# Patient Record
Sex: Female | Born: 1985
Health system: Southern US, Community
[De-identification: ages and names within clinical notes are randomized; demographics above are authoritative.]

## PROBLEM LIST (undated history)

## (undated) ENCOUNTER — Emergency Department (HOSPITAL_COMMUNITY): Payer: Medicaid Other

## (undated) DIAGNOSIS — C801 Malignant (primary) neoplasm, unspecified: Secondary | ICD-10-CM

## (undated) DIAGNOSIS — N3010 Interstitial cystitis (chronic) without hematuria: Secondary | ICD-10-CM

## (undated) DIAGNOSIS — F419 Anxiety disorder, unspecified: Secondary | ICD-10-CM

## (undated) DIAGNOSIS — Z8619 Personal history of other infectious and parasitic diseases: Secondary | ICD-10-CM

## (undated) DIAGNOSIS — IMO0002 Reserved for concepts with insufficient information to code with codable children: Secondary | ICD-10-CM

## (undated) DIAGNOSIS — N1370 Vesicoureteral-reflux, unspecified: Secondary | ICD-10-CM

## (undated) DIAGNOSIS — F410 Panic disorder [episodic paroxysmal anxiety] without agoraphobia: Secondary | ICD-10-CM

## (undated) DIAGNOSIS — T7840XA Allergy, unspecified, initial encounter: Secondary | ICD-10-CM

## (undated) DIAGNOSIS — T148XXA Other injury of unspecified body region, initial encounter: Secondary | ICD-10-CM

## (undated) DIAGNOSIS — F32A Depression, unspecified: Secondary | ICD-10-CM

## (undated) DIAGNOSIS — N390 Urinary tract infection, site not specified: Secondary | ICD-10-CM

## (undated) DIAGNOSIS — F4310 Post-traumatic stress disorder, unspecified: Secondary | ICD-10-CM

## (undated) DIAGNOSIS — R87619 Unspecified abnormal cytological findings in specimens from cervix uteri: Secondary | ICD-10-CM

## (undated) DIAGNOSIS — F429 Obsessive-compulsive disorder, unspecified: Secondary | ICD-10-CM

## (undated) DIAGNOSIS — N3289 Other specified disorders of bladder: Secondary | ICD-10-CM

## (undated) DIAGNOSIS — N730 Acute parametritis and pelvic cellulitis: Secondary | ICD-10-CM

## (undated) DIAGNOSIS — F329 Major depressive disorder, single episode, unspecified: Secondary | ICD-10-CM

## (undated) DIAGNOSIS — O039 Complete or unspecified spontaneous abortion without complication: Secondary | ICD-10-CM

## (undated) HISTORY — DX: Acute parametritis and pelvic cellulitis: N73.0

## (undated) HISTORY — DX: Urinary tract infection, site not specified: N39.0

## (undated) HISTORY — DX: Post-traumatic stress disorder, unspecified: F43.10

## (undated) HISTORY — PX: MELANOMA EXCISION: SHX5266

## (undated) HISTORY — DX: Depression, unspecified: F32.A

## (undated) HISTORY — DX: Malignant (primary) neoplasm, unspecified: C80.1

## (undated) HISTORY — DX: Obsessive-compulsive disorder, unspecified: F42.9

## (undated) HISTORY — DX: Personal history of other infectious and parasitic diseases: Z86.19

## (undated) HISTORY — DX: Interstitial cystitis (chronic) without hematuria: N30.10

## (undated) HISTORY — DX: Vesicoureteral-reflux, unspecified: N13.70

## (undated) HISTORY — DX: Allergy, unspecified, initial encounter: T78.40XA

## (undated) HISTORY — DX: Complete or unspecified spontaneous abortion without complication: O03.9

## (undated) HISTORY — PX: DILATION AND CURETTAGE OF UTERUS: SHX78

## (undated) HISTORY — DX: Other injury of unspecified body region, initial encounter: T14.8XXA

## (undated) HISTORY — DX: Anxiety disorder, unspecified: F41.9

## (undated) HISTORY — DX: Unspecified abnormal cytological findings in specimens from cervix uteri: R87.619

## (undated) HISTORY — DX: Major depressive disorder, single episode, unspecified: F32.9

## (undated) HISTORY — PX: LAPAROSCOPY: SHX197

## (undated) HISTORY — PX: TONSILLECTOMY: SUR1361

## (undated) HISTORY — DX: Reserved for concepts with insufficient information to code with codable children: IMO0002

---
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
## 2003-03-12 ENCOUNTER — Other Ambulatory Visit: Admission: RE | Admit: 2003-03-12 | Discharge: 2003-03-12 | Payer: Self-pay | Admitting: Obstetrics and Gynecology

## 2003-05-07 ENCOUNTER — Inpatient Hospital Stay (HOSPITAL_COMMUNITY): Admission: AD | Admit: 2003-05-07 | Discharge: 2003-05-10 | Payer: Self-pay | Admitting: Obstetrics and Gynecology

## 2003-05-08 ENCOUNTER — Encounter: Payer: Self-pay | Admitting: Obstetrics and Gynecology

## 2003-05-23 ENCOUNTER — Inpatient Hospital Stay (HOSPITAL_COMMUNITY): Admission: AD | Admit: 2003-05-23 | Discharge: 2003-05-23 | Payer: Self-pay | Admitting: Obstetrics and Gynecology

## 2003-05-25 ENCOUNTER — Inpatient Hospital Stay (HOSPITAL_COMMUNITY): Admission: AD | Admit: 2003-05-25 | Discharge: 2003-06-01 | Payer: Self-pay | Admitting: Obstetrics and Gynecology

## 2003-07-23 ENCOUNTER — Inpatient Hospital Stay (HOSPITAL_COMMUNITY): Admission: AD | Admit: 2003-07-23 | Discharge: 2003-07-23 | Payer: Self-pay | Admitting: Obstetrics and Gynecology

## 2003-07-23 ENCOUNTER — Encounter: Payer: Self-pay | Admitting: Obstetrics and Gynecology

## 2003-08-01 ENCOUNTER — Other Ambulatory Visit: Admission: RE | Admit: 2003-08-01 | Discharge: 2003-08-01 | Payer: Self-pay | Admitting: Obstetrics and Gynecology

## 2004-02-19 ENCOUNTER — Ambulatory Visit (HOSPITAL_COMMUNITY): Admission: RE | Admit: 2004-02-19 | Discharge: 2004-02-19 | Payer: Self-pay | Admitting: Urology

## 2004-05-12 ENCOUNTER — Other Ambulatory Visit: Admission: RE | Admit: 2004-05-12 | Discharge: 2004-05-12 | Payer: Self-pay | Admitting: Obstetrics and Gynecology

## 2004-06-13 ENCOUNTER — Ambulatory Visit (HOSPITAL_COMMUNITY): Admission: RE | Admit: 2004-06-13 | Discharge: 2004-06-13 | Payer: Self-pay | Admitting: Urology

## 2004-06-13 ENCOUNTER — Ambulatory Visit (HOSPITAL_BASED_OUTPATIENT_CLINIC_OR_DEPARTMENT_OTHER): Admission: RE | Admit: 2004-06-13 | Discharge: 2004-06-13 | Payer: Self-pay | Admitting: Urology

## 2004-06-13 ENCOUNTER — Encounter (INDEPENDENT_AMBULATORY_CARE_PROVIDER_SITE_OTHER): Payer: Self-pay | Admitting: *Deleted

## 2005-06-18 ENCOUNTER — Other Ambulatory Visit: Admission: RE | Admit: 2005-06-18 | Discharge: 2005-06-18 | Payer: Self-pay | Admitting: Obstetrics and Gynecology

## 2005-10-30 ENCOUNTER — Ambulatory Visit: Payer: Self-pay | Admitting: Family Medicine

## 2005-11-25 ENCOUNTER — Ambulatory Visit: Payer: Self-pay | Admitting: Cardiology

## 2005-12-14 ENCOUNTER — Ambulatory Visit: Payer: Self-pay | Admitting: Family Medicine

## 2006-02-12 ENCOUNTER — Ambulatory Visit: Payer: Self-pay | Admitting: Family Medicine

## 2006-03-02 ENCOUNTER — Ambulatory Visit: Payer: Self-pay | Admitting: Family Medicine

## 2006-07-28 ENCOUNTER — Ambulatory Visit: Payer: Self-pay | Admitting: Family Medicine

## 2006-12-28 ENCOUNTER — Ambulatory Visit: Payer: Self-pay | Admitting: Family Medicine

## 2007-03-22 ENCOUNTER — Encounter: Payer: Self-pay | Admitting: Family Medicine

## 2007-03-22 ENCOUNTER — Ambulatory Visit: Payer: Self-pay | Admitting: Family Medicine

## 2007-03-22 DIAGNOSIS — J309 Allergic rhinitis, unspecified: Secondary | ICD-10-CM | POA: Insufficient documentation

## 2007-03-22 DIAGNOSIS — F329 Major depressive disorder, single episode, unspecified: Secondary | ICD-10-CM | POA: Insufficient documentation

## 2007-03-22 DIAGNOSIS — Z85828 Personal history of other malignant neoplasm of skin: Secondary | ICD-10-CM | POA: Insufficient documentation

## 2007-04-12 ENCOUNTER — Telehealth (INDEPENDENT_AMBULATORY_CARE_PROVIDER_SITE_OTHER): Payer: Self-pay | Admitting: *Deleted

## 2007-04-13 ENCOUNTER — Ambulatory Visit: Payer: Self-pay | Admitting: Family Medicine

## 2007-06-02 ENCOUNTER — Telehealth (INDEPENDENT_AMBULATORY_CARE_PROVIDER_SITE_OTHER): Payer: Self-pay | Admitting: *Deleted

## 2007-06-03 ENCOUNTER — Ambulatory Visit: Payer: Self-pay | Admitting: Internal Medicine

## 2007-06-12 ENCOUNTER — Emergency Department: Payer: Self-pay | Admitting: Unknown Physician Specialty

## 2007-07-18 ENCOUNTER — Ambulatory Visit: Payer: Self-pay | Admitting: Family Medicine

## 2007-08-15 ENCOUNTER — Telehealth (INDEPENDENT_AMBULATORY_CARE_PROVIDER_SITE_OTHER): Payer: Self-pay | Admitting: *Deleted

## 2007-08-16 ENCOUNTER — Encounter: Payer: Self-pay | Admitting: Family Medicine

## 2007-08-26 ENCOUNTER — Ambulatory Visit: Payer: Self-pay | Admitting: Family Medicine

## 2007-08-26 LAB — CONVERTED CEMR LAB
Bilirubin Urine: NEGATIVE
Urobilinogen, UA: 0.2
WBC Urine, dipstick: NEGATIVE
pH: 6.5

## 2007-08-27 ENCOUNTER — Encounter: Payer: Self-pay | Admitting: Family Medicine

## 2007-12-06 ENCOUNTER — Ambulatory Visit: Payer: Self-pay | Admitting: Family Medicine

## 2008-01-04 ENCOUNTER — Ambulatory Visit: Payer: Self-pay | Admitting: Family Medicine

## 2008-01-04 ENCOUNTER — Other Ambulatory Visit: Admission: RE | Admit: 2008-01-04 | Discharge: 2008-01-04 | Payer: Self-pay | Admitting: Family Medicine

## 2008-01-04 ENCOUNTER — Encounter: Payer: Self-pay | Admitting: Family Medicine

## 2008-03-19 ENCOUNTER — Ambulatory Visit: Payer: Self-pay | Admitting: Family Medicine

## 2008-04-12 ENCOUNTER — Ambulatory Visit: Payer: Self-pay | Admitting: Family Medicine

## 2008-04-12 LAB — CONVERTED CEMR LAB: Rapid Strep: NEGATIVE

## 2008-04-23 ENCOUNTER — Telehealth (INDEPENDENT_AMBULATORY_CARE_PROVIDER_SITE_OTHER): Payer: Self-pay | Admitting: *Deleted

## 2008-04-24 ENCOUNTER — Ambulatory Visit: Payer: Self-pay | Admitting: Family Medicine

## 2008-05-03 ENCOUNTER — Ambulatory Visit: Payer: Self-pay | Admitting: Family Medicine

## 2008-05-03 LAB — CONVERTED CEMR LAB: KOH Prep: NEGATIVE

## 2008-05-07 LAB — CONVERTED CEMR LAB: GC Probe Amp, Genital: NEGATIVE

## 2008-08-20 ENCOUNTER — Ambulatory Visit: Payer: Self-pay | Admitting: Family Medicine

## 2008-09-17 LAB — CONVERTED CEMR LAB: GC Probe Amp, Genital: NEGATIVE

## 2008-12-12 ENCOUNTER — Encounter: Payer: Self-pay | Admitting: Family Medicine

## 2009-01-29 ENCOUNTER — Ambulatory Visit: Payer: Self-pay | Admitting: Family Medicine

## 2009-04-25 ENCOUNTER — Ambulatory Visit: Payer: Self-pay | Admitting: Family Medicine

## 2009-04-25 LAB — CONVERTED CEMR LAB: Rapid Strep: NEGATIVE

## 2009-05-06 ENCOUNTER — Ambulatory Visit: Payer: Self-pay | Admitting: Family Medicine

## 2009-05-06 LAB — CONVERTED CEMR LAB
Beta hcg, urine, semiquantitative: NEGATIVE
Glucose, Urine, Semiquant: NEGATIVE
Ketones, urine, test strip: NEGATIVE
Nitrite: NEGATIVE
Protein, U semiquant: 30

## 2009-05-08 LAB — CONVERTED CEMR LAB: Chlamydia, DNA Probe: NEGATIVE

## 2009-05-21 ENCOUNTER — Ambulatory Visit: Payer: Self-pay | Admitting: Family Medicine

## 2009-06-03 ENCOUNTER — Ambulatory Visit: Payer: Self-pay | Admitting: Family Medicine

## 2009-06-28 ENCOUNTER — Ambulatory Visit: Payer: Self-pay | Admitting: Family Medicine

## 2009-06-28 LAB — CONVERTED CEMR LAB: Rapid Strep: NEGATIVE

## 2009-08-26 ENCOUNTER — Encounter: Payer: Self-pay | Admitting: Family Medicine

## 2009-08-26 ENCOUNTER — Ambulatory Visit: Payer: Self-pay | Admitting: Family Medicine

## 2009-08-27 ENCOUNTER — Telehealth (INDEPENDENT_AMBULATORY_CARE_PROVIDER_SITE_OTHER): Payer: Self-pay | Admitting: *Deleted

## 2009-09-30 ENCOUNTER — Ambulatory Visit: Payer: Self-pay | Admitting: Internal Medicine

## 2009-09-30 LAB — CONVERTED CEMR LAB
Bilirubin Urine: NEGATIVE
Glucose, Urine, Semiquant: NEGATIVE
Ketones, urine, test strip: NEGATIVE
Protein, U semiquant: NEGATIVE
Specific Gravity, Urine: 1.02

## 2009-12-10 ENCOUNTER — Ambulatory Visit: Payer: Self-pay | Admitting: Family Medicine

## 2009-12-10 LAB — CONVERTED CEMR LAB
Basophils Absolute: 0 10*3/uL (ref 0.0–0.1)
Eosinophils Relative: 0.3 % (ref 0.0–5.0)
Monocytes Absolute: 0.3 10*3/uL (ref 0.1–1.0)
Monocytes Relative: 7.8 % (ref 3.0–12.0)
Neutrophils Relative %: 64.2 % (ref 43.0–77.0)
Platelets: 223 10*3/uL (ref 150.0–400.0)
WBC: 3.7 10*3/uL — ABNORMAL LOW (ref 4.5–10.5)

## 2010-01-24 ENCOUNTER — Ambulatory Visit: Payer: Self-pay | Admitting: Family Medicine

## 2010-01-24 LAB — CONVERTED CEMR LAB
Beta hcg, urine, semiquantitative: NEGATIVE
Bilirubin Urine: NEGATIVE
Glucose, Urine, Semiquant: NEGATIVE
KOH Prep: NEGATIVE
Ketones, urine, test strip: NEGATIVE
Specific Gravity, Urine: 1.015
Urobilinogen, UA: 0.2
Whiff Test: NEGATIVE
Yeast, UA: 0
pH: 6

## 2010-01-25 ENCOUNTER — Encounter: Payer: Self-pay | Admitting: Family Medicine

## 2010-03-17 ENCOUNTER — Ambulatory Visit: Payer: Self-pay | Admitting: Family Medicine

## 2010-03-19 ENCOUNTER — Telehealth: Payer: Self-pay | Admitting: Family Medicine

## 2010-03-19 ENCOUNTER — Encounter: Payer: Self-pay | Admitting: Family Medicine

## 2010-04-15 ENCOUNTER — Encounter: Payer: Self-pay | Admitting: Family Medicine

## 2010-06-03 ENCOUNTER — Ambulatory Visit: Payer: Self-pay | Admitting: Family Medicine

## 2010-06-03 LAB — CONVERTED CEMR LAB: Rapid Strep: NEGATIVE

## 2010-06-25 ENCOUNTER — Encounter (INDEPENDENT_AMBULATORY_CARE_PROVIDER_SITE_OTHER): Payer: Self-pay | Admitting: *Deleted

## 2010-06-25 ENCOUNTER — Ambulatory Visit: Payer: Self-pay | Admitting: Family Medicine

## 2010-06-25 DIAGNOSIS — N3010 Interstitial cystitis (chronic) without hematuria: Secondary | ICD-10-CM | POA: Insufficient documentation

## 2010-06-25 LAB — CONVERTED CEMR LAB
Ketones, urine, test strip: NEGATIVE
Nitrite: NEGATIVE
RBC / HPF: 0
Urobilinogen, UA: 0.2
WBC Urine, dipstick: NEGATIVE

## 2010-06-26 ENCOUNTER — Encounter: Payer: Self-pay | Admitting: Family Medicine

## 2010-06-26 LAB — CONVERTED CEMR LAB
Chlamydia, DNA Probe: NEGATIVE
GC Probe Amp, Genital: NEGATIVE

## 2010-07-22 ENCOUNTER — Ambulatory Visit: Payer: Self-pay | Admitting: Family Medicine

## 2010-08-21 ENCOUNTER — Ambulatory Visit: Payer: Self-pay | Admitting: Internal Medicine

## 2010-08-21 ENCOUNTER — Encounter: Payer: Self-pay | Admitting: Family Medicine

## 2010-09-23 ENCOUNTER — Ambulatory Visit: Payer: Self-pay | Admitting: Family Medicine

## 2010-09-23 LAB — CONVERTED CEMR LAB
Casts: 0 /lpf
Glucose, Urine, Semiquant: NEGATIVE
Nitrite: NEGATIVE
Specific Gravity, Urine: 1.02
Yeast, UA: 0
pH: 6

## 2010-09-24 ENCOUNTER — Encounter: Payer: Self-pay | Admitting: Family Medicine

## 2010-11-20 ENCOUNTER — Ambulatory Visit
Admission: RE | Admit: 2010-11-20 | Discharge: 2010-11-20 | Payer: Self-pay | Source: Home / Self Care | Attending: Family Medicine | Admitting: Family Medicine

## 2010-11-20 ENCOUNTER — Encounter: Payer: Self-pay | Admitting: Family Medicine

## 2010-12-23 ENCOUNTER — Encounter: Payer: Self-pay | Admitting: Family Medicine

## 2010-12-23 NOTE — Letter (Signed)
Summary: Out of Work  [facility]  [address] [place], [place] [address]   Phone: [phone]  Fax: [phone]    August 21, 2010   Employee:  EVANA RUNNELS    To Whom It May Concern:   For Medical reasons, please excuse the above named employee from work for the following dates:  Start:  August 21, 2010   End:  August 21, 2010   If you need additional information, please feel free to contact our office.         Sincerely,    Eustaquio Boyden  MD

## 2010-12-23 NOTE — Letter (Signed)
Summary: Out of Work  [facility]  [address] [place], [place] [address]   Phone: [phone]  Fax: [phone]    June 03, 2010   Employee:  GIOVANNA KEMMERER    To Whom It May Concern:   For Medical reasons, please excuse the above named employee from work for the following dates:  Start:   06/03/2010  End:   can return 7/13/ 2011 if feeling better   If you need additional information, please feel free to contact our office.         Sincerely,    Judith Part MD

## 2010-12-23 NOTE — Assessment & Plan Note (Signed)
Summary: ST,HA/CLE   Vital Signs:  Patient profile:   25 year old female Height:      68 inches Weight:      155.75 pounds BMI:     23.77 Temp:     97.9 degrees F oral Pulse rate:   84 / minute Pulse rhythm:   regular BP sitting:   106 / 72  (left arm) Cuff size:   regular  Vitals Entered By: Lewanda Rife LPN (June 03, 2010 12:04 PM) CC: headache, sorethroat, face hurts and productive cough with yellow and green mucus   History of Present Illness: last sunday started getting sick with sneezing and bad throat pain (10 days ago) tried mucinex dm and alka selzer severe  now is getting sinus pain under her L eye  no fever  green nasal d/c with bad cough that is slt productive  no wheezing   some nausea last week- that is better  is still a non smoker    Allergies: 1)  ! Paxil Cr (Paroxetine Hcl) 2)  ! Zyrtec Allergy 3)  ! 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 Eyes:  Denies blurring and eye irritation. ENT:  Complains of earache, nasal congestion, postnasal drainage, sinus pressure, and sore throat. CV:  Denies chest pain or discomfort and palpitations. Resp:  Complains of cough and sputum productive; denies pleuritic, shortness of breath, and wheezing. GI:   Complains of nausea; denies diarrhea and vomiting. Derm:  Denies lesion(s), poor wound healing, and rash. Heme:  Denies enlarge lymph nodes.  Physical Exam  General:  fatigued but well appearing female Head:  normocephalic, atraumatic, and no abnormalities observed.  bilat ethmoid and L maxillary sinus tenderness Eyes:  vision grossly intact, pupils equal, pupils round, pupils reactive to light, and no injection.   Ears:  R ear normal and L ear normal.   Nose:  nares congested and injected bilat  Mouth:  pharynx pink and moist, no erythema, and no exudates.  some clear post nasal drip noted  Neck:  No deformities, masses, or tenderness noted. Lungs:  Normal respiratory effort, chest expands symmetrically. Lungs are clear to auscultation, no crackles or wheezes. Heart:  Normal rate and regular rhythm. S1 and S2 normal without gallop, murmur, click, rub or other extra sounds. Skin:  Intact without suspicious lesions or rashes Cervical Nodes:  No lymphadenopathy noted Psych:  normal affect, talkative and pleasant    Impression & Recommendations:  Problem # 1:  SINUSITIS - ACUTE-NOS (ICD-461.9) Assessment New  10 days s/p uri with congestion and facial pain bad sore throat from drainage- rapid strep neg will tx with augmentin (warnings given about interaction with oc) recommend sympt care- see pt instructions   pt advised to update me if symptoms worsen or do not improve - esp facial pain Her updated medication  list for this problem includes:    Augmentin 875-125 Mg Tabs (Amoxicillin-pot clavulanate) .Marland Kitchen... 1 by mouth two times a day for 10 days  Orders: Prescription Created Electronically [phone]) Rapid Strep (60[address])  Complete Medication List: 1)  Elmiron 100 Mg Caps (Pentosan polysulfate sodium) .... Take 2 by mouth at bedtime 2)  Hydroxyzine Hcl 25 Mg Tabs (Hydroxyzine hcl) .... Take one by mouth daily 3)  Seasonique 0.15-0.03 &0.01 Mg Tabs (Levonorgest-eth estrad 91-day) .... As  directed 4)  Ultram 50 Mg Tabs (Tramadol hcl) .Marland Kitchen.. 1 by mouth up to three times a day as needed pain 5)  Augmentin 875-125 Mg Tabs (Amoxicillin-pot clavulanate) .Marland Kitchen.. 1 by mouth two times a day for 10 days  Patient Instructions: 1)  take the augmentin for sinus infection 2)  you can try mucinex over the counter twice daily as directed and nasal saline spray for congestion 3)  tylenol over the counter as directed may help with aches, headache and fever 4)  call if symptoms worsen or if not improved in 4-5 days  5)  drink lots of fluids and get rest  6)  I sent your px to the pharmacy Prescriptions: AUGMENTIN 875-125 MG TABS (AMOXICILLIN-POT CLAVULANATE) 1 by mouth two times a day for 10 days  #20 x 0   Entered and Authorized by:   Judith Part MD   Signed by:   Judith Part MD on 06/03/2010   Method used:   Electronically to        [facility]* (retail)       [address]       [place], [place]  [address]       Ph: [phone]       Fax: [phone]   RxID:   [id number]   Current Allergies (reviewed today): ! PAXIL CR (PAROXETINE HCL) ! ZYRTEC ALLERGY ! * Evangeline Dakin  Laboratory Results  Date/Time Received: June 03, 2010 12:05 PM  Date/Time Reported: June 03, 2010 12:05 PM   Other Tests  Rapid Strep: negative  Kit Test Internal QC: Positive   (Normal Range: Negative)

## 2010-12-23 NOTE — Assessment & Plan Note (Signed)
Summary: COUGH/CLE   Vital Signs:  Patient profile:   25 year old female Weight:      153.75 pounds Temp:     98.1 degrees F oral Pulse rate:   116 / minute Pulse rhythm:   regular BP sitting:   122 / 78  (left arm) Cuff size:   regular  Vitals Entered By: Selena Batten Dance CMA Duncan Dull) (August 21, 2010 3:25 PM) CC: Cough/sinus drainage x4 days   History of Present Illness: CC: cough/sinus drainage  4d h/o ear pain, mainly left side. Cough started 2-3 days ago - worse at night and early AM.  + fever to 101.  + sinus pressure and congestion, clear nasal drainage.  Bending head forward makes pain worse.  + ST.  + vomiting this am from severe coughing.  + dizzy from sinuses.  Has tried tylenol sinus.    Works with toddlers.  + mom smokes at home.  + sinus infections frequently.  + sinus issues in past, + allergic rhinitis.  no asthma.  No stool changes.  Staying well hydrated.    treated last month for sinus infection with augmentin x 10 days.  felt better.    Current Medications (verified): 1)  Elmiron 100 Mg Caps (Pentosan Polysulfate Sodium) .... Take 2 By Mouth At Bedtime 2)  Hydroxyzine Hcl 25 Mg Tabs (Hydroxyzine Hcl) .... Take One By Mouth Daily 3)  Seasonique 0.15-0.03 &0.01 Mg Tabs (Levonorgest-Eth Estrad 91-Day) .... As Directed  Allergies: 1)  ! Paxil Cr (Paroxetine Hcl) 2)  ! Zyrtec Allergy 3)  ! 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   Eyes:  vision grossly intact, pupils equal, pupils round, and pupils reactive to light.   Ears:  R ear normal and L ear normal.   Nose:  nares are injected and congested bilaterally, clear discharge Mouth:  post erythema and clear post nasal drip Neck:  R AC LAD Lungs:  Normal respiratory effort, chest expands symmetrically. Lungs are clear to auscultation, no crackles or wheezes. Heart:  Normal rate and regular rhythm. S1 and S2 normal without gallop, murmur, click, rub or other extra sounds. Abdomen:  Bowel sounds positive,abdomen soft and non-tender without masses, organomegaly or hernias noted. Pulses:  2+ rad pulses Extremities:  no edema Skin:  Intact without suspicious lesions or rashes   Impression & Recommendations:  Problem # 1:  SINUSITIS - ACUTE-NOS (ICD-461.9) Instructed on treatment. Call if symptoms persist or worsen.  longer course of augmentin (2wks).  rpt sinusitis.  red flags to return discussed.  discussed decreased effectiveness of OCPs while on abx, to use 2nd form of BC.  The following medications were removed from the medication list:    Augmentin 875-125 Mg Tabs (Amoxicillin-pot clavulanate) .Marland Kitchen... 1 by mouth two times a day  for 10 days Her updated medication list for this problem includes:    Augmentin 875-125 Mg Tabs (Amoxicillin-pot clavulanate) .Marland Kitchen... Take one by mouth two times a day  Complete Medication List: 1)  Elmiron 100 Mg Caps (Pentosan polysulfate sodium) .... Take 2 by mouth at bedtime 2)  Hydroxyzine Hcl 25 Mg Tabs (Hydroxyzine hcl) .... Take one by mouth daily 3)  Seasonique 0.15-0.03 &0.01 Mg Tabs (Levonorgest-eth estrad 91-day) .... As directed 4)  Augmentin 875-125 Mg Tabs (Amoxicillin-pot clavulanate) .... Take  one by mouth two times a day  Patient Instructions: 1)  You have a sinus infection. 2)  Take medicines as prescribed:  Augmentin twice daily for 2 weeks. 3)  Take guaifenesin 400mg  IR 1 1/2 pills in am and at noon with plenty of fluid to help mobilize mucous. 4)  Use nasal saline spray or neti pot to help drainage of sinuses. 5)  If you start having fevers >101.5, trouble swallowing or breathing, or are worsening instead of improving as expected, you may need to be seen again. 6)  Good to see you today, call clinic with questions.  Prescriptions: AUGMENTIN 875-125 MG TABS (AMOXICILLIN-POT CLAVULANATE) take one by mouth two times a day  #28 x 0   Entered and Authorized by:   Eustaquio Boyden  MD   Signed by:   Eustaquio Boyden  MD on 08/21/2010   Method used:   Electronically to        [facility]* (retail)       [address]       [place], [place]  [address]       Ph: [phone]       Fax: [phone]   RxID:   [id number]   Current Allergies (reviewed today): ! PAXIL CR (PAROXETINE HCL) ! ZYRTEC ALLERGY ! * TUSSINEX

## 2010-12-23 NOTE — Assessment & Plan Note (Signed)
Summary: 10:45 FEVER,BODY ACHES/CLE   Vital Signs:  Patient profile:   25 year old female Weight:      159 pounds Temp:     98.1 degrees F oral Pulse rate:   88 / minute Pulse rhythm:   regular BP sitting:   100 / 62  (left arm) Cuff size:   regular  Vitals Entered By: Lowella Petties CMA (December 10, 2009 11:05 AM) CC: Fevers, achy, sore throat, nausea x 3 days.  Has rash on hands and feet.  She has been taking amox x 1 week for bacterial infection.   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 if flu   did not get flu shot this year        Allergies: 1)  ! Paxil Cr (Paroxetine Hcl) 2)  ! Zyrtec Allergy 3)  ! 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 Eyes:  Denies blurring and discharge. ENT:   Complains of nasal congestion, postnasal drainage, sinus pressure, and sore throat; denies earache. CV:  Denies chest pain or discomfort and palpitations. Resp:  Complains of cough; denies pleuritic, shortness of breath, sputum productive, and wheezing. GI:  Complains of nausea; denies abdominal pain and diarrhea. GU:  Denies dysuria, hematuria, and urinary frequency. MS:  Complains of muscle aches; denies joint redness and joint swelling. Derm:  Complains of rash; denies itching. Endo:  Denies excessive thirst and excessive urination. Heme:  Denies abnormal bruising, bleeding, and enlarge lymph nodes.  Physical Exam  General:  fatigued and tearful - easily consoled  Head:  normocephalic, atraumatic, and no abnormalities observed.  no sinus tenderness  Eyes:  vision grossly intact, pupils equal, pupils round, pupils reactive to light, and no injection.   Ears:  R ear normal and L ear normal.   Nose:  nares congested with clear rhinorrhea  Mouth:  MMM, throat clear no petichiae or mucosal change  Neck:  No deformities, masses, or tenderness noted. supple/ no meningeal signs  Chest Wall:  No deformities, masses, or tenderness noted. Lungs:  Normal respiratory effort, chest expands symmetrically. Lungs are clear to auscultation, no crackles or wheezes. upper airway congestion noted  Heart:  Normal rate and regular rhythm. S1 and S2 normal without gallop, murmur,  click, rub or other extra sounds. Abdomen:  Bowel sounds positive,abdomen soft and non-tender without masses, organomegaly or hernias noted.  Msk:  No deformity or scoliosis noted of thoracic or lumbar spine.  no acute joint changes  Extremities:  No clubbing, cyanosis, edema, or deformity noted with normal full range of motion of all joints.   Skin:  faint petichial type rash -fingers and toes no other rash Cervical Nodes:  No lymphadenopathy noted Inguinal Nodes:  No significant adenopathy Psych:  normal affect, talkative and  pleasant    Impression & Recommendations:  Problem # 1:  INFLUENZA, WITH RESPIRATORY SYMPTOMS (ICD-487.1)  also nausea and cough  tx with tamiflu  in light of skin change - check cbc with diff also  update if not imp recommend sympt care- see pt instructions  - handout given aafp  phenergen Im 50mg  for nausea - adv to update if any further vomiting  switch to ES tylenol q 4 h for fever   Orders: Promethazine up to 50mg  (J2550) Admin of Therapeutic Inj  intramuscular or subcutaneous (60109)  Problem # 2:  EXANTHEM (ICD-782.1) some faint peteichial like skin change on fingers/toes - not pruritic no brusing or past hx  check cbc with diff  Orders: TLB-CBC Platelet - w/Differential (85025-CBCD) Venipuncture (32355)  Complete Medication List: 1)  Elmiron 100 Mg Caps (Pentosan polysulfate sodium) .... Take 2 by mouth at bedtime 2)  Hydroxyzine Hcl 25 Mg Tabs (Hydroxyzine hcl) .... Take one by mouth daily 3)  Seasonique 0.15-0.03 &0.01 Mg Tabs (Levonorgest-eth estrad 91-day) .... As directed 4)  Tamiflu 75 Mg Caps (Oseltamivir phosphate) .Marland Kitchen.. 1 by mouth two times a day for 5 days 5)  Amoxicillin 500 Mg Caps (Amoxicillin) .Marland Kitchen.. 1 by mouth two times a day  Patient Instructions: 1)  phenergan shot now for nausea -- this will make you sleepy  2)  update me if worse or any trouble breathing  3)  labs today- will update you  4)  take ES tylenol -- 2 pills every 4 hours  5)  take the tamiflu as directed  6)  continue amox  Prescriptions: TAMIFLU 75 MG CAPS (OSELTAMIVIR PHOSPHATE) 1 by mouth two times a day for 5 days  #10 x 0   Entered and Authorized by:   Judith Part MD   Signed by:   Judith Part MD on 12/10/2009   Method used:   Electronically to        [facility]* (retail)       [address]       [place], [place]  [address]       Ph: [phone]       Fax: [phone]   RxID:   [id number]   Prior Medications (reviewed today): ELMIRON  100 MG CAPS (PENTOSAN POLYSULFATE SODIUM) Take 2 by mouth at bedtime HYDROXYZINE HCL 25 MG TABS (HYDROXYZINE HCL) Take one by mouth daily SEASONIQUE 0.15-0.03 &0.01 MG TABS (LEVONORGEST-ETH ESTRAD 91-DAY) as directed Current Allergies: ! PAXIL CR (PAROXETINE HCL) ! ZYRTEC ALLERGY ! * TUSSINEX  Medication Administration  Injection # 1:    Medication: Promethazine up to 50mg     Diagnosis: INFLUENZA, WITH RESPIRATORY SYMPTOMS (ICD-487.1)    Route: IM    Site: RUOQ gluteus    Exp Date: 02/2010    Lot #: [id number]    Mfr: [email or web address]    Patient tolerated injection without complications    Given by: Lowella Petties CMA (  December 10, 2009 11:53 AM)  Orders Added: 1)  TLB-CBC Platelet - w/Differential [85025-CBCD] 2)  Venipuncture [[address]] 3)  Promethazine up to 50mg  [J2550] 4)  Admin of Therapeutic Inj  intramuscular or subcutaneous [96372] 5)  Est. Patient Level III [91478]

## 2010-12-23 NOTE — Letter (Signed)
Summary: Out of Work  [facility] at [facility]  [address] [place], [place] [address]   Phone: [phone]  Fax: [phone]    July 22, 2010   Employee:  KYMBERLEY RAZ    To Whom It May Concern:   For Medical reasons, please excuse the above named employee from work for the following dates:  Start:   07/22/2010  End:   07/23/2010  If you need additional information, please feel free to contact our office.         Sincerely,    Judith Part MD

## 2010-12-23 NOTE — Assessment & Plan Note (Signed)
Summary: uti/alc   Vital Signs:  Patient profile:   25 year old female Height:      68 inches Weight:      154.8 pounds BMI:     23.62 Temp:     98.1 degrees F oral Pulse rate:   84 / minute Pulse rhythm:   regular BP sitting:   100 / 64  (left arm) Cuff size:   regular  Vitals Entered By: Benny Lennert CMA Duncan Dull) (June 25, 2010 10:40 AM)  History of Present Illness: Chief complaint UTI   2 days ago  left lower abdomen...has hx of interstitial cystitis (on elmiron, hydroxizine..fairly well controlled on these usually)  Dysuria, increase in urinary frequency  No blood seen  in urine Occ shooting pains down left groin and left low  back.No CVA tenderness.   IC generally cause left groin pain and low back pain...but it is 10 x worse now.  Has hx of PID.   Last pelvic exam was in June ... neg GC/Chlam. No new sexual partner. :Last menes...3 months ago on seasonique.   Problems Prior to Update: 1)  Interstitial Cystitis  (ICD-595.1) 2)  ? of Acute Cystitis  (ICD-595.0) 3)  Sinusitis - Acute-nos  (ICD-461.9) 4)  Pelvic Pain  (ICD-789.09) 5)  Sexually Transmitted Disease, Exposure To  (ICD-V01.6) 6)  Examination, Medical Nec, Admn Purposes  (ICD-V70.3) 7)  Herpes Zoster  (ICD-053.9) 8)  Urinary Tract Infection, Hx of  (ICD-V13.00) 9)  Depression  (ICD-311) 10)  Skin Cancer, Hx of  (ICD-V10.83) 11)  Allergic Rhinitis  (ICD-477.9)  Current Medications (verified): 1)  Elmiron 100 Mg Caps (Pentosan Polysulfate Sodium) .... Take 2 By Mouth At Bedtime 2)  Hydroxyzine Hcl 25 Mg Tabs (Hydroxyzine Hcl) .... Take One By Mouth Daily 3)  Seasonique 0.15-0.03 &0.01 Mg Tabs (Levonorgest-Eth Estrad 91-Day) .... As Directed  Allergies: 1)  ! Paxil Cr (Paroxetine Hcl) 2)  ! Zyrtec Allergy 3)  ! * Tussinex  Past History:  Past medical, surgical, family and social histories (including risk factors) reviewed, and no changes noted (except as noted below).  Past Medical  History: Reviewed history from 03/19/2008 and no changes required. Allergic rhinitis Skin cancer, hx of- melanoma Depression hx of kidney reflux as a child hx of PID endometriosis IC  Past Surgical History: Reviewed history from 04/25/2009 and no changes required. tonsillectomy abn paps, cryosx sx for melanoma R leg PID mult hosp admissions missed ab laparoscopy pelvic 12/06 CT sinuses neg fx R foot--04/13/2009  Family History: Reviewed history from 03/22/2007 and no changes required. MGM breast ca  Social History: Reviewed history from 05/03/2008 and no changes required. 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 CV:  Denies chest pain or discomfort. Resp:  Denies shortness of breath. GI:  Denies bloody stools, constipation, diarrhea, nausea, and vomiting. GU:  Denies abnormal vaginal bleeding and hematuria.  Physical Exam  General:  Well-developed,well-nourished,in no acute distress; alert,appropriate and cooperative throughout examination Mouth:  Oral mucosa and oropharynx without lesions or exudates.  Teeth in good repair. Neck:  no cervical or supraclavicular lymphadenopathy  Lungs:  Normal respiratory effort, chest expands symmetrically. Lungs are clear to auscultation, no crackles or wheezes. Heart:  Normal rate and regular rhythm. S1 and S2 normal without gallop, murmur, click, rub or other extra sounds. Abdomen:  Significant ttp B lower abdomen  Left  anterior thigh ttp  Rectal:  No external abnormalities noted.  Genitalia:  Severe ttp at cervix, over utrus/bladder and left adenexa, less tenderness, but still present at right adenexa. normal introitus, no vaginal discharge, mucosa pink and moist, and no vaginal or cervical lesions.   Msk:  neg Faber's Neg SLR ttp diffuse low back.  Skin:  Intact without suspicious lesions or rashes   Impression &  Recommendations:  Problem # 1:  PELVIC  PAIN (ICD-789.09) Concerning for PID...will send GC/Chlam  No clear sign of bacterial acute cystitis, but will send for culture to verify.  U preg neg.  Pain may also be due to an IC flare...recommended her to call her URO for any recomendtions on treatment.  Will obtain an [place] to eval for ovarian cyst rupture, TOA or torsion.  Go to ER if severe pain, fever or not tolerating oral fluids. Start ibuprofen for pain.  The following medications were removed from the medication list:    Ultram 50 Mg Tabs (Tramadol hcl) .Marland Kitchen... 1 by mouth up to three times a day as needed pain  Orders: Urine Pregnancy Test  (06301) T-Chlamydia Probe, genital (60109-32355) T-GC Probe, genital (73220-25427) Specimen Handling (06237)  Problem # 2:  ? of ACUTE CYSTITIS (ICD-595.0)  The following medications were removed from the medication list:    Augmentin 875-125 Mg Tabs (Amoxicillin-pot clavulanate) .Marland Kitchen... 1 by mouth two times a day for 10 days  Orders: UA Dipstick W/ Micro (manual) (62831) T-Culture, Urine ([id number])  Problem # 3:  INTERSTITIAL CYSTITIS (ICD-595.1)  Complete Medication List: 1)  Elmiron 100 Mg Caps (Pentosan polysulfate sodium) .... Take 2 by mouth at bedtime 2)  Hydroxyzine Hcl 25 Mg Tabs (Hydroxyzine hcl) .... Take one by mouth daily 3)  Seasonique 0.15-0.03 &0.01 Mg Tabs (Levonorgest-eth estrad 91-day) .... As directed  Patient Instructions: 1)  Contact urologist for possible IC flare. 2)  Urine looks clear..will send for culture to verify.  3)   Gonnorhea and chlamydia test pending.  4)  Urine pregnancy negative. 5)  Use ibuprofen 800 mg every eight hours.  Current Allergies (reviewed today): ! PAXIL CR (PAROXETINE HCL) ! ZYRTEC ALLERGY ! * TUSSINEX  Laboratory Results   Urine Tests  Date/Time Received: June 25, 2010 10:59 AM  Date/Time Reported: June 25, 2010 10:59 AM   Routine Urinalysis   Color: yellow Appearance:  Clear Glucose: negative   (Normal Range: Negative) Bilirubin: negative   (Normal Range: Negative) Ketone: negative   (Normal Range: Negative) Spec. Gravity: 1.025   (Normal Range: 1.003-1.035) Blood: small   (Normal Range: Negative) pH: 5.0   (Normal Range: 5.0-8.0) Protein: trace   (Normal Range: Negative) Urobilinogen: 0.2   (Normal Range: 0-1) Nitrite: negative   (Normal Range: Negative) Leukocyte Esterace: negative   (Normal Range: Negative)  Urine Microscopic WBC/HPF: 0 RBC/HPF: 0 Epithelial/HPF: many Casts/LPF: none

## 2010-12-23 NOTE — Assessment & Plan Note (Signed)
Summary: NAUSEAS/BLADDER INFECTION/DLO   Vital Signs:  Patient profile:   25 year old female Height:      68 inches Weight:      155.75 pounds BMI:     23.77 Temp:     97.8 degrees F oral Pulse rate:   92 / minute Pulse rhythm:   regular BP sitting:   110 / 76  (left arm) Cuff size:   regular  Vitals Entered By: Lewanda Rife LPN (September 23, 2010 3:50 PM) CC: nausea, ?bladder infection, frequency of urination and void small amt., pain upon urination and bladder and low back pain. Pain level now is 8.   History of Present Illness: bladder spasms worse for the past 2 weeks drank soda this weekend (not in a while)  pain started sunday am  got worse and nauseated last night too   pain is in lower abd/ pelvic -- and in her lower back  no cva pain  thought she had a fever this am no blood in her urine   is drinking lots of water  missed 1 birth control pill one month ago   no worries at all about stds  no vag d.c or bleeding   Allergies: 1)  ! Paxil Cr (Paroxetine Hcl) 2)  ! Zyrtec Allergy 3)  ! 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 ENT:  Denies sore throat. CV:  Denies chest pain or discomfort, lightheadness, and shortness of breath with exertion. Resp:  Denies cough and wheezing. GI:   Complains of abdominal pain and nausea; denies bloody stools, change in bowel habits, diarrhea, indigestion, and vomiting. GU:  Complains of dysuria and urinary frequency; denies abnormal vaginal bleeding and hematuria. MS:  Complains of low back pain. Derm:  Denies rash.  Physical Exam  General:  uncomfortable / fatigued appearing  Head:  normocephalic, atraumatic, and no abnormalities observed.   Mouth:  pharynx pink and moist.   Neck:  No deformities, masses, or tenderness noted. Lungs:  Normal respiratory effort, chest expands symmetrically. Lungs are clear to auscultation, no crackles or wheezes. Heart:  Normal rate and regular rhythm. S1 and S2 normal without gallop, murmur, click, rub or other extra sounds. Abdomen:  tender suprapubically  mild lower L flank tenderness no rebound or gaurding  no distention Msk:  no cva tenderness  Skin:  Intact without suspicious lesions or rashes Cervical Nodes:  No lymphadenopathy noted Inguinal Nodes:  No significant adenopathy Psych:  nl affect    Impression & Recommendations:  Problem # 1:  UTI (ICD-599.0) Assessment New with some L flank pain -- disc poss of early kidney infection tx with high dose cipro 7 d for nausea - phenergan 50 IM now then by mouth at home will update asap if not improving  The following medications were removed from the medication list:    Augmentin  875-125 Mg Tabs (Amoxicillin-pot clavulanate) .Marland Kitchen... Take one by mouth two times a day Her updated medication list for this problem includes:    Cipro 500 Mg Tabs (Ciprofloxacin hcl) .Marland Kitchen... 1 by mouth two times a day for 7 days  Orders: T-Culture, Urine ([id number]) Promethazine up to 50mg  (J2550) Admin of Therapeutic Inj  intramuscular or subcutaneous (55732) Prescription Created Electronically [id number]) UA Dipstick W/ Micro (manual) (27062) Urine Pregnancy Test  (37628)  Complete Medication List: 1)  Elmiron 100 Mg Caps (Pentosan polysulfate sodium) ....  Take 2 by mouth at bedtime 2)  Hydroxyzine Hcl 25 Mg Tabs (Hydroxyzine hcl) .... Take one by mouth daily 3)  Seasonique 0.15-0.03 &0.01 Mg Tabs (Levonorgest-eth estrad 91-day) .... As directed 4)  Fruity Chewables Multivitamin Chew (Pediatric multiple vit-c-fa) .... Otc as directed. 5)  Cipro 500 Mg Tabs (Ciprofloxacin hcl) .Marland Kitchen.. 1 by mouth two times a day for 7 days 6)  Promethazine Hcl 25 Mg Tabs (Promethazine hcl) .Marland Kitchen.. 1 by mouth up to three times a day as needed nausea  Patient Instructions: 1)  shot of phenergan today for nausea  2)  continue drinking lots of water 3)  call or seek care is symptoms don't improve in 2-3 days or if you develop back pain, nausea, or vomiting  4)  I will update you when urine culture returns  5)  take the cipro as directed 6)  phenergan orally as needed nausea  Prescriptions: PROMETHAZINE HCL 25 MG TABS (PROMETHAZINE HCL) 1 by mouth up to three times a day as needed nausea  #15 x 0   Entered and Authorized by:   Judith Part MD   Signed by:   Judith Part MD on 09/23/2010   Method used:   Electronically to        [facility]* (retail)       [address]       [place], [place]  [address]       Ph: [phone]       Fax: [phone]   RxID:   [id number] CIPRO 500 MG TABS (CIPROFLOXACIN HCL) 1 by mouth two times a day for 7 days  #14 x 0   Entered and Authorized by:   Judith Part MD   Signed by:   Judith Part MD on 09/23/2010   Method used:   Electronically to        [facility]* (retail)       [address]       [place], [place]  [address]       Ph: [phone]       Fax: [phone]   RxID:   [id number]    Medication Administration  Injection # 1:    Medication: Promethazine up to 50mg     Diagnosis: UTI (ICD-599.0)    Route: IM    Site: LUOQ gluteus    Exp Date: 03/24/2011    Lot #: [id number]    Mfr: [facility]    Comments: Pt received Promethazine 50mg   IM.    Patient tolerated injection without complications    Given by: Lewanda Rife LPN (September 23, 2010 4:36 PM)  Orders Added: 1)  T-Culture, Urine [[id number]] 2)  Promethazine up to 50mg  [J2550] 3)  Admin of Therapeutic Inj  intramuscular or subcutaneous [96372] 4)  Prescription Created Electronically [G8553] 5)  Est. Patient Level III [09983] 6)  UA Dipstick  W/ Micro (manual) [81000] 7)  Urine Pregnancy Test  [81025]    Current Allergies (reviewed today): ! PAXIL CR (PAROXETINE HCL) ! ZYRTEC ALLERGY ! * TUSSINEX  Laboratory Results   Urine Tests  Date/Time Received: September 23, 2010 3:52 PM  Date/Time Reported: September 23, 2010 3:52 PM   Routine Urinalysis   Color: yellow Appearance: Cloudy Glucose: negative   (Normal Range: Negative) Bilirubin: negative   (Normal Range: Negative) Ketone: negative   (Normal Range: Negative) Spec. Gravity: 1.020   (Normal Range: 1.003-1.035) Blood: trace-intact   (Normal Range: Negative) pH: 6.0   (Normal Range: 5.0-8.0) Protein: trace   (Normal Range: Negative) Urobilinogen: 0.2   (Normal Range: 0-1) Nitrite: negative   (Normal Range: Negative) Leukocyte Esterace: trace   (Normal Range: Negative)  Urine Microscopic WBC/HPF: 4-8 RBC/HPF: 3-5 Bacteria/HPF: mild Mucous/HPF: few  Epithelial/HPF: 1-3 Crystals/HPF:  few Casts/LPF: 0 Yeast/HPF: 0 Other: 0    Urine HCG: negative     Laboratory Results   Urine Tests    Routine Urinalysis   Color: yellow Appearance: Cloudy Glucose: negative   (Normal Range: Negative) Bilirubin: negative   (Normal Range: Negative) Ketone: negative   (Normal Range: Negative) Spec. Gravity: 1.020   (Normal Range: 1.003-1.035) Blood: trace-intact   (Normal Range: Negative) pH: 6.0   (Normal Range: 5.0-8.0) Protein: trace   (Normal Range: Negative) Urobilinogen: 0.2   (Normal Range: 0-1) Nitrite: negative   (Normal Range: Negative) Leukocyte Esterace: trace   (Normal Range:  Negative)  Urine Microscopic WBC/hpf: 4-8 RBC/hpf: 3-5 Bacteria: mild Mucous: few  Epithelial: 1-3 Crystals/LPF:  few Casts/LPF: 0 Yeast/HPF: 0 Other: 0    Urine HCG: negative

## 2010-12-23 NOTE — Assessment & Plan Note (Signed)
Summary: SUN POISON/DLO   Vital Signs:  Patient profile:   25 year old female Weight:      157.50 pounds Temp:     97.5 degrees F oral Pulse rate:   108 / minute Pulse rhythm:   regular BP sitting:   130 / 90  (left arm) Cuff size:   regular  Vitals Entered By: Sydell Axon LPN (March 17, 2010 12:03 PM) CC: Sun poisoning, was at the beach yesterday, skin red and painful   History of Present Illness: Pt here for sunburn. Went to [employer] yesterday, was in the sun for two hours. Had 30 sunscreen and felt the reaction on the way home. Had miserable night with burning and discomfort. She has had melanoma on her right foot in the past. She does not see Derm regularly. Otherwise doing well with no other problems.  Needs note for work.   Problems Prior to Update: 1)  Sunburn, Second Degree  (ICD-692.76) 2)  Uti  (ICD-599.0) 3)  Pelvic Pain  (ICD-789.09) 4)  Abdominal Pain  (ICD-789.00) 5)  Vaginal Discharge  (ICD-623.5) 6)  Bacterial Vaginitis  (ICD-616.10) 7)  Sexually Transmitted Disease, Exposure To  (ICD-V01.6) 8)  Examination, Medical Nec, Admn Purposes  (ICD-V70.3) 9)  Herpes Zoster  (ICD-053.9) 10)  Urinary Tract Infection, Hx of  (ICD-V13.00) 11)  Depression  (ICD-311) 12)  Skin Cancer, Hx of  (ICD-V10.83) 13)  Allergic Rhinitis  (ICD-477.9)  Medications Prior to Update: 1)  Elmiron 100 Mg Caps (Pentosan Polysulfate Sodium) .... Take 2 By Mouth At Bedtime 2)  Hydroxyzine Hcl 25 Mg Tabs (Hydroxyzine Hcl) .... Take One By Mouth Daily 3)  Seasonique 0.15-0.03 &0.01 Mg Tabs (Levonorgest-Eth Estrad 91-Day) .... As Directed 4)  Ultram 50 Mg Tabs (Tramadol Hcl) .Marland Kitchen.. 1 By Mouth Up To Three Times A Day As Needed Pain 5)  Cipro 250 Mg Tabs (Ciprofloxacin Hcl) .Marland Kitchen.. 1 By Mouth Two Times A Day For 5 Days 6)  Metrogel-Vaginal 0.75 % Gel (Metronidazole) .Marland Kitchen.. 1 Applicator Each Bedtime For 7 Days Intravaginally  Allergies: 1)  ! Paxil Cr (Paroxetine Hcl) 2)  ! Zyrtec Allergy 3)  ! *  Tussinex  Physical Exam  General:  Well-developed,well-nourished,in no acute distress; alert,appropriate and cooperative throughout examination, in discomfort from whole body sunburn. Head:  normocephalic, atraumatic, and no abnormalities observed.   Eyes:  Conjunctiva clear bilaterally.  Skin:  Red nonblistered skin globally. Skin tight and slightly swollen appearinf on extremities.   Impression & Recommendations:  Problem # 1:  SUNBURN, SECOND DEGREE (ICD-692.76) Assessment New  Recurrent. Dexamethasone 10mg   IM here, now. Note for work. Then see instructions. Start seeing Derm regularly for melanoma risk followup.  Orders: Dexamethasone Sodium Phosphate 1mg  (J1100) Admin of Therapeutic Inj  intramuscular or subcutaneous ([address])  Discussed avoidance of triggers and symptomatic treatment.   Complete Medication List: 1)  Elmiron 100 Mg Caps (Pentosan polysulfate sodium) .... Take 2 by mouth at bedtime 2)  Hydroxyzine Hcl 25 Mg Tabs (Hydroxyzine hcl) .... Take one by mouth daily 3)  Seasonique 0.15-0.03 &0.01 Mg Tabs (Levonorgest-eth estrad 91-day) .... As directed 4)  Ultram 50 Mg Tabs (Tramadol hcl) .Marland Kitchen.. 1 by mouth up to three times a day as needed pain 5)  Silvadene 1 % Crea (Silver sulfadiazine) .... Apply to areas of burning three times a day as needed  Patient Instructions: 1)  Start Silvadene. 2)  Take Claritin 10 mg daily and Benadryl to augment when sleeping. 3)  Take IBP 600mg   four times a day.  Prescriptions: SILVADENE 1 % CREA (SILVER SULFADIAZINE) apply to areas of burning three times a day as needed  #1 tub x 3   Entered and Authorized by:   Shaune Leeks MD   Signed by:   Shaune Leeks MD on 03/17/2010   Method used:   Electronically to        [facility]* (retail)       [address]       [place], [place]  [address]       Ph: [phone]       Fax: [phone]   RxID:   [id number]   Current Allergies  (reviewed today): ! PAXIL CR (PAROXETINE HCL) ! ZYRTEC ALLERGY ! * TUSSINEX   Medication Administration  Injection # 1:    Medication: Dexamethasone Sodium Phosphate 1mg     Diagnosis: SUNBURN, SECOND DEGREE (ICD-692.76)    Route: IM    Site: LUOQ gluteus    Exp Date: 11/23/2010    Lot #: [id number]    Mfr: [facility]    Comments: 10mg  given as instructed    Patient tolerated injection without complications    Given by: Sydell Axon LPN (March 17, 2010 12:43 PM)  Orders Added: 1)  Est. Patient Level III [40102] 2)  Dexamethasone Sodium Phosphate 1mg  [J1100] 3)  Admin of Therapeutic Inj  intramuscular or subcutaneous [72536]

## 2010-12-23 NOTE — Letter (Signed)
Summary: Out of Work  [facility] at [facility]  [address] [place], [place] [address]   Phone: [phone]  Fax: [phone]    September 23, 2010   Employee:  JASMINEMARIE SHERRARD    To Whom It May Concern:   For Medical reasons, please excuse the above named employee from work for the following dates:  Start:   09/23/2010  End:   can return 09/25/2010 if she is feeling better   If you need additional information, please feel free to contact our office.         Sincerely,    Judith Part MD

## 2010-12-23 NOTE — Letter (Signed)
Summary: Out of Work  [facility] at [facility]  [address] [place], [place] [address]   Phone: [phone]  Fax: [phone]    March 19, 2010   Employee:  PRITI CONSOLI    To Whom It May Concern:   For Medical reasons, please excuse the above named employee from work for the following dates:  Start:   03/19/2010  End:   03/21/2010  If you need additional information, please feel free to contact our office.         Sincerely,    Shaune Leeks MD

## 2010-12-23 NOTE — Assessment & Plan Note (Signed)
Summary: ?UTI/CLE   Vital Signs:  Patient profile:   25 year old female Height:      68 inches Weight:      162.25 pounds BMI:     24.76 Temp:     97.9 degrees F oral Pulse rate:   84 / minute Pulse rhythm:   regular BP sitting:   134 / 78  (left arm) Cuff size:   regular  Vitals Entered By: Lewanda Rife LPN (January 24, 453 8:44 AM)  History of Present Illness: is having lower back pain and pelvic pain  ? bladder   her gyn is off this week  also sees urologist for frequent utis  no abx presently   no dysuria  no blood in urine  a little nausea - no vomiting  no fever   no exp or worries about stds   is having very thin discharge  no itch or burn no odor to it  is prone to bact vaginitis   Allergies: 1)  ! Paxil Cr (Paroxetine Hcl) 2)  ! Zyrtec Allergy 3)  ! 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 Eyes:  Denies blurring and discharge. ENT:  Denies sore throat. CV:  Denies chest pain or discomfort, palpitations, and shortness of breath with exertion. Resp:  Denies shortness of breath. GI:  Complains of abdominal pain, loss of appetite, and nausea; denies bloody stools and change in bowel habits. GU:  Complains of discharge and dysuria; denies genital  sores and hematuria. Derm:  Denies itching, lesion(s), poor wound healing, and rash. Neuro:  Denies headaches. Heme:  Denies abnormal bruising and bleeding.  Physical Exam  General:  Well-developed,well-nourished,in no acute distress; alert,appropriate and cooperative throughout examination Head:  normocephalic, atraumatic, and no abnormalities observed.   Mouth:  pharynx pink and moist.   Neck:  No deformities, masses, or tenderness noted. Lungs:  Normal respiratory effort, chest expands symmetrically. Lungs are clear to auscultation, no crackles or wheezes. Heart:  Normal rate and regular rhythm. S1 and S2 normal without gallop, murmur, click, rub or other extra sounds. Abdomen:  suprapubic tendenress without rebound or gaurding  Genitalia:  mod white d/c without odor  no lesions  some cmt   Msk:  LS is mildly tender/also musculature no CVA tenderness  Extremities:  No clubbing, cyanosis, edema, or deformity noted with normal full range of motion of all joints.   Skin:  Intact without suspicious lesions or rashes Cervical Nodes:  No lymphadenopathy noted Psych:  nl affect is fatigued and generally uncomfortable    Impression & Recommendations:  Problem # 1:  PELVIC  PAIN (ICD-789.09) Assessment New with some bacturia also clue cells and bact on wet prep  gon/chl cx pend  ucx pend  will cover for both uti and vaginitis and update  ultram for pain with  caution  Her updated medication list for this problem includes:    Ultram 50 Mg Tabs (Tramadol hcl) .Marland Kitchen... 1 by mouth up to three times a day as needed pain  Orders: T-Chlamydia Probe, genital [phone]) T-GC Probe, genital [phone]) T-Culture, Urine ([id number]) Specimen Handling (75643) UA Dipstick W/ Micro (manual) (81000) Wet Prep (32951OA)  Problem # 2:  BACTERIAL VAGINITIS (ICD-616.10) Assessment: Deteriorated  will tx with metrogel vaginal pt currently not sexually acitve update if not imp  The  following medications were removed from the medication list:    Amoxicillin 500 Mg Caps (Amoxicillin) .Marland Kitchen... 1 by mouth two times a day Her updated medication list for this problem includes:    Cipro 250 Mg Tabs (Ciprofloxacin hcl) .Marland Kitchen... 1 by mouth two times a day for 5 days    Metrogel-vaginal 0.75 % Gel (Metronidazole) .Marland Kitchen... 1 applicator each bedtime for 7 days intravaginally  Orders: UA Dipstick W/ Micro (manual) (41660) Wet Prep (63016WF)  Problem # 3:  UTI (ICD-599.0) Assessment: New  with bacturia on ua  pend cx cover with cipro 5 days pt advised to update me if symptoms worsen or do not improve  The following medications were removed from the medication list:    Amoxicillin 500 Mg Caps (Amoxicillin) .Marland Kitchen... 1 by mouth two times a day Her updated medication list for this problem includes:    Cipro 250 Mg Tabs (Ciprofloxacin hcl) .Marland Kitchen... 1 by mouth two times a day for 5 days  Orders: UA Dipstick W/ Micro (manual) (09323) Wet Prep (55732KG)  Complete Medication List: 1)  Elmiron 100 Mg Caps (Pentosan polysulfate sodium) .... Take 2 by mouth at bedtime 2)  Hydroxyzine Hcl 25 Mg Tabs (Hydroxyzine hcl) .... Take one by mouth daily 3)  Seasonique 0.15-0.03 &0.01 Mg Tabs (Levonorgest-eth estrad 91-day) .... As directed 4)  Ultram 50 Mg Tabs (Tramadol hcl) .Marland Kitchen.. 1 by mouth up to three times a day as needed pain 5)  Cipro 250 Mg Tabs (Ciprofloxacin hcl) .Marland Kitchen.. 1 by mouth two times a day for 5 days 6)  Metrogel-vaginal 0.75 % Gel (Metronidazole) .Marland Kitchen.. 1 applicator each bedtime for 7 days intravaginally  Patient Instructions: 1)  continue drinking lots of water 2)  call or seek care is symptoms don't improve in 2-3 days or if you develop back pain, nausea, or vomiting 3)  take the cipro and metrogel as directed  4)  use ultram for pain with caution - as it can sedate  5)  pend std test and urine culture will update you 6)  update me if symptoms worsen Prescriptions: METROGEL-VAGINAL 0.75  % GEL (METRONIDAZOLE) 1 applicator each bedtime for 7 days intravaginally  #7 days x 0   Entered and Authorized by:   Judith Part MD   Signed by:   Judith Part MD on 01/24/2010   Method used:   Print then Give to Patient   RxID:   [id number] CIPRO 250 MG TABS (CIPROFLOXACIN HCL) 1 by mouth two times a day for 5 days  #10 x 0   Entered and Authorized by:   Judith Part MD   Signed by:   Judith Part MD on 01/24/2010   Method used:   Print then Give to Patient   RxID:   [id number] ULTRAM 50 MG TABS (TRAMADOL HCL) 1 by mouth up to three times a day as needed pain  #20 x 0   Entered and Authorized by:   Judith Part MD  Signed by:   Judith Part MD on 01/24/2010   Method used:   Print then Give to Patient   RxID:   [id number]   Current Allergies (reviewed today): ! PAXIL CR (PAROXETINE HCL) ! ZYRTEC ALLERGY ! * TUSSINEX  Laboratory Results   Urine Tests  Date/Time Received: January 24, 2010 8:45 AM  Date/Time Reported: January 24, 2010 8:45 AM   Routine Urinalysis   Color: straw Appearance: Cloudy Glucose: negative   (Normal Range: Negative) Bilirubin: negative   (Normal Range: Negative) Ketone: negative   (Normal Range: Negative) Spec. Gravity: 1.015   (Normal Range: 1.003-1.035) Blood: trace-intact   (Normal Range: Negative) pH: 6.0   (Normal Range: 5.0-8.0) Protein: trace   (Normal Range: Negative) Urobilinogen: 0.2   (Normal Range: 0-1) Nitrite: negative   (Normal Range: Negative) Leukocyte Esterace: trace   (Normal Range: Negative)  Urine Microscopic WBC/HPF: 3-4 RBC/HPF: 0 Bacteria/HPF: mod Mucous/HPF: few Epithelial/HPF: 1-2 Crystals/HPF: few Casts/LPF: 0 Yeast/HPF: 0 Other: 0    Urine HCG: negative Comments: some clue cells noted in urine    [employer] Source: vaginal  WBC/hpf: 10-20 Bacteria/hpf: 2+  Rods Clue cells/hpf: moderate  Negative whiff Yeast/hpf: none Wet [place] KOH: Negative Trichomonas/hpf:  none   Laboratory Results   Urine Tests    Routine Urinalysis   Color: straw Appearance: Cloudy Glucose: negative   (Normal Range: Negative) Bilirubin: negative   (Normal Range: Negative) Ketone: negative   (Normal Range: Negative) Spec. Gravity: 1.015   (Normal Range: 1.003-1.035) Blood: trace-intact   (Normal Range: Negative) pH: 6.0   (Normal Range: 5.0-8.0) Protein: trace   (Normal Range: Negative) Urobilinogen: 0.2   (Normal Range: 0-1) Nitrite: negative   (Normal Range: Negative) Leukocyte Esterace: trace   (Normal Range: Negative)  Urine Microscopic WBC/hpf: 3-4 RBC/hpf: 0 Bacteria: mod Mucous: few Epithelial: 1-2 Crystals/LPF: few Casts/LPF: 0 Yeast/HPF: 0 Other: 0    Urine HCG: negative Comments: some clue cells noted in urine    Wet Mount/KOH Source: vaginal  WBC/hpf 10-20 Bacteria/hpf 2+  Rods Clue cells/hpf moderate  Negative whiff Yeast/hpf none KOH Negative Trichomonas/hpf none

## 2010-12-23 NOTE — Miscellaneous (Signed)
Summary: Controlled Substances Contract  Controlled Substances Contract   Imported By: Lester Beaconsfield 06/05/2010 11:58:10  _____________________________________________________________________  External Attachment:    Type:   Image     Comment:   External Document

## 2010-12-23 NOTE — Letter (Signed)
Summary: Out of Work  [facility]  [address] [place], [place] [address]   Phone: [phone]  Fax: [phone]    March 17, 2010   Employee:  IDONIA ZOLLINGER    To Whom It May Concern:   For Medical reasons, please excuse the above named employee from work for the following dates:  Start:   03/17/2010  End:   03/19/2010  If you need additional information, please feel free to contact our office.         Sincerely,    Shaune Leeks MD

## 2010-12-23 NOTE — Letter (Signed)
Summary: Out of Work  [facility] at [facility]  [address] [place], [place] [address]   Phone: [phone]  Fax: [phone]    June 25, 2010   Employee:  KEIRSTAN IANNELLO    To Whom It May Concern:   For Medical reasons, please excuse the above named employee from work for the following dates:  Start:  June 25, 2010 11:51 AM   End:  June 26, 2010 11:51 AM    If you need additional information, please feel free to contact our office.         Sincerely,    Kerby Nora MD

## 2010-12-23 NOTE — Letter (Signed)
Summary: Out of Work  [facility] at [facility]  [address] [place], [place] [address]   Phone: [phone]  Fax: [phone]    December 10, 2009   Employee:  LATICA HOHMANN    To Whom It May Concern:   For Medical reasons, please excuse the above named employee from work for the following dates:  Start:   12/10/2009 for flu like illness   End:   can return 12/13/2009 if she is feeling better   If you need additional information, please feel free to contact our office.         Sincerely,    Judith Part MD

## 2010-12-23 NOTE — Assessment & Plan Note (Signed)
Summary: ?SINUS INFECTION,NAUSEA/CLE   Vital Signs:  Patient profile:   25 year old female Height:      68 inches Weight:      155.75 pounds BMI:     23.77 Temp:     97.7 degrees F oral Pulse rate:   96 / minute Pulse rhythm:   regular Resp:     24 per minute BP sitting:   100 / 68  (left arm) Cuff size:   regular  Vitals Entered By: Lewanda Rife LPN (July 22, 2010 8:44 AM) CC: sinus infection with drainage at back of throat, head congested and productive cough with yellow mucus.   History of Present Illness: started getting sick fri  sneeze and runny nose and then severe congestion and then nausea  achey now - fever on and off / occ chills  drainge in back of throat  bad st yesterday- better now  facial pressure   no vomiting no chance pregnant   cough -- little and non prod   this feels much worse than a regular cold  is taking tylenol and alka selzer cold   Allergies: 1)  ! Paxil Cr (Paroxetine Hcl) 2)  ! Zyrtec Allergy 3)  ! 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 Eyes:  Denies discharge and eye irritation. ENT:  Complains of earache, nasal congestion, postnasal drainage, sinus pressure, and sore throat. CV:  Denies chest pain or discomfort and  palpitations. Resp:  Complains of cough; denies pleuritic, shortness of breath, sputum productive, and wheezing. GI:  Complains of nausea; denies diarrhea and vomiting. Derm:  Denies itching and rash.  Physical Exam  General:  Well-developed,well-nourished,in no acute distress; alert,appropriate and cooperative throughout examination Head:  tender frontal and maxillary sinuses  normocephalic, atraumatic, and no abnormalities observed.   Eyes:  vision grossly intact, pupils equal, pupils round, and pupils reactive to light.  very slt conj injection without d/c Ears:  R ear normal and L ear normal.   Nose:  nares are injected and congested bilaterally  Mouth:  post erythema and clear post nasal drip Neck:  No deformities, masses, or tenderness noted. Lungs:  Normal respiratory effort, chest expands symmetrically. Lungs are clear to auscultation, no crackles or wheezes. Heart:  Normal rate and regular rhythm. S1 and S2 normal without gallop, murmur, click, rub or other extra sounds. 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 1 by mouth two times a day for 10 days  Orders: Prescription Created Electronically [phone])  Complete Medication List: 1)  Elmiron 100 Mg Caps (Pentosan polysulfate sodium) .... Take 2 by mouth at bedtime 2)  Hydroxyzine Hcl 25 Mg Tabs (Hydroxyzine hcl) .... Take one by mouth daily 3)  Seasonique 0.15-0.03 &0.01 Mg Tabs (Levonorgest-eth estrad 91-day) .... As directed 4)  Augmentin  875-125 Mg Tabs (Amoxicillin-pot clavulanate) .Marland Kitchen.. 1 by mouth two times a day for 10 days  Patient Instructions: 1)  you can try mucinex over the counter twice daily as directed and nasal saline spray for congestion 2)  tylenol over the counter as directed may help with aches, headache and fever 3)  call if symptoms worsen or if not improved in 4-5 days  4)  take the augmentin for sinus infection as directed  Prescriptions: AUGMENTIN 875-125 MG TABS (AMOXICILLIN-POT CLAVULANATE) 1 by mouth two times a day for 10 days  #20 x 0   Entered and Authorized by:   Judith Part MD   Signed by:   Judith Part MD on 07/22/2010   Method used:   Electronically to        [facility]* (retail)       [address]       [place], [place]  [address]       Ph: [phone]       Fax: [phone]   RxID:   [id number]   Current Allergies (reviewed today): ! PAXIL CR (PAROXETINE HCL) ! ZYRTEC ALLERGY ! * TUSSINEX

## 2010-12-23 NOTE — Progress Notes (Signed)
Summary: needs another work note  Phone Note Call from Patient Call back at [employer] [phone]   Caller: Patient Call For: Dr. Hetty Ely Summary of Call: Patient is requesting another work note to extend her time away. She was to return today and she told her boss that she could not be in the sun and they told her there was no need to come in, but she will need a work not to excuse her. Please call patient when ready.  Initial call taken by: Melody Comas,  March 19, 2010 9:06 AM  Follow-up for Phone Call        Patient notified that note is ready to be picked up.  Follow-up by: Melody Comas,  March 19, 2010 10:19 AM

## 2010-12-23 NOTE — Consult Note (Signed)
Summary: [facility]  [facility]   Imported By: Lanelle Bal 04/30/2010 08:53:39  _____________________________________________________________________  External Attachment:    Type:   Image     Comment:   External Document

## 2010-12-25 NOTE — Letter (Signed)
Summary: Out of Work  [facility] at [facility]  [address] [place], [place] [address]   Phone: [phone]  Fax: [phone]    November 20, 2010   Employee:  BRENN GATTON    To Whom It May Concern:   For Medical reasons, please excuse the above named employee from work for the following dates:  Start:   11/20/2010  End:   11/24/2010 if she is feeling better   If you need additional information, please feel free to contact our office.         Sincerely,    Judith Part MD

## 2010-12-25 NOTE — Assessment & Plan Note (Signed)
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 Paxil Cr (Paroxetine Hcl) 2)  ! Zyrtec Allergy 3)  ! 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 Eyes:  Denies blurring and eye irritation. CV:  Denies chest pain or discomfort and palpitations. Resp:  Complains of cough and sputum productive; denies pleuritic, shortness of breath, and wheezing. GI:  Denies abdominal pain, bloody stools, and change in bowel habits. MS:  Complains of muscle aches. Derm:  Denies rash.  Physical Exam  General:  fatigued appearing  Head:  normocephalic, atraumatic, and no abnormalities  observed.  marked bilateral frontal sinus tenderness  Eyes:  vision grossly intact, pupils equal, pupils round, pupils reactive to light, and no injection.   Ears:  R ear normal and L ear normal.   Nose:  nares are injected and congested bilaterally  Mouth:  pharynx pink and moist, no erythema, and no exudates.   Neck:  No deformities, masses, or tenderness noted. Lungs:  CTA with harsh bs at bases - no rales or rhonchi  no wheeze Heart:  Normal rate and regular rhythm. S1 and S2 normal without gallop, murmur, click, rub or other extra sounds. Skin:  Intact without suspicious lesions or rashes Cervical Nodes:  No lymphadenopathy noted Psych:  nl affect / fatigued   Impression & Recommendations:  Problem # 1:  SINUSITIS, ACUTE (ICD-461.9) Assessment New  with facial pain and fever following 1 wk of uri - also nausea (could be from nsaids on empty stomach)  tx with augmentin  recommend sympt care- see pt instructions   pt advised to update me if symptoms worsen or do not improve  also shot of phenergan 50mg  for nausea -- expl this will sedate (mother will drive) no chance of pregnancy  also disc dec in efficacy of OC on augmentin- will use back up protection  The following medications were removed from the medication list:    Cipro 500 Mg Tabs (Ciprofloxacin hcl) .Marland Kitchen... 1 by mouth two times a day for 7 days Her updated medication list for this  problem includes:    Augmentin 875-125 Mg Tabs (Amoxicillin-pot clavulanate) .Marland Kitchen... 1 by mouth two times a day for sinus infection for 10 days  Orders: Promethazine up to 50mg  (J2550) Admin of Therapeutic Inj  intramuscular or subcutaneous ([address])  Complete Medication List: 1)  Elmiron 100 Mg Caps (Pentosan polysulfate sodium) .... Take 2 by mouth at bedtime 2)  Hydroxyzine Hcl 25 Mg Tabs (Hydroxyzine hcl) .... Take one by mouth daily 3)  Seasonique 0.15-0.03 &0.01 Mg Tabs (Levonorgest-eth estrad 91-day) .... As directed 4)  Augmentin 875-125  Mg Tabs (Amoxicillin-pot clavulanate) .Marland Kitchen.. 1 by mouth two times a day for sinus infection for 10 days  Patient Instructions: 1)  you can try mucinex over the counter twice daily as directed and nasal saline spray for congestion 2)  tylenol over the counter as directed may help with aches, headache and fever 3)  stop the aleve or any other anti inflammatories - they irritate stomach and can cause pain or nausea  4)  call if symptoms worsen or if not improved in 4-5 days 5)  take the augmentin as directed for sinus infection  6)  shot of phenergan today for nausea  7)    Prescriptions: AUGMENTIN 875-125 MG TABS (AMOXICILLIN-POT CLAVULANATE) 1 by mouth two times a day for sinus infection for 10 days  #20 x 0   Entered and Authorized by:   Judith Part MD   Signed by:   Judith Part MD on 11/20/2010   Method used:   Print then Give to Patient   RxID:   [id number]    Medication Administration  Injection # 1:    Medication: Promethazine up to 50mg     Diagnosis: SINUSITIS, ACUTE (ICD-461.9)    Route: IM    Site: RUOQ gluteus    Exp Date: 03/24/2011    Lot #: [id number]    Mfr: [email or web address]    Comments: pt got 50mg /ml     Patient tolerated injection without complications    Given by: Mervin Hack CMA Duncan Dull) (November 20, 2010 11:51 AM)  Orders Added: 1)  Promethazine up to 50mg  [J2550] 2)  Admin of Therapeutic Inj  intramuscular or subcutaneous [96372] 3)  Est. Patient Level III [08657]    Current Allergies (reviewed today): ! PAXIL CR (PAROXETINE HCL) ! ZYRTEC ALLERGY ! * TUSSINEX   Medication Administration  Injection # 1:    Medication: Promethazine up to 50mg     Diagnosis: SINUSITIS, ACUTE (ICD-461.9)    Route: IM    Site: RUOQ gluteus    Exp Date: 03/24/2011    Lot #: [id number]    Mfr: [email or web address]    Comments: pt got 50mg /ml     Patient tolerated injection without complications    Given by: Mervin Hack CMA Duncan Dull) (November 20, 2010 11:51 AM)  Orders  Added: 1)  Promethazine up to 50mg  [J2550] 2)  Admin of Therapeutic Inj  intramuscular or subcutaneous [96372] 3)  Est. Patient Level III [95284]

## 2011-01-08 NOTE — Letter (Signed)
Summary: [facility] Visit  [facility] Visit   Imported By: Kassie Mends 12/30/2010 10:08:26  _____________________________________________________________________  External Attachment:    Type:   Image     Comment:   External Document

## 2011-01-20 ENCOUNTER — Encounter: Payer: Self-pay | Admitting: Family Medicine

## 2011-01-20 ENCOUNTER — Ambulatory Visit (INDEPENDENT_AMBULATORY_CARE_PROVIDER_SITE_OTHER): Payer: BC Managed Care – PPO | Admitting: Family Medicine

## 2011-01-20 DIAGNOSIS — N390 Urinary tract infection, site not specified: Secondary | ICD-10-CM

## 2011-01-20 LAB — CONVERTED CEMR LAB
Ketones, urine, test strip: NEGATIVE
Nitrite: NEGATIVE

## 2011-01-21 ENCOUNTER — Encounter: Payer: Self-pay | Admitting: Family Medicine

## 2011-01-22 ENCOUNTER — Telehealth: Payer: Self-pay | Admitting: Family Medicine

## 2011-01-26 ENCOUNTER — Encounter: Payer: Self-pay | Admitting: Family Medicine

## 2011-01-27 ENCOUNTER — Telehealth: Payer: Self-pay | Admitting: Family Medicine

## 2011-01-29 NOTE — Assessment & Plan Note (Signed)
Summary: ?UTI/CLE   BCBS   Vital Signs:  Patient profile:   25 year old female Height:      68 inches Weight:      160.50 pounds BMI:     24.49 Temp:     98.3 degrees F oral Pulse rate:   88 / minute Pulse rhythm:   regular BP sitting:   118 / 70  (left arm) Cuff size:   regular  Vitals Entered By: Lewanda Rife LPN (January 20, 2011 3:56 PM) CC: ?UTI, low abdominal pain, frequency and burn and pain when urinates, Pain level now is 7.   History of Present Illness: has hx of IC  started symptoms mildly over the weekend  now much worse with nausea  frequency and burning and low abd soreness  no blood in urine  low back pain- ? from this or now -- gets that from picking up kids  felt feverish this am   wants a shot for nausea     Allergies: 1)  ! Paxil Cr (Paroxetine Hcl) 2)  ! Zyrtec Allergy 3)  ! * Tussinex  Past History:  Past Surgical History: Last updated: 04/25/2009 tonsillectomy abn paps, cryosx sx for melanoma R leg PID mult hosp admissions missed ab laparoscopy pelvic 12/06 CT sinuses neg fx R foot--04/13/2009  Family History: Last updated: 03/22/2007 MGM breast ca  Social History: Last updated: 05/03/2008 Former Smoker single works and going to school  plans to graduate dec 09 works at a day care   Risk Factors: Smoking Status: quit (03/22/2007)  Past Medical History: Allergic rhinitis Skin cancer, hx of- melanoma Depression hx of kidney reflux as a child hx of PID endometriosis IC frequent utis  hx of shingles   Review of Systems General:  Complains of chills, fatigue, and malaise; denies loss of appetite. Eyes:  Denies blurring. CV:  Denies chest pain or discomfort and palpitations. Resp:  Denies cough and wheezing. GI:  Complains of abdominal pain and nausea; denies bloody stools, change in bowel habits, and vomiting. GU:  Complains of dysuria and urinary frequency; denies discharge. Derm:  Denies itching, lesion(s), poor  wound healing, and rash. Neuro:  Denies numbness and tingling.  Physical Exam  General:  fatigued but well appearing  Head:  normocephalic, atraumatic, and no abnormalities observed.   Eyes:  vision grossly intact, pupils equal, pupils round, and pupils reactive to light.   Mouth:  MMM Neck:  No deformities, masses, or tenderness noted. Lungs:  CTA with harsh bs at bases - no rales or rhonchi  no wheeze Heart:  Normal rate and regular rhythm. S1 and S2 normal without gallop, murmur, click, rub or other extra sounds. Abdomen:  suprapubic tenderness without rebound or gaurding  Msk:  very mild cva tenderness bilat  Extremities:  no edema Skin:  Intact without suspicious lesions or rashes brisk cap refil Cervical Nodes:  No lymphadenopathy noted Inguinal Nodes:  No significant adenopathy Psych:  normal affect, talkative and pleasant    Impression & Recommendations:  Problem # 1:  UTI (ICD-599.0) Assessment New complicated by hx of self catheterization and IC  pos ua  significant nausea and per pt no chance pregnant phenergan IM now  tx with high dose cipro urine cx sent  pt advised to update me if symptoms worsen or do not improve - esp if fever or inc back pain The following medications were removed from the medication list:    Augmentin 875-125 Mg Tabs (Amoxicillin-pot clavulanate) .Marland KitchenMarland KitchenMarland KitchenMarland Kitchen 1  by mouth two times a day for sinus infection for 10 days Her updated medication list for this problem includes:    Cipro 500 Mg Tabs (Ciprofloxacin hcl) .Marland Kitchen... 1 by mouth two times a day for 7 days  Orders: UA Dipstick W/ Micro (manual) (40981) T-Culture, Urine ([id number]) Specimen Handling (99000) Promethazine up to 50mg  (J2550) Admin of Therapeutic Inj  intramuscular or subcutaneous (21308) Prescription Created Electronically [id number])  Complete Medication List: 1)  Elmiron 100 Mg Caps (Pentosan polysulfate sodium) .... Take 2 by mouth at bedtime 2)  Hydroxyzine Hcl 25 Mg Tabs  (Hydroxyzine hcl) .... Take one by mouth daily 3)  Seasonique 0.15-0.03 &0.01 Mg Tabs (Levonorgest-eth estrad 91-day) .... As directed 4)  Cipro 500 Mg Tabs (Ciprofloxacin hcl) .Marland Kitchen.. 1 by mouth two times a day for 7 days 5)  Promethazine Hcl 25 Mg Tabs (Promethazine hcl) .Marland Kitchen.. 1 by mouth up to three times a day as needed nausea  watch out for sedation  Patient Instructions: 1)  continue drinking lots of water 2)  call or seek care is symptoms don't improve in 2-3 days or if you develop worse back pain or vomiting or fever  3)  take the cipro as directed  4)  shot today for nausea 5)  phenergan px for nausea -- as needed - will sedate- be careful  6)  I will update you when culture returns  Prescriptions: PROMETHAZINE HCL 25 MG TABS (PROMETHAZINE HCL) 1 by mouth up to three times a day as needed nausea  watch out for sedation  #20 x 0   Entered and Authorized by:   Judith Part MD   Signed by:   Judith Part MD on 01/20/2011   Method used:   Electronically to        [facility] [address] (retail)       [address] [place], [place]  [address]       Ph: [phone]       Fax: [phone]   RxID:   [id number] CIPRO 500 MG TABS (CIPROFLOXACIN HCL) 1 by mouth two times a day for 7 days  #14 x 0   Entered and Authorized by:   Judith Part MD   Signed by:   Judith Part MD on 01/20/2011   Method used:   Electronically to        [facility] [address] (retail)       [address] [place], [place]  [address]       Ph: [phone]       Fax: [phone]   RxID:   [id number]    Medication Administration  Injection # 1:    Medication: Promethazine up to 50mg     Diagnosis: UTI (ICD-599.0)    Route: IM    Site: LUOQ gluteus    Exp Date: 03/24/2011    Lot #: [id number]    Mfr: [facility]    Comments: Pt received Promethazine 50mg  IM.    Patient tolerated injection without  complications    Given by: Lewanda Rife LPN (January 20, 2011 5:00 PM)  Orders Added: 1)  UA Dipstick W/ Micro (manual) [81000] 2)  T-Culture, Urine [[id number]] 3)  Specimen Handling [99000] 4)  Promethazine up to 50mg  [J2550] 5)  Admin of Therapeutic Inj  intramuscular or subcutaneous [96372] 6)  Prescription Created Electronically [G8553] 7)  Est. Patient Level III [16109]    Current Allergies (reviewed today): ! PAXIL CR (PAROXETINE HCL) ! ZYRTEC ALLERGY ! * TUSSINEX  Laboratory Results   Urine Tests  Date/Time Received: January 20, 2011 3:57 PM  Date/Time Reported: January 20, 2011 3:57 PM   Routine Urinalysis   Color: yellow Appearance: Cloudy Glucose: negative   (Normal Range: Negative) Bilirubin: negative   (Normal Range: Negative) Ketone: negative   (Normal Range: Negative) Spec. Gravity: 1.015   (Normal Range: 1.003-1.035) Blood: small   (Normal Range: Negative) pH: 6.0   (Normal Range: 5.0-8.0) Protein: trace   (Normal Range: Negative) Urobilinogen: 0.2   (Normal Range: 0-1) Nitrite: negative   (Normal Range: Negative) Leukocyte Esterace: small   (Normal Range: Negative)         Appended Document: Office Visit ([facility])      Allergies: 1)  ! Paxil Cr (Paroxetine Hcl) 2)  ! Zyrtec Allergy 3)  ! * Tussinex     Complete Medication List: 1)  Elmiron 100 Mg Caps (Pentosan polysulfate sodium) .... Take 2 by mouth at bedtime 2)  Hydroxyzine Hcl 25 Mg Tabs (Hydroxyzine hcl) .... Take one by mouth daily 3)  Seasonique 0.15-0.03 &0.01 Mg Tabs (Levonorgest-eth estrad 91-day) .... As directed 4)  Cipro 500 Mg Tabs (Ciprofloxacin hcl) .Marland Kitchen.. 1 by mouth two times a day for 7 days 5)  Promethazine Hcl 25 Mg Tabs (Promethazine hcl) .Marland Kitchen.. 1 by mouth up to three times a day as needed nausea  watch out for sedation  ]  Laboratory Results   Urine Tests    Urine Microscopic WBC/hpf: 5-10 RBC/hpf: many Bacteria: many  Mucous: few Epithelial:  1-3 Crystals/LPF: few Casts/LPF: 0 Yeast/HPF: 0 Other: 0

## 2011-01-29 NOTE — Letter (Signed)
Summary: Out of Work  [facility] at [facility]  [address] [place], [place] [address]   Phone: [phone]  Fax: [phone]    January 20, 2011   Employee:  ARISBEL MAIONE    To Whom It May Concern:   For Medical reasons, please excuse the above named employee from work for the following dates:  Start:   01/20/2011  End:   can return 01/22/2011 if feeling better   If you need additional information, please feel free to contact our office.         Sincerely,    Judith Part MD

## 2011-02-03 NOTE — Progress Notes (Signed)
Summary: Pt still having pain upon urination  Phone Note Call from Patient Call back at [phone]   Caller: Patient Call For: Judith Part MD Summary of Call: Pt's last dose of antibiotic is today, Pt still has frequency and pain when urinates. Pt still has lower abd pain but pain is not as bad as when seen. Today pain level is 4. No back pain and no fever. Pt uses [facility] if med needs to be called in.Pt can be reached at [phone].Lewanda Rife LPN  January 26, 7845 4:15 PM   Follow-up for Phone Call        I want to extend the cipro then re check ua in 1 week please -thanks  px written on EMR for call in  let me know in meantime if symptoms worsen Follow-up by: Judith Part MD,  January 27, 2011 4:57 PM  Additional Follow-up for Phone Call Additional follow up Details #1::        Med sent elecronically to [facility] in [place]. Patient notified as instructed by telephone. Pt said she would call back to schedule appt for u/a.Lewanda Rife LPN  January 27, 9628 5:16 PM     Prescriptions: CIPRO 500 MG TABS (CIPROFLOXACIN HCL) 1 by mouth two times a day for 7 days  #14 x 0   Entered by:   Lewanda Rife LPN   Authorized by:   Judith Part MD   Signed by:   Lewanda Rife LPN on 52/84/1324   Method used:   Electronically to        [facility] [address] (retail)       [address] [place], [place]  [address]       Ph: [phone]       Fax: [phone]   RxID:   [id number] CIPRO 500 MG TABS (CIPROFLOXACIN HCL) 1 by mouth two times a day for 7 days  #14 x 0   Entered and Authorized by:   Judith Part MD   Signed by:   Judith Part MD on 01/27/2011   Method used:   Telephoned to ...       [facility] [address] (retail)       [address] [place], [place]  [address]       Ph: [phone]       Fax: [phone]   RxID:   [id number]

## 2011-02-03 NOTE — Letter (Signed)
Summary: Out of Work  [facility] at [facility]  [address] [place], [place] [address]   Phone: [phone]  Fax: [phone]    January 26, 2011   Employee:  Pamela Calhoun    To Whom It May Concern:   For Medical reasons, please excuse the above named employee from work for the following dates:  Start:   01/22/2011  End:   can return 3/2 if she is feeling better   If you need additional information, please feel free to contact our office.         Sincerely,    Judith Part MD

## 2011-02-03 NOTE — Progress Notes (Signed)
Summary: needs out of work note extended  Phone Note Call from Patient Call back at [employer] [phone]   Caller: Patient Call For: Judith Part MD Summary of Call: Pt was seen for UTI and was given a note for work.  She says you offered her an extended one but she didnt think she would need it, now she does.  She was out of work today, but will go back tomorrow.  She wants to pick up a note on monday for being out today.  Please call when ready. Initial call taken by: Lowella Petties CMA, AAMA,  January 22, 2011 5:46 PM  Follow-up for Phone Call        letter done in IN box Follow-up by: Judith Part MD,  January 26, 2011 8:15 AM  Additional Follow-up for Phone Call Additional follow up Details #1::        Advised pt note is ready for pick up. Additional Follow-up by: Lowella Petties CMA, AAMA,  January 26, 2011 9:39 AM

## 2011-03-02 ENCOUNTER — Encounter: Payer: Self-pay | Admitting: Family Medicine

## 2011-03-03 ENCOUNTER — Encounter: Payer: Self-pay | Admitting: Family Medicine

## 2011-03-03 ENCOUNTER — Encounter: Payer: Self-pay | Admitting: *Deleted

## 2011-03-03 ENCOUNTER — Ambulatory Visit (INDEPENDENT_AMBULATORY_CARE_PROVIDER_SITE_OTHER): Payer: BC Managed Care – PPO | Admitting: Family Medicine

## 2011-03-03 VITALS — BP 120/70 | HR 83 | Temp 97.6°F | Ht 70.0 in | Wt 160.1 lb

## 2011-03-03 DIAGNOSIS — R300 Dysuria: Secondary | ICD-10-CM

## 2011-03-03 DIAGNOSIS — J069 Acute upper respiratory infection, unspecified: Secondary | ICD-10-CM

## 2011-03-03 DIAGNOSIS — N390 Urinary tract infection, site not specified: Secondary | ICD-10-CM

## 2011-03-03 LAB — POCT URINALYSIS DIPSTICK
Bilirubin, UA: NEGATIVE
Glucose, UA: NEGATIVE
Nitrite, UA: NEGATIVE
Spec Grav, UA: 1.03
Urobilinogen, UA: NEGATIVE

## 2011-03-03 MED ORDER — PROMETHAZINE HCL 25 MG/ML IJ SOLN
25.0000 mg | INTRAMUSCULAR | Status: AC
  Administered 2011-03-03: 25 mg via INTRAMUSCULAR

## 2011-03-03 MED ORDER — GUAIFENESIN-CODEINE 100-10 MG/5ML PO SYRP: 5.0000 mL | ORAL_SOLUTION | Freq: Two times a day (BID) | ORAL | Status: DC | PRN

## 2011-03-03 NOTE — Patient Instructions (Signed)
Drink lots of fluids.  Treat sympotmatically with Mucinex, nasal saline irrigation, and Tylenol/Ibuprofen. Also try claritin D or zyrtec D over the counter- two times a day as needed ( have to sign for them at pharmacy). You can use warm compresses.  Cough suppressant at night. Call if not improving as expected in 5-7 days.

## 2011-03-03 NOTE — Progress Notes (Signed)
Addended by: Linde Gillis on: 03/03/2011 08:46 AM   Modules accepted: Orders

## 2011-03-03 NOTE — Assessment & Plan Note (Signed)
UA neg other than trace blood, likely due to IC. Will send for culture.

## 2011-03-03 NOTE — Progress Notes (Signed)
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 No fevers. Has been nauseated but had a GI bug last week.  No vomiting or diarrhea currently.    Denies any other symptoms.  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    Treated for Proteus UTI last month with 12 days of cipro (sensitive to cipro). Wants repeat UA today. Denies any worsening symptoms but has IC so difficult to tell when she has an infection as she always has increased urgency and often blood.  The PMH, PSH, Social History, Family History, Medications, and allergies have been reviewed in [facility], and have been updated if relevant.   Review of Systems       See HPI General:  Complains of fever and malaise. ENT:  Complains of earache and nasal congestion. Resp:  Complains of cough; denies sputum productive and wheezing. GI:  Complains of diarrhea and nausea; denies vomiting.  Physical Exam BP 120/70  Pulse 83  Temp(Src) 97.6 F (36.4 C) (Oral)  Ht 5\' 10"  (1.778 m)  Wt 160 lb 1.9 oz (72.63 kg)  BMI 22.97 kg/m2  LMP 01/22/2011 General:  alert, well-developed, well-nourished, and well-hydrated.   Ears:  TMs retracted with increased fluid bilat Mouth:   pharyngeal erythema, some exudates.   Lungs:  , no crackles and no wheezes.   Heart:  Normal rate and regular rhythm. S1 and S2 normal without gallop, murmur, click, rub or other extra sounds. Abdomen:  soft, normal bowel sound

## 2011-03-03 NOTE — Assessment & Plan Note (Signed)
New.  Likely viral. Supportive care as per pt instructions.

## 2011-03-05 LAB — URINE CULTURE: Colony Count: 3000

## 2011-04-06 ENCOUNTER — Ambulatory Visit (INDEPENDENT_AMBULATORY_CARE_PROVIDER_SITE_OTHER): Payer: BC Managed Care – PPO | Admitting: Family Medicine

## 2011-04-06 ENCOUNTER — Encounter: Payer: Self-pay | Admitting: Family Medicine

## 2011-04-06 DIAGNOSIS — B9689 Other specified bacterial agents as the cause of diseases classified elsewhere: Secondary | ICD-10-CM | POA: Insufficient documentation

## 2011-04-06 DIAGNOSIS — J019 Acute sinusitis, unspecified: Secondary | ICD-10-CM

## 2011-04-06 MED ORDER — AMOXICILLIN-POT CLAVULANATE 875-125 MG PO TABS: 1.0000 | ORAL_TABLET | Freq: Two times a day (BID) | ORAL | Status: AC

## 2011-04-06 NOTE — Patient Instructions (Signed)
Drink lots of fluids mucinex if it helps  Aleve with food only -- meals  Try nasal saline spray to irrigate your sinuses or netti pot  Take the augmentin for sinus infection Call if worse or not improved in 10 days

## 2011-04-06 NOTE — Progress Notes (Signed)
  Subjective:    Patient ID: Pamela Calhoun, female    DOB: Apr 01, 1986, 25 y.o.   MRN: [id number]  HPI  This started 1 mo ago with regular cold symptoms Then worse 2 weeks On mucinex and aleve (just started fever on sat) -- with aches  Bad sinus headache - just on the L side above and below eye Dark yellow nasal d/c - with a little blood  Coughing up the same for several days Constant drainage and st too   Review of Systems Review of Systems  Constitutional: Negative for fever, appetite change, and unexpected weight change. pos for fatigue and malaise Eyes: Negative for pain and visual disturbance.  Respiratory: Negative for cough and shortness of breath.   ENT pos for sinus pain and nasal congestion Cardiovascular: Negative. For cp or edema  Gastrointestinal: Negative for nausea, diarrhea and constipation.  Genitourinary: Negative for urgency and frequency.  Skin: Negative for pallor.  Neurological: Negative for weakness, light-headedness, numbness and headaches.  Hematological: Negative for adenopathy. Does not bruise/bleed easily.  Psychiatric/Behavioral: Negative for dysphoric mood. The patient is not nervous/anxious.          Objective:   Physical Exam  Constitutional: She appears well-developed and well-nourished. No distress.  HENT:  Head: Normocephalic and atraumatic.  Right Ear: External ear normal.  Left Ear: External ear normal.  Mouth/Throat: Oropharynx is clear and moist.       Tender L frontal/ ethmoid and max sinus tenderness No swelling Nares are congested and injected   Eyes: Conjunctivae and EOM are normal. Pupils are equal, round, and reactive to light.  Neck: Normal range of motion. Neck supple. No thyromegaly present.  Cardiovascular: Normal rate and normal heart sounds.   Pulmonary/Chest: Effort normal. She has no wheezes. She has no rales.  Lymphadenopathy:    She has no cervical adenopathy.  Neurological: She is alert. She has normal reflexes.    Skin: Skin is warm and dry. No rash noted.  Psychiatric: She has a normal mood and affect.          Assessment & Plan:

## 2011-04-06 NOTE — Assessment & Plan Note (Signed)
Following uri With L sided sinus pain and pressure tx with augmentin sympt care disc- see inst Update if not imp after tx Work note 2 d

## 2011-04-10 NOTE — Op Note (Signed)
[place]  Patient:    Pamela Calhoun, Pamela Calhoun Visit Number: [id number] MRN: [id number]          Service Type: DSU Location: [facility] 2899 21 Attending Physician:  [place] Pho Damodar Proc. Date: 07/14/01 Adm. Date:  07/14/2001 Disc. Date: 07/14/2001   CC:         Cathy L. Orson Aloe, M.D.  Velora Heckler, M.D.   Operative Report  PREOPERATIVE DIAGNOSIS: 1. Malignant melanoma, superficial spreading in the radial growth phase of    top of right foot. 2. Clark level 2. 3. Breslow measurement 0.56 mm.  POSTOPERATIVE DIAGNOSIS: 1. Malignant melanoma, superficial spreading in the radial growth phase of    top of right foot. 2. Clark level 2. 3. Breslow measurement 0.56 mm.  OPERATION PERFORMED:  Wider excision of malignant melanoma of top of right foot.  SURGEON:  Prabhakar D. Levie Heritage, M.D.  ASSISTANT:  Nelida Meuse, M.D.  ANESTHESIA:  Nurse.  INDICATIONS FOR PROCEDURE:  This 25 year old girl was seen by Lynden Ang L. Orson Aloe, M.D. of [place].  On May 17, 2001 she underwent shave excision of a mole of the top of her right foot.  The histopathology report was consistent with malignant melanoma, superficial spreading in radial growth phase of top of the right foot with Clark level 2 and Breslow measurement 0.56 mm.  The case was discussed with Dr. Tedd Sias and Dr. Darnell Level of [facility] and consensus was reached to perform a 1 cm wide excision of the lesion at this time without any sentinel node or groin dissection.  DESCRIPTION OF PROCEDURE:  Under satisfactory general endotracheal anesthesia, with the patient in supine position, the right foot and right leg regions were thoroughly prepped and draped in the usual manner.  An elliptical incision was made around the previous biopsy site of the dorsum of the right foot ____________ margin from the periphery of the lesion.  The skin  and subcutaneous tissues were incised.  Bleeders were individually clamped, cut and electrocoagulated.  By blunt and sharp dissection, a full thickness segment of the skin bearing the lesion and subcutaneous tissue were excised and the bleeders ____________  Mobilization of the skin flap was carried out at this site.  From that the skin could be closed in a straight line with minimal tension.  Subcutaneous tissues apposed with 5-0 Vicryl, skin closed with 5-0 nylon subcuticular sutures.  Steri-Strips placed overtop this. Dressing applied.  Throughout the procedure, the patients vital signs remained stable.  The patient withstood the procedure well and was transferred to the recovery room in satisfactory general condition. Attending Physician:  Carlos Levering DD:  07/14/01 TD:  07/15/01 Job: [id number]

## 2011-04-10 NOTE — Op Note (Signed)
Pamela Calhoun, Pamela Calhoun                        ACCOUNT NO.:  [id number]   MEDICAL RECORD NO.:  [id number]                   PATIENT TYPE:  AMB   LOCATION:  [facility]                                 FACILITY:  [facility]   PHYSICIAN:  Michelle L. Vincente Poli, M.D.            DATE OF BIRTH:  Apr 10, 1986   DATE OF PROCEDURE:  06/13/2004  DATE OF DISCHARGE:  06/13/2004                                 OPERATIVE REPORT   PREOPERATIVE DIAGNOSIS:  Pelvic pain and history of endometriosis.   POSTOPERATIVE DIAGNOSIS:  Pelvic pain and history of endometriosis.   OPERATION PERFORMED:  Diagnostic laparoscopy.   SURGEON:  Michelle L. Vincente Poli, M.D.   ANESTHESIA:  General.   ESTIMATED BLOOD LOSS:  Minimal.   DESCRIPTION OF PROCEDURE:  The patient was taken to the operating room.  She  was then intubated without difficulty.  She was placed in the lithotomy  position and she was prepped and draped in the usual sterile fashion and in  and out catheter was used to empty the bladder.  We then made a small  infraumbilical incision and the Veress needle was inserted without any  difficulty.  Pneumoperitoneum was then achieved.  The Veress needle was  removed and then after removal of the Veress needle, the 10-11 mm trocar was  inserted without difficulty.  We then placed the laparoscope through the  trocar sheath and the patient was placed in Trendelenburg position.  With  excellent visualization, we were able to inspect the entire pelvis.  The  uterus was normal.  The ovaries were normal.  The tubes were normal.  There  were no adhesions.  The previous powder burn lesion that we had seen on the  left side was not present.  There was no evidence of endometriosis.  The  pneumoperitoneum was released, the trocar was then removed, the incision was  closed using 3-0 Vicryl suture interrupted.  Local was infiltrated into this  incision site and the patient was extubated and went to recovery room in  stable condition.  All  sponge, lap and instrument counts were correct times  two.                                               Michelle L. Vincente Poli, M.D.    Florestine Avers  D:  06/16/2004  T:  06/16/2004  Job:  [id number]

## 2011-06-10 ENCOUNTER — Ambulatory Visit: Payer: BC Managed Care – PPO | Admitting: Family Medicine

## 2011-06-10 ENCOUNTER — Ambulatory Visit (INDEPENDENT_AMBULATORY_CARE_PROVIDER_SITE_OTHER): Payer: BC Managed Care – PPO | Admitting: Family Medicine

## 2011-06-10 ENCOUNTER — Encounter: Payer: Self-pay | Admitting: Family Medicine

## 2011-06-10 VITALS — BP 110/62 | HR 82 | Temp 97.9°F | Wt 155.0 lb

## 2011-06-10 DIAGNOSIS — R102 Pelvic and perineal pain: Secondary | ICD-10-CM

## 2011-06-10 DIAGNOSIS — R300 Dysuria: Secondary | ICD-10-CM

## 2011-06-10 DIAGNOSIS — N739 Female pelvic inflammatory disease, unspecified: Secondary | ICD-10-CM

## 2011-06-10 DIAGNOSIS — N949 Unspecified condition associated with female genital organs and menstrual cycle: Secondary | ICD-10-CM

## 2011-06-10 LAB — POCT URINALYSIS DIPSTICK
Bilirubin, UA: NEGATIVE
Ketones, UA: NEGATIVE
Spec Grav, UA: 1.025
pH, UA: 5

## 2011-06-10 LAB — POCT URINE PREGNANCY: Preg Test, Ur: NEGATIVE

## 2011-06-10 MED ORDER — LIDOCAINE HCL 1 % IJ SOLN
250.0000 mg | Freq: Once | INTRAMUSCULAR | Status: AC
  Administered 2011-06-10: 250 mg via INTRAMUSCULAR

## 2011-06-10 MED ORDER — DOXYCYCLINE HYCLATE 100 MG PO TABS: 100.0000 mg | ORAL_TABLET | Freq: Two times a day (BID) | ORAL | Status: AC

## 2011-06-10 MED ORDER — METRONIDAZOLE 500 MG PO TABS: 500.0000 mg | ORAL_TABLET | Freq: Two times a day (BID) | ORAL | Status: AC

## 2011-06-10 NOTE — Progress Notes (Signed)
Pamela Calhoun, a 25 y.o. female with a remote h/o PID presents today in the office for the following:    Having pelvic pain -- all across abdomen Pain with urinating, not urinating and both.  Worsening in the past few days  Yellowish, cream discharge -- for about a week.  Used a tampon about 05/24/2011 - normally never does  Monogamous with boyfriend x 4 years.  No known STD's Prior history reviewed No n/v/d  LMP 05/24/2011  Patient Active Problem List  Diagnoses  . DEPRESSION  . ALLERGIC RHINITIS  . INTERSTITIAL CYSTITIS  . SKIN CANCER, HX OF  . Acute sinusitis   Past Medical History  Diagnosis Date  . Allergy   . Cancer     skin- hystory of melanoma   . Depression   . Ureteral reflux     as a child   . PID (acute pelvic inflammatory disease)   . Endometriosis   . Interstitial cystitis   . Frequent UTI   . History of shingles   . Pap smear, abnormal     cryosx   . Fracture     right foot   Past Surgical History  Procedure Date  . Tonsillectomy   . Melanoma excision     Right leg   . Laparoscopy     pelvic    History  Substance Use Topics  . Smoking status: Former Games developer  . Smokeless tobacco: Not on file  . Alcohol Use: Not on file   Family History  Problem Relation Age of Onset  . Cancer Maternal Grandmother     breast   Allergies  Allergen Reactions  . Cetirizine Hcl     REACTION: diarrhea  . Paroxetine     REACTION: nausea   Current Outpatient Prescriptions on File Prior to Visit  Medication Sig Dispense Refill  . hydrOXYzine (VISTARIL) 25 MG/ML injection Inject into the muscle daily.        Clelia Schaumann Estrad 91-Day 0.15-0.03 &0.01 MG TABS Take by mouth. As directed        . pentosan polysulfate (ELMIRON) 100 MG capsule Take 100 mg by mouth. Take 2 by mouth at bedtime       . guaiFENesin-codeine (ROBITUSSIN AC) 100-10 MG/5ML syrup Take 5 mLs by mouth 2 (two) times daily as needed for cough.  240 mL  0  . promethazine (PHENERGAN) 25  MG tablet Take 25 mg by mouth. 1 by mouth up to three times a day as needed nausea watch out of sedation        No current facility-administered medications on file prior to visit.   ROS: GEN: as above GI: No n/v/d GU: above Pulm: No SOB Interactive and getting along well at home.  Otherwise, ROS is as per the HPI.   Physical Exam  Blood pressure 110/62, pulse 82, temperature 97.9 F (36.6 C), temperature source Oral, weight 155 lb (70.308 kg).  GEN: WDWN, NAD, Non-toxic, A & O x 3.  HEENT: Atraumatic, Normocephalic. Neck supple. No masses, No LAD. Ears and Nose: No external deformity. Abdomen: S, B LQ ttp, + BS, nondistended GU: normal introitus. No odor. No significant amount of discharge. Cervix appears normal. Notable tenderness with cervical motion and in the adnexal region R and L. EXTR: No c/c/e NEURO Normal gait.  PSYCH: Normally interactive. Conversant. Not depressed or anxious appearing.  Calm demeanor.   Assessment and Plan: 1.  PID: new. Wet prep neg. No trich. No yeast. No clue cells.  Check GC and CMZ Clinical dx Rocephin 250 mg IM Flagyl 500 mg po bid x 14 d Doxy 100 mg po bid x 14 d

## 2011-06-11 LAB — GC/CHLAMYDIA PROBE AMP, GENITAL
Chlamydia, DNA Probe: NEGATIVE
GC Probe Amp, Genital: NEGATIVE

## 2011-06-12 LAB — URINE CULTURE: Organism ID, Bacteria: NO GROWTH

## 2011-08-05 ENCOUNTER — Ambulatory Visit (INDEPENDENT_AMBULATORY_CARE_PROVIDER_SITE_OTHER): Payer: BC Managed Care – PPO | Admitting: Family Medicine

## 2011-08-05 ENCOUNTER — Encounter: Payer: Self-pay | Admitting: Family Medicine

## 2011-08-05 DIAGNOSIS — J029 Acute pharyngitis, unspecified: Secondary | ICD-10-CM

## 2011-08-05 MED ORDER — AMOXICILLIN 500 MG PO CAPS: 1000.0000 mg | ORAL_CAPSULE | Freq: Two times a day (BID) | ORAL | Status: AC

## 2011-08-05 NOTE — Patient Instructions (Addendum)
Take Amox. Take Guaifenesin (400mg ), take 11/2 tabs by mouth AM and NOON. Get GUAIFENESIN by  going to [facility] or [facility] and getting MUCOUS RELIEF EXPECTORANT/CONGESTION. DO NOT GET MUCINEX (Timed Release Guaifenesin)  Drink lots of fluids. Tylenol Aleve 2 tabs twice a day as needed. Gargle with 30ccs of warm salt water every half hour for 2 days as able. Return if sxs don't resolve. Out of work tomorrow.

## 2011-08-05 NOTE — Assessment & Plan Note (Signed)
Has fever chills ST Lymph nodes and congestion. Will treat. See instructions.

## 2011-08-05 NOTE — Progress Notes (Signed)
  Subjective:    Patient ID: Pamela Calhoun, female    DOB: 13-May-1986, 25 y.o.   MRN: [id number]  HPI Pt of Dr Royden Purl here with Mom as acute appt for fever and bodyaches.  She has had fever to 101.6, with body aches. She had a rash yesterday on the face with little red spots. 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 She had fever to 101.6, shehas had headache right frontal area which comes and goes, no ear pain, some rhinitis that is yellow, some ST, cough productive a little of sometimes bloody yellow spututm. She has been nauseated, no vomitting, no diarrhea. She has taken IBP for fever once today.    Review of SystemsNoncontributory except as above.       Objective:   Physical Exam  Constitutional: She appears well-developed and well-nourished. No distress.  HENT:  Head: Normocephalic and atraumatic.  Right Ear: External ear normal.  Left Ear: External ear normal.  Nose: Nose normal.  Mouth/Throat: Oropharynx is clear and moist. No oropharyngeal exudate.       Tender over the maxillary sinuses bilat. Erythema over the tonsillar pillars bilat w/o exudate.  Eyes: Conjunctivae and EOM are normal. Pupils are equal, round, and reactive to light.  Neck: Normal range of motion. Neck supple. No thyromegaly present.  Cardiovascular: Normal rate, regular rhythm and normal heart sounds.   Pulmonary/Chest: Effort normal and breath sounds normal. She has no wheezes (Minimal ronchi in the bases bilat.). She has no rales.  Lymphadenopathy:    Cervical adenopathy: shoddy and mobile bilaterally.  Skin: She is not diaphoretic.          Assessment & Plan:

## 2011-08-07 ENCOUNTER — Telehealth: Payer: Self-pay | Admitting: *Deleted

## 2011-08-07 ENCOUNTER — Encounter: Payer: Self-pay | Admitting: Family Medicine

## 2011-08-07 NOTE — Telephone Encounter (Signed)
Letter is done and in IN box  F/u next week if not improving

## 2011-08-07 NOTE — Telephone Encounter (Signed)
Pt was seen on Wednesday by Dr. Hetty Ely and given an out of work note for Wednesday and Thursday.  She was told she could get one for today if needed.  She wasn't able to go back today so she will need another note.  Please call when note is ready.

## 2011-08-07 NOTE — Telephone Encounter (Signed)
Left message for pt to call back. Letter is in my basket at my desk until speak with pt.

## 2011-08-13 NOTE — Telephone Encounter (Signed)
Left vm for pt to callback 

## 2011-08-17 NOTE — Telephone Encounter (Signed)
Patient notified as instructed by telephone. Letter is at front desk for pick up. Pt said she is feeling much better now. Pt will call back if needed.

## 2012-02-08 ENCOUNTER — Ambulatory Visit: Payer: BC Managed Care – PPO | Admitting: Family Medicine

## 2012-02-09 ENCOUNTER — Encounter: Payer: Self-pay | Admitting: *Deleted

## 2012-02-09 ENCOUNTER — Encounter: Payer: Self-pay | Admitting: Family Medicine

## 2012-02-09 ENCOUNTER — Telehealth: Payer: Self-pay | Admitting: *Deleted

## 2012-02-09 ENCOUNTER — Ambulatory Visit (INDEPENDENT_AMBULATORY_CARE_PROVIDER_SITE_OTHER): Payer: BC Managed Care – PPO | Admitting: Family Medicine

## 2012-02-09 DIAGNOSIS — J019 Acute sinusitis, unspecified: Secondary | ICD-10-CM

## 2012-02-09 MED ORDER — AMOXICILLIN-POT CLAVULANATE 875-125 MG PO TABS: 1.0000 | ORAL_TABLET | Freq: Two times a day (BID) | ORAL | Status: AC

## 2012-02-09 NOTE — Progress Notes (Signed)
  Subjective:    Patient ID: Pamela Calhoun, female    DOB: 08/31/1986, 26 y.o.   MRN: [id number]  HPI CC: sinusitis?  Cold sxs for 2 wks.  4d ago started having facial pain, cough, fever to 101.5.  Entire body aches.  Blowing nose with dark yellow mucous.  Mildly productive when coughing.  Some nausea.  R ear pain.  So far has tried mucinex, tylenol ES and advil.  No vomiting, diarrhea, tooth pain.  No sick contacts at home.  Works at [employer].  Mom smokes at home.  No h/o asthma.  Review of Systems per HPI    Objective:   Physical Exam  Nursing note and vitals reviewed. Constitutional: She appears well-developed and well-nourished. No distress.       Evidently congested  HENT:  Head: Normocephalic and atraumatic.  Right Ear: Hearing, tympanic membrane, external ear and ear canal normal.  Left Ear: Hearing, tympanic membrane, external ear and ear canal normal.  Nose: No mucosal edema or rhinorrhea. Right sinus exhibits maxillary sinus tenderness and frontal sinus tenderness. Left sinus exhibits maxillary sinus tenderness and frontal sinus tenderness.  Mouth/Throat: Uvula is midline, oropharynx is clear and moist and mucous membranes are normal. No oropharyngeal exudate, posterior oropharyngeal edema, posterior oropharyngeal erythema or tonsillar abscesses.       Fluid behind R TM  Eyes: Conjunctivae and EOM are normal. Pupils are equal, round, and reactive to light. No scleral icterus.  Neck: Normal range of motion. Neck supple.  Cardiovascular: Normal rate, regular rhythm, normal heart sounds and intact distal pulses.   No murmur heard. Pulmonary/Chest: Effort normal and breath sounds normal. No respiratory distress. She has no wheezes. She has no rales.  Lymphadenopathy:    She has no cervical adenopathy.  Skin: Skin is warm and dry. No rash noted.       Assessment & Plan:

## 2012-02-09 NOTE — Assessment & Plan Note (Signed)
Given duration and progression, treat with augmentin twice daily for 10 days. Update [place] if not improving with treatment. See pt instructions for further recs. Pt requests 2 notes for work.

## 2012-02-09 NOTE — Telephone Encounter (Signed)
Rx from this AM was not sent in. I called in Augmentin 875 1 PO BID #20 with 0 RF per office note to [facility].

## 2012-02-09 NOTE — Patient Instructions (Addendum)
You have a sinus infection. Take medicine as prescribed: augmentin for 10 days Ibuprofen for headache. Push fluids and plenty of rest. Nasal saline irrigation or neti pot to help drain sinuses. May use simple mucinex with plenty of fluid to help mobilize mucous. Let us know if fever >101.5, trouble opening/closing mouth, difficulty swallowing, or worsening - you may need to be seen again.

## 2012-02-09 NOTE — Telephone Encounter (Signed)
Noted.  i sent in electronically.  Please ask pt to use 2nd form birth control while on abx.

## 2012-02-10 NOTE — Telephone Encounter (Signed)
Patient notified

## 2012-05-31 ENCOUNTER — Other Ambulatory Visit: Payer: Self-pay | Admitting: Obstetrics and Gynecology

## 2012-10-10 ENCOUNTER — Ambulatory Visit (INDEPENDENT_AMBULATORY_CARE_PROVIDER_SITE_OTHER): Payer: BC Managed Care – PPO | Admitting: Family Medicine

## 2012-10-10 ENCOUNTER — Encounter: Payer: Self-pay | Admitting: Family Medicine

## 2012-10-10 VITALS — BP 124/96 | HR 95 | Temp 97.8°F | Ht 70.0 in | Wt 169.0 lb

## 2012-10-10 DIAGNOSIS — J019 Acute sinusitis, unspecified: Secondary | ICD-10-CM

## 2012-10-10 DIAGNOSIS — B9689 Other specified bacterial agents as the cause of diseases classified elsewhere: Secondary | ICD-10-CM

## 2012-10-10 MED ORDER — BENZONATATE 200 MG PO CAPS: 200.0000 mg | ORAL_CAPSULE | Freq: Three times a day (TID) | ORAL | Status: DC | PRN

## 2012-10-10 MED ORDER — AMOXICILLIN-POT CLAVULANATE 875-125 MG PO TABS: 1.0000 | ORAL_TABLET | Freq: Two times a day (BID) | ORAL | Status: DC

## 2012-10-10 NOTE — Assessment & Plan Note (Signed)
With fever / facial pain 2 weeks after uri tx with augmentin  Tessalon for cough Disc symptomatic care - see instructions on AVS  Update if not starting to improve in a week or if worsening

## 2012-10-10 NOTE — Patient Instructions (Addendum)
Drink lots of fluids and try to get rest  Take augmentin for sinus infection Tessalon for cough  mucinex D - just one pill at a time Nasal saline spray  Ibuprofen with food as needed for ear pain/ facial pain and pressure  Out of work today

## 2012-10-10 NOTE — Progress Notes (Signed)
Subjective:    Patient ID: Pamela Calhoun, female    DOB: 07-Jan-1986, 26 y.o.   MRN: [id number]  HPI Here for sinus symptoms Had a cold for 2 weeks - and now getting worse Sinus pain/ ear pain/ ear fullness/ ST  Chest is tight with prod cough   Felt feverish last night - body aches   Took mucinex D-- made her bp high  Tylenol cold and flu  Patient Active Problem List  Diagnosis  . DEPRESSION  . ALLERGIC RHINITIS  . INTERSTITIAL CYSTITIS  . SKIN CANCER, HX OF  . Acute sinusitis  . Pelvic inflammatory disease (PID)  . Pharyngitis, acute   Past Medical History  Diagnosis Date  . Allergy   . Cancer     skin- hystory of melanoma   . Depression   . Ureteral reflux     as a child   . PID (acute pelvic inflammatory disease)   . Endometriosis   . Interstitial cystitis   . Frequent UTI   . History of shingles   . Pap smear, abnormal     cryosx   . Fracture     right foot   Past Surgical History  Procedure Date  . Tonsillectomy   . Melanoma excision     Right leg   . Laparoscopy     pelvic    History  Substance Use Topics  . Smoking status: Former Games developer  . Smokeless tobacco: Never Used     Comment: quit 2 years ago  . Alcohol Use: Yes     Comment: occassionally   Family History  Problem Relation Age of Onset  . Cancer Maternal Grandmother     breast   Allergies  Allergen Reactions  . Cetirizine Hcl     REACTION: diarrhea  . Paroxetine     REACTION: nausea  . Vicodin (Hydrocodone-Acetaminophen) Itching   Current Outpatient Prescriptions on File Prior to Visit  Medication Sig Dispense Refill  . Levonorgest-Eth Estrad 91-Day 0.15-0.03 &0.01 MG TABS Take by mouth. As directed        . pentosan polysulfate (ELMIRON) 100 MG capsule Take 100 mg by mouth. Take 2 by mouth at bedtime           Review of Systems Review of Systems  Constitutional: Negative for  appetite change,  and unexpected weight change.  Eyes: Negative for pain and visual  disturbance.  ENT pos for cong and sinus pain  Respiratory: Negative for wheeze and shortness of breath.   Cardiovascular: Negative for cp or palpitations    Gastrointestinal: Negative for nausea, diarrhea and constipation.  Genitourinary: Negative for urgency and frequency.  Skin: Negative for pallor or rash   Neurological: Negative for weakness, light-headedness, numbness and headaches.  Hematological: Negative for adenopathy. Does not bruise/bleed easily.  Psychiatric/Behavioral: Negative for dysphoric mood. The patient is not nervous/anxious.         Objective:   Physical Exam  Constitutional: She appears well-developed and well-nourished. No distress.  HENT:  Head: Normocephalic and atraumatic.  Right Ear: External ear normal.  Left Ear: External ear normal.  Mouth/Throat: Oropharynx is clear and moist.       Nares are injected and congested  bilat maxillary sinus tenderness  Eyes: Conjunctivae normal and EOM are normal. Pupils are equal, round, and reactive to light. Right eye exhibits no discharge. Left eye exhibits no discharge.  Neck: Normal range of motion. Neck supple. No thyromegaly present.  Cardiovascular: Normal rate and regular rhythm.  Pulmonary/Chest: Effort normal and breath sounds normal. No respiratory distress. She has no wheezes.  Lymphadenopathy:    She has no cervical adenopathy.  Neurological: She is alert. No cranial nerve deficit.  Skin: Skin is warm and dry. No rash noted. No erythema.  Psychiatric: She has a normal mood and affect.          Assessment & Plan:

## 2012-10-23 ENCOUNTER — Emergency Department: Payer: Self-pay | Admitting: Emergency Medicine

## 2012-10-23 LAB — URINALYSIS, COMPLETE
Bacteria: NONE SEEN
Glucose,UR: NEGATIVE mg/dL (ref 0–75)
Nitrite: NEGATIVE
Ph: 6 (ref 4.5–8.0)
Protein: NEGATIVE
RBC,UR: 3 /HPF (ref 0–5)
WBC UR: 2 /HPF (ref 0–5)

## 2012-10-23 LAB — COMPREHENSIVE METABOLIC PANEL
Albumin: 4 g/dL (ref 3.4–5.0)
Alkaline Phosphatase: 108 U/L (ref 50–136)
Anion Gap: 7 (ref 7–16)
BUN: 8 mg/dL (ref 7–18)
Calcium, Total: 9 mg/dL (ref 8.5–10.1)
Chloride: 106 mmol/L (ref 98–107)
Creatinine: 0.71 mg/dL (ref 0.60–1.30)
EGFR (Non-African Amer.): >60
Osmolality: 269 (ref 275–301)
SGOT(AST): 19 U/L (ref 15–37)
SGPT (ALT): 14 U/L (ref 12–78)
Sodium: 136 mmol/L (ref 136–145)

## 2012-10-23 LAB — CBC
HCT: 40 % (ref 35.0–47.0)
HGB: 13.4 g/dL (ref 12.0–16.0)
MCHC: 33.4 g/dL (ref 32.0–36.0)
MCV: 89 fL (ref 80–100)
RBC: 4.5 10*6/uL (ref 3.80–5.20)
RDW: 12.9 % (ref 11.5–14.5)

## 2012-10-24 LAB — LIPASE, BLOOD: Lipase: 82 U/L (ref 73–393)

## 2012-12-19 ENCOUNTER — Encounter: Payer: Self-pay | Admitting: Family Medicine

## 2012-12-19 ENCOUNTER — Ambulatory Visit (INDEPENDENT_AMBULATORY_CARE_PROVIDER_SITE_OTHER): Payer: BC Managed Care – PPO | Admitting: Family Medicine

## 2012-12-19 VITALS — BP 110/66 | HR 130 | Temp 97.5°F | Ht 70.0 in | Wt 166.8 lb

## 2012-12-19 DIAGNOSIS — J029 Acute pharyngitis, unspecified: Secondary | ICD-10-CM

## 2012-12-19 DIAGNOSIS — J019 Acute sinusitis, unspecified: Secondary | ICD-10-CM

## 2012-12-19 DIAGNOSIS — J209 Acute bronchitis, unspecified: Secondary | ICD-10-CM | POA: Insufficient documentation

## 2012-12-19 DIAGNOSIS — B9689 Other specified bacterial agents as the cause of diseases classified elsewhere: Secondary | ICD-10-CM

## 2012-12-19 MED ORDER — ALBUTEROL SULFATE HFA 108 (90 BASE) MCG/ACT IN AERS: 2.0000 | INHALATION_SPRAY | RESPIRATORY_TRACT | Status: DC | PRN

## 2012-12-19 MED ORDER — LEVOFLOXACIN 500 MG PO TABS: 500.0000 mg | ORAL_TABLET | Freq: Every day | ORAL | Status: DC

## 2012-12-19 MED ORDER — FLUCONAZOLE 150 MG PO TABS: 150.0000 mg | ORAL_TABLET | Freq: Once | ORAL | Status: DC

## 2012-12-19 MED ORDER — GUAIFENESIN-CODEINE 100-10 MG/5ML PO SYRP: 5.0000 mL | ORAL_SOLUTION | Freq: Four times a day (QID) | ORAL | Status: DC | PRN

## 2012-12-19 NOTE — Patient Instructions (Addendum)
Drink lots of fluids and rest  Take the levaquin for sinus infx and bronchitis Use cough syrup with caution  Inhaler as needed for wheezing - if wheezing worsens call so we can start you on prednisone  Off work until wed- then see how you are feeling  Flu shots annually from now on

## 2012-12-19 NOTE — Assessment & Plan Note (Signed)
S/p influenza in former smoker - with very mild reactive airway symptoms (and persistent cough) tx with albuterol mdi and also robitussin ac Fluids/ rest -off work until wed Update if not starting to improve in a week or if worsening  (esp if worse wheeze)

## 2012-12-19 NOTE — Assessment & Plan Note (Signed)
S/p influenza with cong/cough / low grade fever and facial pain (purulent nasal drainage) tx with levaquin (has failed partial course of amox) Disc symptomatic care - see instructions on AVS  Update if not starting to improve in a week or if worsening

## 2012-12-19 NOTE — Progress Notes (Signed)
Subjective:    Patient ID: Pamela Calhoun, female    DOB: Feb 08, 1986, 27 y.o.   MRN: [id number]  HPI Here with uri symptoms and facial pain   Has been sick for 2 weeks Started with influenza - really bad cough (dx by nasal swab was the influenza A)  A lot of resp illnesses lately   Very congested in nose Face hurts Chest hurts too  Still a lot of cough - mucous - is dark yellow   Some wheezing   otc taking sudafed/ alka selzer/ mucinex   Took 4 days of left over PCN - amox -did not help    She did not get a flu shot    Patient Active Problem List  Diagnosis  . DEPRESSION  . ALLERGIC RHINITIS  . INTERSTITIAL CYSTITIS  . SKIN CANCER, HX OF  . Pelvic inflammatory disease (PID)   Past Medical History  Diagnosis Date  . Allergy   . Cancer     skin- hystory of melanoma   . Depression   . Ureteral reflux     as a child   . PID (acute pelvic inflammatory disease)   . Endometriosis   . Interstitial cystitis   . Frequent UTI   . History of shingles   . Pap smear, abnormal     cryosx   . Fracture     right foot   Past Surgical History  Procedure Date  . Tonsillectomy   . Melanoma excision     Right leg   . Laparoscopy     pelvic    History  Substance Use Topics  . Smoking status: Former Games developer  . Smokeless tobacco: Never Used     Comment: quit 2 years ago  . Alcohol Use: Yes     Comment: occassionally   Family History  Problem Relation Age of Onset  . Cancer Maternal Grandmother     breast   Allergies  Allergen Reactions  . Cetirizine Hcl     REACTION: diarrhea  . Paroxetine     REACTION: nausea  . Vicodin (Hydrocodone-Acetaminophen) Itching   Current Outpatient Prescriptions on File Prior to Visit  Medication Sig Dispense Refill  . Levonorgest-Eth Estrad 91-Day 0.15-0.03 &0.01 MG TABS Take by mouth. As directed        . pentosan polysulfate (ELMIRON) 100 MG capsule Take 100 mg by mouth. Take 2 by mouth at bedtime            Review of  Systems Review of Systems  Constitutional: Negative for fever, appetite change,  and unexpected weight change.  ENT pos for cong and facial pain and ST Eyes: Negative for pain and visual disturbance.  Respiratory: Negative for wheeze  and shortness of breath.   Cardiovascular: Negative for cp or palpitations    Gastrointestinal: Negative for nausea, diarrhea and constipation.  Genitourinary: Negative for urgency and frequency.  Skin: Negative for pallor or rash   Neurological: Negative for weakness, light-headedness, numbness and headaches.  Hematological: Negative for adenopathy. Does not bruise/bleed easily.  Psychiatric/Behavioral: Negative for dysphoric mood. The patient is not nervous/anxious.         Objective:   Physical Exam  Constitutional: She appears well-developed and well-nourished. No distress.  HENT:  Head: Normocephalic and atraumatic.  Right Ear: External ear normal.  Left Ear: External ear normal.  Mouth/Throat: Oropharynx is clear and moist. No oropharyngeal exudate.       Nares are injected and congested   bilat maxillary sinus  tenderness  Eyes: Conjunctivae normal and EOM are normal. Pupils are equal, round, and reactive to light. Right eye exhibits no discharge. Left eye exhibits no discharge. No scleral icterus.  Neck: Neck supple.  Cardiovascular: Regular rhythm and normal heart sounds.        HR tachycardic - low 90s at rest  Pulmonary/Chest: Effort normal and breath sounds normal. No respiratory distress. She has no wheezes. She has no rales. She exhibits no tenderness.       Diffuse rhonchi with good air exch  Abdominal: Soft. Bowel sounds are normal.  Lymphadenopathy:    She has no cervical adenopathy.  Skin: Skin is warm and dry. No rash noted. No erythema. No pallor.  Psychiatric: She has a normal mood and affect.          Assessment & Plan:

## 2013-04-03 ENCOUNTER — Encounter: Payer: Self-pay | Admitting: Family Medicine

## 2013-04-03 ENCOUNTER — Ambulatory Visit (INDEPENDENT_AMBULATORY_CARE_PROVIDER_SITE_OTHER): Payer: BC Managed Care – PPO | Admitting: Family Medicine

## 2013-04-03 VITALS — BP 104/68 | HR 88 | Temp 98.3°F | Ht 70.0 in | Wt 158.0 lb

## 2013-04-03 DIAGNOSIS — J019 Acute sinusitis, unspecified: Secondary | ICD-10-CM | POA: Insufficient documentation

## 2013-04-03 MED ORDER — AMOXICILLIN-POT CLAVULANATE 875-125 MG PO TABS: 1.0000 | ORAL_TABLET | Freq: Two times a day (BID) | ORAL | Status: DC

## 2013-04-03 NOTE — Patient Instructions (Addendum)
Drink lots of water and try to get rest  Take the augmentin as directed  Try mucinex to loosen congestion along with nasal saline spray  Update if not starting to improve in a week or if worsening

## 2013-04-03 NOTE — Assessment & Plan Note (Signed)
With facial pain and fever/ congestion/purulent nasal d/c (also allergy season and she is prone to these) tx with augmentin 10 d Fluids/ rest Disc symptomatic care - see instructions on AVS  Update if not starting to improve in a week or if worsening

## 2013-04-03 NOTE — Progress Notes (Signed)
Subjective:    Patient ID: Pamela Calhoun, female    DOB: 04/28/1986, 27 y.o.   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 Pelvic inflammatory disease (PID) 06/10/2011  . INTERSTITIAL CYSTITIS 06/25/2010  . DEPRESSION 03/22/2007  . ALLERGIC RHINITIS 03/22/2007  . SKIN CANCER, HX OF 03/22/2007   Past Medical History  Diagnosis Date  . Allergy   . Cancer     skin- hystory of melanoma   . Depression   . Ureteral reflux     as a child   . PID (acute pelvic inflammatory disease)   . Endometriosis   . Interstitial cystitis   . Frequent UTI   . History of shingles   . Pap smear, abnormal     cryosx   . Fracture     right foot   Past Surgical History  Procedure Laterality Date  . Tonsillectomy    . Melanoma excision      Right leg   . Laparoscopy      pelvic    History  Substance Use Topics  . Smoking status: Former Games developer  . Smokeless tobacco: Never Used     Comment: quit 2 years ago  . Alcohol Use: Yes     Comment: occassionally   Family History  Problem Relation Age of Onset  . Cancer Maternal Grandmother     breast   Allergies  Allergen Reactions  . Cetirizine Hcl     REACTION: diarrhea  . Paroxetine     REACTION: nausea  . Vicodin (Hydrocodone-Acetaminophen) Itching   Current Outpatient Prescriptions on File Prior to Visit  Medication Sig Dispense Refill  . albuterol (PROVENTIL HFA;VENTOLIN HFA) 108 (90 BASE) MCG/ACT inhaler Inhale 2 puffs into the lungs every  4 (four) hours as needed for wheezing.  1 Inhaler  0  . hydrOXYzine (VISTARIL) 25 MG capsule Take 25 mg by mouth daily.      Clelia Schaumann Estrad 91-Day 0.15-0.03 &0.01 MG TABS Take by mouth. As directed        . pentosan polysulfate (ELMIRON) 100 MG capsule Take 100 mg by mouth. Take 2 by mouth at bedtime       . guaiFENesin-codeine (ROBITUSSIN AC) 100-10 MG/5ML syrup Take 5 mLs by mouth 4 (four) times daily as needed for cough (watch out for sedation).  120 mL  0   No current facility-administered medications on file prior to visit.      Review of Systems Review of Systems  Constitutional: Negative for fever, appetite change,  and unexpected weight change.  ENt pos for sinus pain / congestion/ ST and ear fullness  Eyes: Negative for pain and visual disturbance.  Respiratory: Negative for  shortness of breath.   Cardiovascular: Negative for cp or palpitations    Gastrointestinal: Negative for nausea, diarrhea and constipation.  Genitourinary: Negative for urgency and frequency.  Skin: Negative for pallor or rash   Neurological: Negative  for weakness, light-headedness, numbness and headaches.  Hematological: Negative for adenopathy. Does not bruise/bleed easily.  Psychiatric/Behavioral: Negative for dysphoric mood. The patient is not nervous/anxious.         Objective:   Physical Exam  Constitutional: She appears well-developed and well-nourished. No distress.  HENT:  Head: Normocephalic and atraumatic.  Right Ear: External ear normal.  Left Ear: External ear normal.  Mouth/Throat: Oropharynx is clear and moist.  Nares are injected and congested  bilat maxillary sinus tenderness   Eyes: Conjunctivae and EOM are normal. Pupils are equal, round, and reactive to light. Right eye exhibits no discharge. Left eye exhibits no discharge.  Neck: Normal range of motion. Neck supple. No thyromegaly present.  Cardiovascular: Normal rate and regular rhythm.   Pulmonary/Chest: Effort  normal and breath sounds normal. No respiratory distress. She has no wheezes. She has no rales.  Lymphadenopathy:    She has no cervical adenopathy.  Neurological: She is alert. No cranial nerve deficit.  Skin: Skin is warm and dry. No rash noted. No erythema.  Psychiatric: She has a normal mood and affect.          Assessment & Plan:

## 2013-04-10 ENCOUNTER — Ambulatory Visit: Payer: BC Managed Care – PPO | Admitting: Family Medicine

## 2013-06-21 ENCOUNTER — Other Ambulatory Visit: Payer: Self-pay | Admitting: Obstetrics and Gynecology

## 2013-09-11 ENCOUNTER — Ambulatory Visit (INDEPENDENT_AMBULATORY_CARE_PROVIDER_SITE_OTHER): Payer: BC Managed Care – PPO | Admitting: Family Medicine

## 2013-09-11 ENCOUNTER — Encounter: Payer: Self-pay | Admitting: Family Medicine

## 2013-09-11 VITALS — BP 110/78 | HR 120 | Temp 98.0°F | Ht 70.0 in | Wt 157.5 lb

## 2013-09-11 DIAGNOSIS — J209 Acute bronchitis, unspecified: Secondary | ICD-10-CM

## 2013-09-11 DIAGNOSIS — A499 Bacterial infection, unspecified: Secondary | ICD-10-CM

## 2013-09-11 DIAGNOSIS — B9689 Other specified bacterial agents as the cause of diseases classified elsewhere: Secondary | ICD-10-CM | POA: Insufficient documentation

## 2013-09-11 MED ORDER — GUAIFENESIN-CODEINE 100-10 MG/5ML PO SYRP: 5.0000 mL | ORAL_SOLUTION | Freq: Every evening | ORAL | Status: DC | PRN

## 2013-09-11 MED ORDER — AZITHROMYCIN 250 MG PO TABS: ORAL_TABLET | ORAL | Status: DC

## 2013-09-11 NOTE — Patient Instructions (Signed)
Drink lots of fluids and get rest Take zpak as directed  Use the cough syrup at night (and mucinex DM during the day  Breathe steam and use nasal saline Update if not starting to improve in a week or if worsening

## 2013-09-11 NOTE — Assessment & Plan Note (Signed)
2-3 weeks of symptoms Cover with zpak given duration and inc in temp  Robitussin with codeine prn cough with caution  Disc symptomatic care - see instructions on AVS  Update if not starting to improve in a week or if worsening

## 2013-09-11 NOTE — Progress Notes (Signed)
Subjective:    Patient ID: Pamela Calhoun, female    DOB: 12-Dec-1985, 27 y.o.   MRN: [id number]  HPI Here with uri symptoms  Had a cold - 2-3 weeks ago -- got a bit better and then much worse again  Bad persistent cough- prod of colored mucous  Fever - aches and chills - 101.7 on sat night  Nasal symptoms did imp No facial pain but is quite congested   Took thera flu otc  (that helped the most )  Alka selzer cold and sinus  Robitussin  None very helpful   No chance she is pregnant   Patient Active Problem List   Diagnosis Date Noted  . Pelvic inflammatory disease (PID) 06/10/2011  . INTERSTITIAL CYSTITIS 06/25/2010  . DEPRESSION 03/22/2007  . ALLERGIC RHINITIS 03/22/2007  . SKIN CANCER, HX OF 03/22/2007   Past Medical History  Diagnosis Date  . Allergy   . Cancer     skin- hystory of melanoma   . Depression   . Ureteral reflux     as a child   . PID (acute pelvic inflammatory disease)   . Endometriosis   . Interstitial cystitis   . Frequent UTI   . History of shingles   . Pap smear, abnormal     cryosx   . Fracture     right foot   Past Surgical History  Procedure Laterality Date  . Tonsillectomy    . Melanoma excision      Right leg   . Laparoscopy      pelvic    History  Substance Use Topics  . Smoking status: Former Games developer  . Smokeless tobacco: Never Used     Comment: quit 2 years ago  . Alcohol Use: Yes     Comment: occassionally   Family History  Problem Relation Age of Onset  . Cancer Maternal Grandmother     breast   Allergies  Allergen Reactions  . Cetirizine Hcl     REACTION: diarrhea  . Paroxetine     REACTION: nausea  . Vicodin [Hydrocodone-Acetaminophen] Itching   Current Outpatient Prescriptions on File Prior to Visit  Medication Sig Dispense Refill  . albuterol (PROVENTIL HFA;VENTOLIN HFA) 108 (90 BASE) MCG/ACT inhaler Inhale 2 puffs into the lungs every 4 (four) hours as needed for wheezing.  1 Inhaler  0  . fexofenadine  (ALLEGRA) 180 MG tablet Take 180 mg by mouth daily.      . hydrOXYzine (VISTARIL) 25 MG capsule Take 25 mg by mouth daily.      Clelia Schaumann Estrad 91-Day 0.15-0.03 &0.01 MG TABS Take by mouth. As directed        . pentosan polysulfate (ELMIRON) 100 MG capsule Take 100 mg by mouth. Take 2 by mouth at bedtime        No current facility-administered medications on file prior to visit.    Review of Systems    Review of Systems  Constitutional: Negative for fever, appetite change, and unexpected weight change.  Eyes: Negative for pain and visual disturbance.  ENt pos for cong/rhinorrhea/ st  Respiratory: Negative for shortness of breath.  pos for prod cough without wheeze  Cardiovascular: Negative for cp or palpitations    Gastrointestinal: Negative for nausea, diarrhea and constipation.  Genitourinary: Negative for urgency and frequency.  Skin: Negative for pallor or rash   Neurological: Negative for weakness, light-headedness, numbness and headaches.  Hematological: Negative for adenopathy. Does not bruise/bleed easily.  Psychiatric/Behavioral: Negative for dysphoric  mood. The patient is not nervous/anxious.      Objective:   Physical Exam  Constitutional: She appears well-developed and well-nourished. No distress.  Fatigued appearing   HENT:  Head: Normocephalic and atraumatic.  Right Ear: External ear normal.  Left Ear: External ear normal.  Mouth/Throat: Oropharynx is clear and moist. No oropharyngeal exudate.  Nares are injected and congested  Mild maxillary sinus tenderness   Eyes: Conjunctivae and EOM are normal. Pupils are equal, round, and reactive to light. Right eye exhibits no discharge. Left eye exhibits no discharge.  Neck: Normal range of motion. Neck supple.  Cardiovascular: Regular rhythm and normal heart sounds.  Exam reveals no gallop.   Rate in 90s  Pulmonary/Chest: Effort normal and breath sounds normal. No respiratory distress. She has no wheezes. She  has no rales.  Harsh bs Scattered rhonchi   Abdominal: There is no tenderness.  Lymphadenopathy:    She has no cervical adenopathy.  Neurological: She is alert. She has normal reflexes. No cranial nerve deficit.  Skin: Skin is warm and dry. No rash noted. No erythema.  Psychiatric: She has a normal mood and affect.          Assessment & Plan:

## 2013-10-11 ENCOUNTER — Ambulatory Visit (INDEPENDENT_AMBULATORY_CARE_PROVIDER_SITE_OTHER): Payer: BC Managed Care – PPO | Admitting: Family Medicine

## 2013-10-11 ENCOUNTER — Encounter: Payer: Self-pay | Admitting: Family Medicine

## 2013-10-11 VITALS — BP 100/60 | HR 102 | Temp 97.9°F | Ht 70.0 in | Wt 160.0 lb

## 2013-10-11 DIAGNOSIS — Z23 Encounter for immunization: Secondary | ICD-10-CM

## 2013-10-11 DIAGNOSIS — Z021 Encounter for pre-employment examination: Secondary | ICD-10-CM

## 2013-10-11 DIAGNOSIS — J309 Allergic rhinitis, unspecified: Secondary | ICD-10-CM

## 2013-10-11 DIAGNOSIS — Z0289 Encounter for other administrative examinations: Secondary | ICD-10-CM

## 2013-10-11 MED ORDER — FLUTICASONE PROPIONATE 50 MCG/ACT NA SUSP: 2.0000 | Freq: Every day | NASAL | Status: DC

## 2013-10-11 NOTE — Patient Instructions (Addendum)
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

## 2013-10-11 NOTE — Progress Notes (Signed)
Subjective:    Patient ID: Pamela Calhoun, female    DOB: January 06, 1986, 27 y.o.   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 When her last one was   Not a smoker  Breathing has been ok  Some congestion from allergies - on allergra  Now and thinks she will change to a different antihistamine otc   Will come back for PPD on Friday am to read on Monday  No exp to TB   Patient Active Problem List   Diagnosis Date Noted  . INTERSTITIAL CYSTITIS 06/25/2010  . ALLERGIC RHINITIS 03/22/2007  . SKIN CANCER, HX OF 03/22/2007   Past Medical History  Diagnosis Date  . Allergy   . Cancer     skin- hystory of melanoma   . Depression   . Ureteral reflux     as a child   . PID (acute pelvic inflammatory disease)   . Endometriosis   . Interstitial cystitis   . Frequent UTI   . History of shingles   . Pap smear, abnormal     cryosx   . Fracture     right foot   Past Surgical History  Procedure Laterality Date  . Tonsillectomy    . Melanoma excision      Right leg   . Laparoscopy      pelvic    History  Substance Use Topics  . Smoking status: Former Games developer  . Smokeless tobacco: Never Used     Comment: quit 2 years ago  . Alcohol Use: Yes     Comment: occassionally   Family History  Problem Relation Age of Onset  . Cancer Maternal Grandmother     breast   Allergies  Allergen Reactions  . Cetirizine Hcl     REACTION: diarrhea  . Paroxetine     REACTION: nausea  . Vicodin [Hydrocodone-Acetaminophen] Itching   Current Outpatient Prescriptions on File Prior to Visit  Medication Sig Dispense Refill  . albuterol (PROVENTIL HFA;VENTOLIN HFA) 108 (90 BASE) MCG/ACT inhaler Inhale 2 puffs into the lungs every 4 (four) hours as needed  for wheezing.  1 Inhaler  0  . fexofenadine (ALLEGRA) 180 MG tablet Take 180 mg by mouth daily.      . hydrOXYzine (VISTARIL) 25 MG capsule Take 25 mg by mouth daily.      Clelia Schaumann Estrad 91-Day 0.15-0.03 &0.01 MG TABS Take by mouth. As directed        . pentosan polysulfate (ELMIRON) 100 MG capsule Take 100 mg by mouth. Take 2 by mouth at bedtime        No current facility-administered medications on file prior to visit.     Review of Systems Review of Systems  Constitutional: Negative for fever, appetite change, fatigue and unexpected weight change.  ENT pos for allergy rhinorrhea and congestion  Eyes: Negative for pain and visual disturbance.  Respiratory: Negative for cough and shortness of breath.  neg for wheezing  Cardiovascular: Negative for cp or palpitations    Gastrointestinal: Negative for nausea, diarrhea and constipation.  Genitourinary: Negative for urgency and frequency.  MSK pos for back pain or joint swelling  Skin: Negative for pallor or rash  Neurological: Negative for weakness, light-headedness, numbness and headaches.  Hematological: Negative for adenopathy. Does not bruise/bleed easily.  Psychiatric/Behavioral: Negative for dysphoric mood. The patient is not nervous/anxious.         Objective:   Physical Exam  Constitutional: She appears well-developed and well-nourished. No distress.  HENT:  Head: Normocephalic and atraumatic.  Right Ear: External ear normal.  Left Ear: External ear normal.  Mouth/Throat: Oropharynx is clear and moist.  Nares are pale and boggy and congested with clear rhinorrhea  No sinus tenderness Throat is clear   Eyes: Conjunctivae and EOM are normal. Pupils are equal, round, and reactive to light. Right eye exhibits no discharge. Left eye exhibits no discharge. No scleral icterus.  Neck: Normal range of motion. Neck supple. No JVD present. No thyromegaly present.  Cardiovascular: Normal rate, regular rhythm, normal heart  sounds and intact distal pulses.  Exam reveals no gallop.   Pulmonary/Chest: Effort normal and breath sounds normal. No respiratory distress. She has no wheezes. She has no rales.  Musculoskeletal: She exhibits no edema.  No acute joint changes   Lymphadenopathy:    She has no cervical adenopathy.  Neurological: She is alert. She has normal reflexes. No cranial nerve deficit. She exhibits normal muscle tone. Coordination normal.  Skin: Skin is warm and dry. No rash noted. No erythema. No pallor.  Psychiatric: She has a normal mood and affect.  cheerful          Assessment & Plan:

## 2013-10-11 NOTE — Progress Notes (Signed)
Pre-visit discussion using our clinic review tool. No additional management support is needed unless otherwise documented below in the visit note.  

## 2013-10-12 NOTE — Assessment & Plan Note (Signed)
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

## 2013-10-12 NOTE — Assessment & Plan Note (Signed)
Worse today after raking leaves Had better control with nasal steroid in the past - flonase px  Also will change otc antihist to zyrtec or claritin and see if there is a change

## 2013-10-13 ENCOUNTER — Encounter: Payer: Self-pay | Admitting: *Deleted

## 2013-10-13 ENCOUNTER — Ambulatory Visit (INDEPENDENT_AMBULATORY_CARE_PROVIDER_SITE_OTHER): Payer: BC Managed Care – PPO | Admitting: *Deleted

## 2013-10-13 DIAGNOSIS — Z111 Encounter for screening for respiratory tuberculosis: Secondary | ICD-10-CM

## 2013-10-16 LAB — TB SKIN TEST: Induration: 0 mm

## 2013-12-07 ENCOUNTER — Telehealth: Payer: Self-pay | Admitting: Family Medicine

## 2013-12-07 NOTE — Telephone Encounter (Signed)
Pt dropped off Staff Medical Report and TB report for Dr Glori Bickers to complete.

## 2013-12-08 NOTE — Telephone Encounter (Signed)
Pt notified forms ready for pick-up 

## 2013-12-08 NOTE — Telephone Encounter (Signed)
Done and in IN box 

## 2013-12-08 NOTE — Telephone Encounter (Signed)
Form in your inbox 

## 2013-12-10 ENCOUNTER — Inpatient Hospital Stay (HOSPITAL_COMMUNITY)
Admission: AD | Admit: 2013-12-10 | Discharge: 2013-12-10 | Disposition: A | Discharge status: Home / Self Care | Payer: BC Managed Care – PPO | Source: Ambulatory Visit | Attending: Obstetrics and Gynecology | Admitting: Obstetrics and Gynecology

## 2013-12-10 ENCOUNTER — Encounter (HOSPITAL_COMMUNITY): Payer: Self-pay

## 2013-12-10 DIAGNOSIS — N751 Abscess of Bartholin's gland: Secondary | ICD-10-CM | POA: Insufficient documentation

## 2013-12-10 DIAGNOSIS — N909 Noninflammatory disorder of vulva and perineum, unspecified: Secondary | ICD-10-CM | POA: Insufficient documentation

## 2013-12-10 LAB — URINALYSIS, ROUTINE W REFLEX MICROSCOPIC
Bilirubin Urine: NEGATIVE
Glucose, UA: NEGATIVE mg/dL
Ketones, ur: NEGATIVE mg/dL
LEUKOCYTES UA: NEGATIVE
Nitrite: NEGATIVE
Protein, ur: NEGATIVE mg/dL
Specific Gravity, Urine: 1.02 (ref 1.005–1.030)
UROBILINOGEN UA: 0.2 mg/dL (ref 0.0–1.0)
pH: 6 (ref 5.0–8.0)

## 2013-12-10 LAB — URINE MICROSCOPIC-ADD ON

## 2013-12-10 LAB — POCT PREGNANCY, URINE: PREG TEST UR: NEGATIVE

## 2013-12-10 MED ORDER — OXYCODONE-ACETAMINOPHEN 5-325 MG PO TABS
1.0000 | ORAL_TABLET | Freq: Once | ORAL | Status: AC
  Administered 2013-12-10: 1 via ORAL
  Filled 2013-12-10: qty 1

## 2013-12-10 MED ORDER — OXYCODONE-ACETAMINOPHEN 5-325 MG PO TABS: 1.0000 | ORAL_TABLET | ORAL | Status: DC | PRN

## 2013-12-10 MED ORDER — AMOXICILLIN-POT CLAVULANATE 875-125 MG PO TABS: 1.0000 | ORAL_TABLET | Freq: Two times a day (BID) | ORAL | Status: DC

## 2013-12-10 NOTE — MAU Provider Note (Signed)
History     CSN: [id number]  Arrival date and time: 12/10/13 1546   First Provider Initiated Contact with Patient 12/10/13 1647      No chief complaint on file.  HPI Ms. Pamela Calhoun is a 28 y.o. [id number] who presents to MAU today with complaint of swelling on the right side of her vulva. The patient states that she first noted a small firm nodule that has continued to get bigger and more painful since Friday. She denies fever, vaginal discharge or drainage from the swelling.   OB History   Grav Para Term Preterm Abortions TAB SAB Ect Mult Living   2 0   2 1 1          Past Medical History  Diagnosis Date  . Allergy   . Cancer     skin- hystory of melanoma   . Depression   . Ureteral reflux     as a child   . PID (acute pelvic inflammatory disease)   . Endometriosis   . Interstitial cystitis   . Frequent UTI   . History of shingles   . Pap smear, abnormal     cryosx   . Fracture     right foot    Past Surgical History  Procedure Laterality Date  . Tonsillectomy    . Melanoma excision      Right leg   . Laparoscopy      pelvic     Family History  Problem Relation Age of Onset  . Cancer Maternal Grandmother     breast    History  Substance Use Topics  . Smoking status: Former Research scientist (life sciences)  . Smokeless tobacco: Never Used     Comment: quit 2 years ago  . Alcohol Use: Yes     Comment: occassionally    Allergies:  Allergies  Allergen Reactions  . Cetirizine Hcl     REACTION: diarrhea  . Paroxetine     REACTION: nausea  . Vicodin [Hydrocodone-Acetaminophen] Itching    Prescriptions prior to admission  Medication Sig Dispense Refill  . acetaminophen (TYLENOL) 500 MG tablet Take 500 mg by mouth every 6 (six) hours as needed (pain).      . fexofenadine (ALLEGRA) 180 MG tablet Take 180 mg by mouth daily.      . hydrOXYzine (VISTARIL) 25 MG capsule Take 25 mg by mouth daily.      [name] 0.15-0.03 &0.01 MG TABS Take 1 tablet by  mouth daily. As directed       . pentosan polysulfate (ELMIRON) 100 MG capsule Take 100 mg by mouth. Take 2 by mouth at bedtime       . albuterol (PROVENTIL HFA;VENTOLIN HFA) 108 (90 BASE) MCG/ACT inhaler Inhale 2 puffs into the lungs every 4 (four) hours as needed for wheezing.  1 Inhaler  0    Review of Systems  Constitutional: Positive for malaise/fatigue. Negative for fever.  Gastrointestinal: Positive for nausea. Negative for vomiting and abdominal pain.  Genitourinary:       Neg - vaginal discharge, bleeding   Physical Exam   Blood pressure 136/70, pulse 93, temperature 98.6 F (37 C), resp. rate 16, height 5\' 10"  (1.778 m), weight 158 lb (71.668 kg).  Physical Exam  Constitutional: She is oriented to person, place, and time. She appears well-developed and well-nourished. No distress.  HENT:  Head: Normocephalic and atraumatic.  Cardiovascular: Normal rate.   Respiratory: Effort normal.  Genitourinary:    There is tenderness  on the right labia.  Neurological: She is alert and oriented to person, place, and time.  Skin: Skin is warm and dry. No erythema.  Psychiatric: She has a normal mood and affect.   Results for orders placed during the hospital encounter of 12/10/13 (from the past 24 hour(s))  URINALYSIS, ROUTINE W REFLEX MICROSCOPIC     Status: Abnormal   Collection Time    12/10/13  4:23 PM      Result Value Range   Color, Urine YELLOW  YELLOW   APPearance HAZY (*) CLEAR   Specific Gravity, Urine 1.020  1.005 - 1.030   pH 6.0  5.0 - 8.0   Glucose, UA NEGATIVE  NEGATIVE mg/dL   Hgb urine dipstick TRACE (*) NEGATIVE   Bilirubin Urine NEGATIVE  NEGATIVE   Ketones, ur NEGATIVE  NEGATIVE mg/dL   Protein, ur NEGATIVE  NEGATIVE mg/dL   Urobilinogen, UA 0.2  0.0 - 1.0 mg/dL   Nitrite NEGATIVE  NEGATIVE   Leukocytes, UA NEGATIVE  NEGATIVE  URINE MICROSCOPIC-ADD ON     Status: Abnormal   Collection Time    12/10/13  4:23 PM      Result Value Range   Squamous  Epithelial / LPF MANY (*) RARE   WBC, UA 0-2  <3 WBC/hpf   RBC / HPF 0-2  <3 RBC/hpf   Bacteria, UA MANY (*) RARE   Urine-Other MUCOUS PRESENT    POCT PREGNANCY, URINE     Status: None   Collection Time    12/10/13  4:27 PM      Result Value Range   Preg Test, Ur NEGATIVE  NEGATIVE    MAU Course  Procedures None  MDM Discussed with Dr. Radene Knee. Rx for Augmentin and Percocet. Follow-up in the office on Tuesday.  Patient denies allergies to Percocet Assessment and Plan  A: Bartholin's Gland Abscess  P: Discharge home Rx for Augment and Percocet given/sent to patient's pharmacy Patient advised to call the office to follow-up on Monday or Tuesday Patient advised to perform sitz baths and use warm compresses to the area multiple times per day Return to MAU or call the office if fever develops Patient may return to MAU as needed or if her condition were to change or worsen  Farris Has, PA-C  12/10/2013, 5:06 PM

## 2013-12-10 NOTE — Discharge Instructions (Signed)
Bartholin's Cyst or Abscess Bartholin's glands are small glands located within the folds of skin (labia) along the sides of the lower opening of the vagina (birth canal). A cyst may develop when the duct of the gland becomes blocked. When this happens, fluid that accumulates within the cyst can become infected. This is known as an abscess. The Bartholin gland produces a mucous fluid to lubricate the outside of the vagina during sexual intercourse. SYMPTOMS   Patients with a small cyst may not have any symptoms.  Mild discomfort to severe pain depending on the size of the cyst and if it is infected (abscess).  Pain, redness, and swelling around the lower opening of the vagina.  Painful intercourse.  Pressure in the perineal area.  Swelling of the lips of the vagina (labia).  The cyst or abscess can be on one side or both sides of the vagina. DIAGNOSIS   A large swelling is seen in the lower vagina area by your caregiver.  Painful to touch.  Redness and pain, if it is an abscess. TREATMENT   Sometimes the cyst will go away on its own.  Apply warm wet compresses to the area or take hot sitz baths several times a day.  An incision to drain the cyst or abscess with local anesthesia.  Culture the pus, if it is an abscess.  Antibiotic treatment, if it is an abscess.  Cut open the gland and suture the edges to make the opening of the gland bigger (marsupialization).  Remove the whole gland if the cyst or abscess returns. PREVENTION   Practice good hygiene.  Clean the vaginal area with a mild soap and soft cloth when bathing.  Do not rub hard in the vaginal area when bathing.  Protect the crotch area with a padded cushion if you take long bike rides or ride horses.  Be sure you are well lubricated when you have sexual intercourse. HOME CARE INSTRUCTIONS   If your cyst or abscess was opened, a small piece of gauze, or a drain, may have been placed in the wound to allow  drainage. Do not remove this gauze or drain unless directed by your caregiver.  Wear feminine pads, not tampons, as needed for any drainage or bleeding.  If antibiotics were prescribed, take them exactly as directed. Finish the entire course.  Only take over-the-counter or prescription medicines for pain, discomfort, or fever as directed by your caregiver. SEEK IMMEDIATE MEDICAL CARE IF:   You have an increase in pain, redness, swelling, or drainage.  You have bleeding from the wound which results in the use of more than the number of pads suggested by your caregiver in 24 hours.  You have chills.  You have a fever.  You develop any new problems (symptoms) or aggravation of your existing condition. MAKE SURE YOU:   Understand these instructions.  Will watch your condition.  Will get help right away if you are not doing well or get worse. Document Released: 11/09/2005 Document Revised: 02/01/2012 Document Reviewed: 06/27/2008 [facility] Patient Information 2014 [place]. Sitz Bath A sitz bath is a warm water bath taken in the sitting position that covers only the hips and buttocks. It may be used for either healing or hygiene purposes. Sitz baths are also used to relieve pain, itching, or muscle spasms. The water may contain medicine. Moist heat will help you heal and relax.  HOME CARE INSTRUCTIONS  Take 3 to 4 sitz baths a day. 1. Fill the bathtub half  full with warm water. 2. Sit in the water and open the drain a little. 3. Turn on the warm water to keep the tub half full. Keep the water running constantly. 4. Soak in the water for 15 to 20 minutes. 5. After the sitz bath, pat the affected area dry first. SEEK MEDICAL CARE IF:  You get worse instead of better. Stop the sitz baths if you get worse. MAKE SURE YOU:  Understand these instructions.  Will watch your condition.  Will get help right away if you are not doing well or get worse. Document Released: 08/01/2004  Document Revised: 08/03/2012 Document Reviewed: 02/06/2011 [facility] Patient Information 2014 [place], [place].

## 2013-12-10 NOTE — MAU Note (Signed)
Pt presents with complaints of pain with a hard knot on the lip of her vaginal area that she noticed on Friday and has gotten larger throughout the weekend.

## 2014-01-29 ENCOUNTER — Ambulatory Visit (INDEPENDENT_AMBULATORY_CARE_PROVIDER_SITE_OTHER): Payer: BC Managed Care – PPO | Admitting: Internal Medicine

## 2014-01-29 ENCOUNTER — Encounter: Payer: Self-pay | Admitting: Internal Medicine

## 2014-01-29 VITALS — BP 118/78 | HR 80 | Temp 97.8°F | Wt 158.5 lb

## 2014-01-29 DIAGNOSIS — J019 Acute sinusitis, unspecified: Secondary | ICD-10-CM

## 2014-01-29 DIAGNOSIS — J020 Streptococcal pharyngitis: Secondary | ICD-10-CM

## 2014-01-29 LAB — POCT RAPID STREP A (OFFICE): Rapid Strep A Screen: NEGATIVE

## 2014-01-29 MED ORDER — AMOXICILLIN-POT CLAVULANATE 875-125 MG PO TABS: 1.0000 | ORAL_TABLET | Freq: Two times a day (BID) | ORAL | Status: DC

## 2014-01-29 NOTE — Patient Instructions (Addendum)

## 2014-01-29 NOTE — Addendum Note (Signed)
Addended by: Lurlean Nanny on: 01/29/2014 04:31 PM   Modules accepted: Orders

## 2014-01-29 NOTE — Progress Notes (Signed)
HPI  Pt presents to the clinic today with c/o fever, ear pain and sore throat. She reports this started about 4 days ago. She also has fever, chills and body aches. She has taken OTC theraflu, Ibuprofen. She has also taken some of her mothers antibiotic. She does have a history of allergies. She is on allegra daily. She has had sick contacts.  Review of Systems    Past Medical History  Diagnosis Date  . Allergy   . Cancer     skin- hystory of melanoma   . Depression   . Ureteral reflux     as a child   . PID (acute pelvic inflammatory disease)   . Endometriosis   . Interstitial cystitis   . Frequent UTI   . History of shingles   . Pap smear, abnormal     cryosx   . Fracture     right foot    Family History  Problem Relation Age of Onset  . Cancer Maternal Grandmother     breast    History   Social History  . Marital Status: Single    Spouse Name: N/A    Number of Children: N/A  . Years of Education: N/A   Occupational History  . Not on file.   Social History Main Topics  . Smoking status: Former Research scientist (life sciences)  . Smokeless tobacco: Never Used     Comment: quit 2 years ago  . Alcohol Use: Yes     Comment: occassionally  . Drug Use: No  . Sexual Activity: Not on file   Other Topics Concern  . Not on file   Social History Narrative  . No narrative on file    Allergies  Allergen Reactions  . Cetirizine Hcl     REACTION: diarrhea  . Paroxetine     REACTION: nausea  . Vicodin [Hydrocodone-Acetaminophen] Itching     Constitutional: Positive fatigue and fever. Denies headache, abrupt weight changes.  HEENT:  Positive sore throat. Denies eye redness, ear pain, ringing in the ears, wax buildup, runny nose or bloody nose. Respiratory: Positive cough. Denies difficulty breathing or shortness of breath.  Cardiovascular: Denies chest pain, chest tightness, palpitations or swelling in the hands or feet.   No other specific complaints in a complete review of systems  (except as listed in HPI above).  Objective:    General: Appears her stated age, well developed, well nourished in NAD. HEENT: Head: normal shape and size, maxillary sinus tenderness noted; Eyes: sclera white, no icterus, conjunctiva pink, PERRLA and EOMs intact; Ears: Tm's gray and intact, normal light reflex; Nose: mucosa pink and moist, septum midline; Throat/Mouth: + PND. Teeth present, mucosa erythematous and moist, no exudate noted, no lesions or ulcerations noted.  Neck: Neck supple, trachea midline. No massses, lumps or thyromegaly present.  Cardiovascular: Normal rate and rhythm. S1,S2 noted.  No murmur, rubs or gallops noted. No JVD or BLE edema. No carotid bruits noted. Pulmonary/Chest: Normal effort and positive vesicular breath sounds. No respiratory distress. No wheezes, rales or ronchi noted.      Assessment & Plan:   Sore Throat secondary to acute sinusitis:  RST- negative Can use a Neti Pot which can be purchased from your local drug store. Flonase 2 sprays each nostril for 3 days and then as needed. Augmentin BID for 10 days  RTC as needed or if symptoms persist.

## 2014-01-29 NOTE — Progress Notes (Signed)
Pre visit review using our clinic review tool, if applicable. No additional management support is needed unless otherwise documented below in the visit note. 

## 2014-01-31 ENCOUNTER — Telehealth: Payer: Self-pay | Admitting: Family Medicine

## 2014-01-31 NOTE — Telephone Encounter (Signed)
Ok to extend work note 

## 2014-01-31 NOTE — Telephone Encounter (Signed)
Please advise if it is okay to extend

## 2014-01-31 NOTE — Telephone Encounter (Signed)
Pt is aware and note will be place in front office for pt to pick up

## 2014-01-31 NOTE — Telephone Encounter (Signed)
Pt says she is still running a fever on and off and is still feeling bad so would like for her work note to be extended.  She would like to try to return to work tomorrow. Thank you.

## 2014-03-09 ENCOUNTER — Ambulatory Visit: Payer: BC Managed Care – PPO | Admitting: Family Medicine

## 2014-03-26 ENCOUNTER — Inpatient Hospital Stay (HOSPITAL_COMMUNITY)
Admission: AD | Admit: 2014-03-26 | Discharge: 2014-03-28 | DRG: 759 | Disposition: A | Discharge status: Home / Self Care | Payer: BC Managed Care – PPO | Source: Ambulatory Visit | Attending: Obstetrics and Gynecology | Admitting: Obstetrics and Gynecology

## 2014-03-26 ENCOUNTER — Encounter (HOSPITAL_COMMUNITY): Payer: Self-pay | Admitting: *Deleted

## 2014-03-26 DIAGNOSIS — F329 Major depressive disorder, single episode, unspecified: Secondary | ICD-10-CM | POA: Diagnosis present

## 2014-03-26 DIAGNOSIS — Z87891 Personal history of nicotine dependence: Secondary | ICD-10-CM

## 2014-03-26 DIAGNOSIS — N764 Abscess of vulva: Principal | ICD-10-CM | POA: Diagnosis present

## 2014-03-26 DIAGNOSIS — F3289 Other specified depressive episodes: Secondary | ICD-10-CM | POA: Diagnosis present

## 2014-03-26 LAB — CBC WITH DIFFERENTIAL/PLATELET
BASOS PCT: 0 % (ref 0–1)
Basophils Absolute: 0 10*3/uL (ref 0.0–0.1)
EOS ABS: 0.7 10*3/uL (ref 0.0–0.7)
Eosinophils Relative: 5 % (ref 0–5)
HCT: 38 % (ref 36.0–46.0)
Hemoglobin: 13 g/dL (ref 12.0–15.0)
Lymphocytes Relative: 23 % (ref 12–46)
Lymphs Abs: 3.2 10*3/uL (ref 0.7–4.0)
MCH: 30.6 pg (ref 26.0–34.0)
MCHC: 34.2 g/dL (ref 30.0–36.0)
MCV: 89.4 fL (ref 78.0–100.0)
Monocytes Absolute: 0.8 10*3/uL (ref 0.1–1.0)
Monocytes Relative: 6 % (ref 3–12)
NEUTROS PCT: 66 % (ref 43–77)
Neutro Abs: 9.1 10*3/uL — ABNORMAL HIGH (ref 1.7–7.7)
Platelets: 309 10*3/uL (ref 150–400)
RBC: 4.25 MIL/uL (ref 3.87–5.11)
RDW: 13.4 % (ref 11.5–15.5)
WBC: 13.8 10*3/uL — ABNORMAL HIGH (ref 4.0–10.5)

## 2014-03-26 LAB — CREATININE, SERUM
Creatinine, Ser: 0.82 mg/dL (ref 0.50–1.10)
GFR calc Af Amer: >90 mL/min (ref >90)
GFR calc non Af Amer: >90 mL/min (ref >90)

## 2014-03-26 MED ORDER — ZOLPIDEM TARTRATE 5 MG PO TABS: 5.0000 mg | ORAL_TABLET | Freq: Every evening | ORAL | Status: DC | PRN

## 2014-03-26 MED ORDER — CLINDAMYCIN PHOSPHATE 900 MG/50ML IV SOLN: 900.0000 mg | Freq: Three times a day (TID) | INTRAVENOUS | Status: DC

## 2014-03-26 MED ORDER — DEXTROSE IN LACTATED RINGERS 5 % IV SOLN
INTRAVENOUS | Status: DC
  Administered 2014-03-26 – 2014-03-28 (×5): via INTRAVENOUS

## 2014-03-26 MED ORDER — KETOROLAC TROMETHAMINE 30 MG/ML IJ SOLN
30.0000 mg | Freq: Four times a day (QID) | INTRAMUSCULAR | Status: DC
  Administered 2014-03-26 – 2014-03-28 (×7): 30 mg via INTRAVENOUS
  Filled 2014-03-26 (×7): qty 1

## 2014-03-26 MED ORDER — GENTAMICIN SULFATE 40 MG/ML IJ SOLN
Freq: Three times a day (TID) | INTRAVENOUS | Status: DC
  Administered 2014-03-26 – 2014-03-27 (×3): via INTRAVENOUS
  Filled 2014-03-26 (×5): qty 4

## 2014-03-26 MED ORDER — SODIUM CHLORIDE 0.9 % IV SOLN
2.0000 g | Freq: Four times a day (QID) | INTRAVENOUS | Status: DC
  Administered 2014-03-26 – 2014-03-27 (×4): 2 g via INTRAVENOUS
  Filled 2014-03-26 (×6): qty 2000

## 2014-03-26 MED ORDER — OXYCODONE-ACETAMINOPHEN 5-325 MG PO TABS
2.0000 | ORAL_TABLET | ORAL | Status: DC | PRN
  Administered 2014-03-26 – 2014-03-28 (×5): 2 via ORAL
  Filled 2014-03-26 (×5): qty 2

## 2014-03-26 NOTE — Progress Notes (Addendum)
ANTIBIOTIC CONSULT NOTE - INITIAL  Pharmacy Consult for Gentamicin Indication: Vulvar abscess  Allergies  Allergen Reactions  . Cetirizine Hcl     REACTION: diarrhea  . Paroxetine     REACTION: nausea  . Vicodin [Hydrocodone-Acetaminophen] Itching    Patient Measurements: Height: 5\' 10"  (177.8 cm) Weight: 161 lb 4 oz (73.143 kg) IBW/kg (Calculated) : 68.5   Vital Signs: Temp: 97.6 F (36.4 C) (05/04 1755) Temp src: Oral (05/04 1755) BP: 145/50 mmHg (05/04 1755) Pulse Rate: 109 (05/04 1755)     Labs: Scr ordered. Estimated SCr=0.65 with CrCl > 118ml/min. Will evaluate after lab reported.  Microbiology: No results found for this or any previous visit (from the past 720 hour(s)).  Medical History: Past Medical History  Diagnosis Date  . Allergy   . Cancer     skin- hystory of melanoma   . Depression   . Ureteral reflux     as a child   . PID (acute pelvic inflammatory disease)   . Endometriosis   . Interstitial cystitis   . Frequent UTI   . History of shingles   . Pap smear, abnormal     cryosx   . Fracture     right foot    Medications:  Ampicillin 2 gram IV q6h, Clindamycin 900mg  IV q8h. Assessment: 28 yo F admitted for treatment of vulvar abscess.  Goal of Therapy:  Gentamicin peaks 6-61mcg/ml and trough < 87mcg/ml  Plan:  1. SCr ordered and to be drawn with CBC. 2. Gentamicin 160mg  IV q8h. 3. Will continue to follow and draw Gentamicin levels at steady state or as determined by pt's clinical status and duration. Thanks!  Vernie Ammons 03/26/2014,6:46 PM  Screatinine=0.82. Continue current dosing regimen.

## 2014-03-27 DIAGNOSIS — N764 Abscess of vulva: Principal | ICD-10-CM

## 2014-03-27 MED ORDER — PROMETHAZINE HCL 25 MG/ML IJ SOLN
12.5000 mg | Freq: Once | INTRAMUSCULAR | Status: AC
  Administered 2014-03-27: 12.5 mg via INTRAVENOUS
  Filled 2014-03-27: qty 1

## 2014-03-27 MED ORDER — DOXYCYCLINE HYCLATE 100 MG PO TABS
100.0000 mg | ORAL_TABLET | Freq: Two times a day (BID) | ORAL | Status: DC
  Administered 2014-03-27 – 2014-03-28 (×3): 100 mg via ORAL
  Filled 2014-03-27 (×3): qty 1

## 2014-03-27 NOTE — Progress Notes (Signed)
Ur chart review completed.  

## 2014-03-27 NOTE — Progress Notes (Signed)
Performed sitz bath this evening and have been applying hot rags intermittently. Pt called out stating abcess burst at 0330. Draining blood and pus, no odor. MD called to be notified

## 2014-03-27 NOTE — H&P (Signed)
Pamela Calhoun is an 28 y.o. female admitted last night with vulvar abscess.  This otherwise healthy female developed a boil in the vulvar region approximately 1 month ago. It was on the left side. No culture was done because it was not draining originally. The boil was also associated with surrounding cellulitis.  The boil was slow to resolve and only resolved after Rocephin x 2, Amoxicillin and Clindamycin.  Approximately 1 week after that boil resolved and after discontinuing those antibiotics another separate boil developed in a separate location.  It started to the left of the rectum.  She came into my office yesterday with severe pain and swelling and warmth. On exam, a large flucuant abscess in the 3 o clock position to the rectum was noted. She was admitted for IV Amp/Gent/Clinda. At 4 am this am, the abscess started draining with the sitz baths and she is feeling better this am.   Pertinent Gynecological History: Menses: flow is light Bleeding: none Contraception: OCP (estrogen/progesterone) DES exposure: denies Blood transfusions: none Sexually transmitted diseases: no past history Previous GYN Procedures: none  Last mammogram not done Date: not applicable Last pap: normal Date: 2014 OB History: G0, P0   Menstrual History: Menarche age: denies  No LMP recorded. Patient is not currently having periods (Reason: Oral contraceptives).    Past Medical History  Diagnosis Date  . Allergy   . Cancer     skin- hystory of melanoma   . Depression   . Ureteral reflux     as a child   . PID (acute pelvic inflammatory disease)   . Endometriosis   . Interstitial cystitis   . Frequent UTI   . History of shingles   . Pap smear, abnormal     cryosx   . Fracture     right foot    Past Surgical History  Procedure Laterality Date  . Tonsillectomy    . Melanoma excision      Right leg   . Laparoscopy      pelvic     Family History  Problem Relation Age of Onset  . Cancer  Maternal Grandmother     breast    Social History:  reports that she has quit smoking. She has never used smokeless tobacco. She reports that she drinks alcohol. She reports that she does not use illicit drugs.  Allergies:  Allergies  Allergen Reactions  . Cetirizine Hcl     REACTION: diarrhea  . Paroxetine     REACTION: nausea  . Vicodin [Hydrocodone-Acetaminophen] Itching    Prescriptions prior to admission  Medication Sig Dispense Refill  . fexofenadine (ALLEGRA) 180 MG tablet Take 180 mg by mouth daily.      . hydrOXYzine (VISTARIL) 25 MG capsule Take 25 mg by mouth daily.      [name] 0.15-0.03 &0.01 MG TABS Take 1 tablet by mouth daily. As directed       . oxyCODONE-acetaminophen (PERCOCET) 10-650 MG per tablet Take 1 tablet by mouth every 6 (six) hours as needed for pain.      . pentosan polysulfate (ELMIRON) 100 MG capsule Take 100 mg by mouth. Take 2 by mouth at bedtime       . acetaminophen (TYLENOL) 500 MG tablet Take 500 mg by mouth every 6 (six) hours as needed (pain).      Marland Kitchen amoxicillin-clavulanate (AUGMENTIN) 875-125 MG per tablet Take 1 tablet by mouth 2 (two) times daily.  20 tablet  0  Review of Systems  All other systems reviewed and are negative.   Blood pressure 129/57, pulse 77, temperature 97.8 F (36.6 C), temperature source Oral, resp. rate 18, height 5\' 10"  (1.778 m), weight 73.143 kg (161 lb 4 oz), SpO2 100.00%. Physical Exam  Nursing note and vitals reviewed. Constitutional: She appears well-developed.  HENT:  Head: Normocephalic.  Neck: Normal range of motion.  Cardiovascular: Normal rate, regular rhythm and normal heart sounds.   Respiratory: Effort normal and breath sounds normal.  GI: Soft.  Pelvic exam of the vulva this am shows a area of drainage to the right of the rectum. The erythema is decreased from yesterday and the warmth is gone.  Swelling is decreased. I can still palpate a small area of fullness but this  is smaller as well.  Results for orders placed during the hospital encounter of 03/26/14 (from the past 24 hour(s))  CBC WITH DIFFERENTIAL     Status: Abnormal   Collection Time    03/26/14  6:55 PM      Result Value Ref Range   WBC 13.8 (*) 4.0 - 10.5 K/uL   RBC 4.25  3.87 - 5.11 MIL/uL   Hemoglobin 13.0  12.0 - 15.0 g/dL   HCT 38.0  36.0 - 46.0 %   MCV 89.4  78.0 - 100.0 fL   MCH 30.6  26.0 - 34.0 pg   MCHC 34.2  30.0 - 36.0 g/dL   RDW 13.4  11.5 - 15.5 %   Platelets 309  150 - 400 K/uL   Neutrophils Relative % 66  43 - 77 %   Neutro Abs 9.1 (*) 1.7 - 7.7 K/uL   Lymphocytes Relative 23  12 - 46 %   Lymphs Abs 3.2  0.7 - 4.0 K/uL   Monocytes Relative 6  3 - 12 %   Monocytes Absolute 0.8  0.1 - 1.0 K/uL   Eosinophils Relative 5  0 - 5 %   Eosinophils Absolute 0.7  0.0 - 0.7 K/uL   Basophils Relative 0  0 - 1 %   Basophils Absolute 0.0  0.0 - 0.1 K/uL  CREATININE, SERUM     Status: None   Collection Time    03/26/14  6:55 PM      Result Value Ref Range   Creatinine, Ser 0.82  0.50 - 1.10 mg/dL   GFR calc non Af Amer >90  >90 mL/min   GFR calc Af Amer >90  >90 mL/min    No results found.  Assessment/Plan: Vulvar Abscess Responding to IV Antibiotics/antiinflammatories and sitz baths. ID consultation today with Dr. Linus Salmons - appreciate the consult  Cyril Mourning 03/27/2014, 8:22 AM

## 2014-03-27 NOTE — Consult Note (Signed)
[facility]     Reason for Consult: abscess    Referring Physician: Dr. Helane Rima  Active Problems:   Abscess, vulva   . ampicillin (OMNIPEN) IV  2 g Intravenous Q6H  . gentamicin (GARAMYCIN) with clindamycin (CLEOCIN) IV   Intravenous Q8H  . ketorolac  30 mg Intravenous 4 times per day    Recommendations: Doxycycline 100 mg bid for 7 days We will arrange follow up in [facility] to counsel on decolonization  Stop shaving pubic area, throw away all razors Check HIV for routine screening per CDC guidelines  Thank you for consult and we will call her to arrange follow up.   Assessment: She has a boil/abscess that has reoccured in a separate place now 4 times.  This typically occurs with MRSA.  Treatment is drainage.     Antibiotics: Ampicillin/gentamicin  HPI: Pamela Calhoun is a 28 y.o. female with history of interstitial cystitis who initially presented with abscess of skin upper pelvic region, then right mons area, another one near vulva and now left buttock. They have all developed pus, drained and resolved after that and multiple antibiotics.  Another "boil" then returned with surrounding cellulitis and is draining on its own.  She was started empirically on amp/gent/clinda.  No fever, no chills.  No systemic signs.    Review of Systems: A comprehensive review of systems was negative.  Past Medical History  Diagnosis Date  . Allergy   . Cancer     skin- hystory of melanoma   . Depression   . Ureteral reflux     as a child   . PID (acute pelvic inflammatory disease)   . Endometriosis   . Interstitial cystitis   . Frequent UTI   . History of shingles   . Pap smear, abnormal     cryosx   . Fracture     right foot    History  Substance Use Topics  . Smoking status: Former Research scientist (life sciences)  . Smokeless tobacco: Never Used     Comment: quit 2 years ago  . Alcohol Use: Yes     Comment: occassionally    Family History  Problem Relation Age of  Onset  . Cancer Maternal Grandmother     breast   Allergies  Allergen Reactions  . Cetirizine Hcl     REACTION: diarrhea  . Paroxetine     REACTION: nausea  . Vicodin [Hydrocodone-Acetaminophen] Itching    OBJECTIVE: Blood pressure 143/73, pulse 92, temperature 97.6 F (36.4 C), temperature source Oral, resp. rate 18, height 5\' 10"  (1.778 m), weight 161 lb 4 oz (73.143 kg), SpO2 100.00%. General: awake, alert, nad Skin: no rashes, + cellulitis Lungs: CTA B Cor: RRR Abdomen: soft, nt, nd   Microbiology: Recent Results (from the past 240 hour(s))  ANAEROBIC CULTURE     Status: None   Collection Time    03/27/14  4:53 AM      Result Value Ref Range Status   Specimen Description ABSCESS   Final   Special Requests NONE   Final   Gram Stain     Final   Value: MODERATE WBC PRESENT,BOTH PMN AND MONONUCLEAR     NO SQUAMOUS EPITHELIAL CELLS SEEN     NO ORGANISMS SEEN     Performed at [facility]   Culture PENDING   Incomplete   Report Status PENDING   Incomplete    Thayer Headings, [place] [facility] [email or web address] [phone] pager  [phone] cell 03/27/2014, 2:35 PM

## 2014-03-27 NOTE — Progress Notes (Signed)
S:  Patient is resting. Sweating a lot. Pain is decreased. Abscess is still draining. Feels a lot better.  O:  Afebrile VSS General alert and oriented Vulva Swelling decreased markedly  IMPRESSION: Vulvar Abscess Responding to antibiotics Continue Antibiotics Appreciate ID Consult ?Later today Possible discharge tomorrow

## 2014-03-28 LAB — CBC WITH DIFFERENTIAL/PLATELET
BASOS PCT: 0 % (ref 0–1)
Basophils Absolute: 0 10*3/uL (ref 0.0–0.1)
EOS ABS: 0.6 10*3/uL (ref 0.0–0.7)
EOS PCT: 7 % — AB (ref 0–5)
HEMATOCRIT: 35.2 % — AB (ref 36.0–46.0)
HEMOGLOBIN: 11.7 g/dL — AB (ref 12.0–15.0)
Lymphocytes Relative: 48 % — ABNORMAL HIGH (ref 12–46)
Lymphs Abs: 4.2 10*3/uL — ABNORMAL HIGH (ref 0.7–4.0)
MCH: 29.8 pg (ref 26.0–34.0)
MCHC: 33.2 g/dL (ref 30.0–36.0)
MCV: 89.6 fL (ref 78.0–100.0)
MONOS PCT: 7 % (ref 3–12)
Monocytes Absolute: 0.7 10*3/uL (ref 0.1–1.0)
Neutro Abs: 3.4 10*3/uL (ref 1.7–7.7)
Neutrophils Relative %: 38 % — ABNORMAL LOW (ref 43–77)
Platelets: 292 10*3/uL (ref 150–400)
RBC: 3.93 MIL/uL (ref 3.87–5.11)
RDW: 13.4 % (ref 11.5–15.5)
WBC: 8.9 10*3/uL (ref 4.0–10.5)

## 2014-03-28 LAB — HIV ANTIBODY (ROUTINE TESTING W REFLEX): HIV 1&2 Ab, 4th Generation: NONREACTIVE

## 2014-03-28 MED ORDER — PROMETHAZINE HCL 12.5 MG PO TABS: 12.5000 mg | ORAL_TABLET | Freq: Four times a day (QID) | ORAL | Status: DC | PRN

## 2014-03-28 MED ORDER — DOXYCYCLINE HYCLATE 100 MG PO TABS: 100.0000 mg | ORAL_TABLET | Freq: Two times a day (BID) | ORAL | Status: DC

## 2014-03-28 MED ORDER — PROMETHAZINE HCL 25 MG PO TABS: 12.5000 mg | ORAL_TABLET | Freq: Four times a day (QID) | ORAL | Status: DC | PRN

## 2014-03-28 MED ORDER — OXYCODONE-ACETAMINOPHEN 5-325 MG PO TABS: 2.0000 | ORAL_TABLET | ORAL | Status: DC | PRN

## 2014-03-28 NOTE — Progress Notes (Signed)
Pt is discharged in the care of Father. Downstairs perambulatory. Denies any any pain or discomfort. Perineal area feels better now.  Denies any further drainage. Or bleeding. Understands all discharge instructions well. Questions were asked and answered. Denies vaginal bleeding.

## 2014-03-28 NOTE — Discharge Summary (Signed)
  Admission Diagnosis: Vulvar Abscess   Discharge Diagnosis: Same  Hospital Course: 28 year old Female admitted with vulvar abscess. She was admitted and placed on triple antibiotics She responded well to the antibiotics and was evaluated by ID. Dr. Linus Salmons placed her on Doxycycline and by HD #3 she had a normal white blood cell count. She was sent home with po Doxycycline and percocet and will follow up with me next week

## 2014-03-28 NOTE — Progress Notes (Signed)
Patient is feeling better Afebrile VSS General alert and oriented Lung CTAB Car RRR Abdomen is soft and non tender Abscess is resolving WBC is normal  IMPRESSION: Vulvar abscess Improving Discharge home

## 2014-03-29 LAB — WOUND CULTURE

## 2014-04-01 LAB — ANAEROBIC CULTURE

## 2014-04-12 ENCOUNTER — Ambulatory Visit (INDEPENDENT_AMBULATORY_CARE_PROVIDER_SITE_OTHER): Payer: BC Managed Care – PPO | Admitting: Internal Medicine

## 2014-04-12 VITALS — BP 121/85 | HR 108 | Temp 98.1°F | Ht 70.0 in | Wt 157.0 lb

## 2014-04-12 DIAGNOSIS — Z22322 Carrier or suspected carrier of Methicillin resistant Staphylococcus aureus: Secondary | ICD-10-CM

## 2014-04-12 MED ORDER — CHLORHEXIDINE GLUCONATE 4 % EX LIQD: Freq: Every day | CUTANEOUS | Status: DC

## 2014-04-12 MED ORDER — MUPIROCIN 2 % EX OINT: 1.0000 "application " | TOPICAL_OINTMENT | Freq: Two times a day (BID) | CUTANEOUS | Status: DC

## 2014-04-12 NOTE — Patient Instructions (Signed)
[facility] Five day Treatment Plan for MRSA (Staph) Decolonization  Please let us know if you have questions or concerns or do not understand the information we give you.  Prepare Chose a period when you will be uninterrupted by going away or other distractions. To ensure that your skin is in good condition, follow the Routine Skin Care principles below to reduce drying and enhance healing. Do not start while you have any active boils. Routine Skin Care principles to reduce drying and enhance healing:  Avoid the use of soap when bathing or showering. DO NOT routinely use antiseptic solutions. If you need to use something, chose a soap substitute (examples -QV Wash or Cetaphil).   When drying with a towel, be gentle and pat dry your skin. Avoid rubbing the skin.   Reduce the overall frequency of bathing or showering. A short shower (3 minutes) is better than a bath in terms of its effect on the skin.   Use a simple sorbelene based-cream on your skin prior to showering and immediately after drying. (Examples: Hydroderm or other Sorbelene-based preparations). Especially protect healing or dry areas of skin in this way. Don't use a barrier cream with a vaseline base or with perfumes and additives. You are more likely to be allergic to these products, and cause further damage to your skin.   Make sure that you clean and cover any skin cuts or grazes that occur. Try to avoid picking or biting fingernails and the skin around the nails Keep your fingernails clipped short and clean to reduce problems caused by scratching.   For itchy skin, try gently massaging sorbelene-based cream into itchy areas instead of scratching it. A long-acting non-sedating antihistamine drug is the next option to try. However make sure that you are not taking medication that will interact with this drug type.                                                                         To prepare yourself  for your treatment, it is recommended that you complete the following steps:  Remove nose, ear and other body piercing items for several days prior to the treatment and keep them out during the treatment period   Purchase a new toothbrush, disposable razor (if used), sterident for dentures (if required) and a container of alcohol hand hygiene solution (gel or rub)   Discard old toothbrushes and razors when the treatment starts. Also discard opened deodorant rollers, skin adhesive tapes, skin creams and solutions- all of these may already be contaminated with staph   Discard pumice stone(s), sponges and disposable face cloths if used    Discard all make-up brushes, creams, and implements   Discard or hot wash all fluffy toys   Wash hair brush and comb, nail files, plastic toys and cutters in the dishwasher or purchase new ones   Remove nail varnish and artificial nails  Daily routine for 5 days     *Minimize contact with members of the community during the 5 days of treatment* Body washes The effectiveness of the program increases if the correct procedure is used:  Apply the provided antiseptic body wash (2% chlorhexidine) in the shower daily   Take   care to wash hair, under the arms and into the groin and into any folds of skin   Allow the antiseptic to remain on the skin for at least 5 minutes  Nasal ointment  Disinfect your hands with alcohol gel/rub and allow to dry    Open the mupirocin 2% (Bactroban) nasal ointment.   Place small amount (size of match head) of ointment onto a clean cotton swab and massage gently around the inside of the nostril on one side, making sure not to insert it too deeply (no more than 2 cm or a little less than an inch).   Use a new cotton bud for the other nostril so that you do not contaminate the Bactroban tube with staph.    After applying the ointment, press a finger against the nose next to the nostril opening and use a circular motion to  spread the ointment within the nose   Apply the mupirocin ointment two times a day for 5 days   Disinfect your hands with alcohol rub/gel after applying the ointmentp>  Personal items (combs, razor, eyeglasses, jewelry, etc.)     Disinfect all personal items daily with an alcohol-based cleanser  House Environment and Clothes/Linens  On day 2 and 5 of the treatment, clean your house, (especially the bedroom and bathroom). Clean dust off all surfaces and then vacuum clean all floor surfaces AND soft furnishings (such as your favorite chair). If your chair/couch has a vinyl or leather covering then wipe over the chair with warm soapy water and then dry with a clean towel (which should then be washed). Staph lives in skin scales from humans that contaminate the environment. This can lead to re-infection.    Disinfect the shower floor and/or bath tub daily    On days 1, 3, AND 5 of the treatment wash your clothes, underwear, pajamas and bed linen (such as towels, sheets, washcloths, and bath mats). A hot wash with laundry detergent is best (there is no need to use expensive laundry detergent or powder). Dry clothes in sun if possible. Change into clean clothes or pajamas on those days after your shower.    Do not share or exchange any personal items of clothing  Sports/Gym     Surfaces, equipment and towels, and skin-to-skin contact are all potential sources for staph re-infection Pets Dogs and other companion animals can also be colonized with the same strains of MRSA. Best to wash or replace bedding material for the animal and wash the animal at least once during the treatment period with antiseptic solution (2% chlorhexidine wash). Ensure that the skin of the animal is kept in good condition. If the pet has any chronic skin disorders, consult your vet prior to starting your treatment process. What about my partner, family, or household members? Usually when an aggressive strain of staph moves in  to a family or household, only certain members of that group get infections (boils). This is despite the fact that the strain has probably transferred itself from person to person within the group. Those without boils may also be carrying the bacterium, however they must have better resistance (immunity) or perhaps have better skin condition. Staph likes to invade through cuts, scratches and skin with dermatitis or dryness.    Follow-up after decolonization treatment  Possible approaches will vary depending on how your treatment goes. Your provider will instruct you on the follow-up best suited for you. Some options are:   Wait and see - if no further   boils occur within 6 months then it is probable that the strain of staph has been eliminated from you.    Continue intermittent body washes 1-2 times per week with 2% aqueous chlorhexidine soap preparation or similar   Future antibiotic use  The best preventative approach to avoiding future problems may be to avoid use of antibiotics unless there is a strong indication. Antibiotics are often prescribed for minor infections or for respiratory infections that are mostly due to viruses. It is in your best interest to ride these infections out rather than taking antibiotics. Taking antibiotics alters your natural bacterial flora on your skin and in your gut. This may reduce your resistance against acquiring a resistant bacterial strain such as MRSA).   Important note: if you do become very ill with possible infection and require hospital review, it is important for you to remind your medical care providers that you have been colonized with MRSA in the past as they may have to use antibiotics that are active against the strain that you had previously.   References Wiese-Posselt et al. Clin Inf Dis 2007:44:e88  CDC:  http://www.cdc.gov/ncidod/dhqp/ar_mrsa_ca.html 

## 2014-04-12 NOTE — Progress Notes (Signed)
   Subjective:    Patient ID: Pamela Calhoun, female    DOB: 10-02-1986, 28 y.o.   MRN: [id number]  HPI Here for follow up of MRSA infection. Pamela Calhoun is a 28 y.o. female with history of interstitial cystitis who initially presented with abscess of skin upper pelvic region, then right mons area, another one near vulva and now left buttock. They have all developed pus, drained and resolved after that and multiple antibiotics. Another "boil" then returned with surrounding cellulitis and is draining on its own. She was admitted earlier this month and treated for another boil. Culture grew out MRSA and she went out on doxycycline.  It has drained and closed.  It seemed to be brought on by shaving.  She has changed all razors and not shaved since.      Review of Systems  Constitutional: Negative for fever and chills.  Skin: Negative for rash and wound.  Hematological: Negative for adenopathy.       Objective:   Physical Exam  Constitutional: She appears well-developed and well-nourished. No distress.  Eyes: No scleral icterus.  Skin: Skin is warm and dry. No rash noted.          Assessment & Plan:

## 2014-04-12 NOTE — Assessment & Plan Note (Addendum)
No problems since.  I discussed decolonization for MRSA and environmental control at her home.  She was given instructions and prescriptions for Hibiclens and bactroban.   25 minutes spent with 15 minutes spent on counseling.   RTC PRN

## 2014-04-26 ENCOUNTER — Ambulatory Visit (INDEPENDENT_AMBULATORY_CARE_PROVIDER_SITE_OTHER): Payer: BC Managed Care – PPO | Admitting: Internal Medicine

## 2014-04-26 ENCOUNTER — Encounter: Payer: Self-pay | Admitting: Internal Medicine

## 2014-04-26 VITALS — BP 108/62 | HR 68 | Temp 98.1°F | Wt 157.0 lb

## 2014-04-26 DIAGNOSIS — J019 Acute sinusitis, unspecified: Secondary | ICD-10-CM

## 2014-04-26 MED ORDER — CEFUROXIME AXETIL 500 MG PO TABS: 500.0000 mg | ORAL_TABLET | Freq: Two times a day (BID) | ORAL | Status: DC

## 2014-04-26 NOTE — Progress Notes (Signed)
HPI  Pt presents to the clinic today with c/o facial pain and pressure, sneezing, sore throat, and cough. This started 4 days ago. The cough is dry and hacking. She is blowing green mucous out of her nose. She has tried Advil cold and sinus and allegra without any relief/ She does have a history of allergies. She is current smoker.  Review of Systems    Past Medical History  Diagnosis Date  . Allergy   . Cancer     skin- hystory of melanoma   . Depression   . Ureteral reflux     as a child   . PID (acute pelvic inflammatory disease)   . Endometriosis   . Interstitial cystitis   . Frequent UTI   . History of shingles   . Pap smear, abnormal     cryosx   . Fracture     right foot    Family History  Problem Relation Age of Onset  . Cancer Maternal Grandmother     breast    History   Social History  . Marital Status: Single    Spouse Name: N/A    Number of Children: N/A  . Years of Education: N/A   Occupational History  . Not on file.   Social History Main Topics  . Smoking status: Former Research scientist (life sciences)  . Smokeless tobacco: Never Used     Comment: quit 2 years ago  . Alcohol Use: Yes     Comment: occassionally  . Drug Use: No  . Sexual Activity: Not on file   Other Topics Concern  . Not on file   Social History Narrative  . No narrative on file    Allergies  Allergen Reactions  . Cetirizine Hcl     REACTION: diarrhea  . Paroxetine     REACTION: nausea  . Vicodin [Hydrocodone-Acetaminophen] Itching     Constitutional: Positive headache, fatigue. Denies fever or abrupt weight changes.  HEENT:  Positive facial pain, nasal congestion and sore throat. Denies eye redness, ear pain, ringing in the ears, wax buildup, runny nose or bloody nose. Respiratory: Positive cough. Denies difficulty breathing or shortness of breath.  Cardiovascular: Denies chest pain, chest tightness, palpitations or swelling in the hands or feet.   No other specific complaints in a  complete review of systems (except as listed in HPI above).  Objective:  BP 108/62  Pulse 68  Temp(Src) 98.1 F (36.7 C) (Oral)  Wt 157 lb (71.215 kg)  SpO2 98%  LMP 02/21/2014   General: Appears her stated age, well developed, well nourished in NAD. HEENT: Head: normal shape and size, frontal and maxillary sinus tenderness noted; Eyes: sclera white, no icterus, conjunctiva pink, PERRLA and EOMs intact; Ears: Tm's gray and intact, normal light reflex, + effusions noted; Nose: mucosa pink and moist, septum midline; Throat/Mouth: + PND. Teeth present, mucosa pink and moist, no exudate noted, no lesions or ulcerations noted.  Neck: Neck supple, trachea midline. No massses, lumps or thyromegaly present.  Cardiovascular: Tachycardic with normal rhythm. S1,S2 noted.  No murmur, rubs or gallops noted. No JVD or BLE edema. No carotid bruits noted. Pulmonary/Chest: Normal effort and positive vesicular breath sounds. No respiratory distress. No wheezes, rales or ronchi noted.      Assessment & Plan:   Acute bacterial sinusitis  Can use a Neti Pot which can be purchased from your local drug store. Continue allegra Ceftin BID for 10 days Work note provided  RTC as needed or if symptoms  persist.

## 2014-04-26 NOTE — Progress Notes (Signed)
Pre visit review using our clinic review tool, if applicable. No additional management support is needed unless otherwise documented below in the visit note. 

## 2014-04-26 NOTE — Patient Instructions (Signed)

## 2014-04-26 NOTE — Addendum Note (Signed)
Addended by: Jearld Fenton on: 04/26/2014 02:50 PM   Modules accepted: Orders

## 2014-04-27 ENCOUNTER — Telehealth: Payer: Self-pay | Admitting: Family Medicine

## 2014-04-27 NOTE — Telephone Encounter (Signed)
Letter printed and left in front office for pt to pick up and pt is aware

## 2014-04-27 NOTE — Telephone Encounter (Signed)
Pt was seen by Webb Silversmith on 04/26/14, she needs additional note for being out of work today 04/27/14.  She was running a low grade fever and still not feeling well.  Best number to call [phone] when ready for pickup / lt

## 2014-04-27 NOTE — Telephone Encounter (Signed)
Mel- can you take care of this? 

## 2014-05-02 ENCOUNTER — Encounter: Payer: Self-pay | Admitting: Family Medicine

## 2014-05-02 ENCOUNTER — Ambulatory Visit (INDEPENDENT_AMBULATORY_CARE_PROVIDER_SITE_OTHER): Payer: BC Managed Care – PPO | Admitting: Family Medicine

## 2014-05-02 VITALS — BP 116/68 | HR 81 | Temp 98.1°F | Ht 70.0 in | Wt 155.8 lb

## 2014-05-02 DIAGNOSIS — A084 Viral intestinal infection, unspecified: Secondary | ICD-10-CM | POA: Insufficient documentation

## 2014-05-02 MED ORDER — PROMETHAZINE HCL 25 MG PO TABS: 25.0000 mg | ORAL_TABLET | Freq: Three times a day (TID) | ORAL | Status: DC | PRN

## 2014-05-02 NOTE — Progress Notes (Signed)
Pre visit review using our clinic review tool, if applicable. No additional management support is needed unless otherwise documented below in the visit note. 

## 2014-05-02 NOTE — Patient Instructions (Signed)
Drink fluids in sips to rehydrate as nausea improved Shot of phenergan for nausea now  I sent px for oral phenergan to the pharmacy - you can take it every 8 hours as needed (do not mix with hydroxyzine however- hold that while on phenergan) When ready-gradually advance to the BRAT diet (banana/rice/applesauce/toast) and advance slowly  If symptoms of dehydration-go to ER for IV fluids  Update if not starting to improve in a week or if worsening  - if severe abdominal pain or high fever go to ER

## 2014-05-02 NOTE — Progress Notes (Signed)
Subjective:    Patient ID: Pamela Calhoun, female    DOB: 05-24-1986, 28 y.o.   MRN: [id number]  HPI Here with n/v/d  Stomach pain last night  She had to care for her nephew with viral gastroenteritis last week   She had sinus infection last week Was in hosp with skin mrsa - was on doxycycline - better now    Whole stomach is hurting  Is grumbling - lots of noise  Vomited all day - and ate bread 1 hour ago - has not vomited it  Diarrhea - liquid/watery - every hour or less-it has slowed down  She took a diarrhea med otc (? Name)- but threw it up  Feels better than she was this am  Fever this am -came down  Has not had much fluid   Patient Active Problem List   Diagnosis Date Noted  . Viral gastroenteritis 05/02/2014  . MRSA (methicillin resistant Staphylococcus aureus) carrier 04/12/2014  . ALLERGIC RHINITIS 03/22/2007  . SKIN CANCER, HX OF 03/22/2007   Past Medical History  Diagnosis Date  . Allergy   . Cancer     skin- hystory of melanoma   . Depression   . Ureteral reflux     as a child   . PID (acute pelvic inflammatory disease)   . Endometriosis   . Interstitial cystitis   . Frequent UTI   . History of shingles   . Pap smear, abnormal     cryosx   . Fracture     right foot   Past Surgical History  Procedure Laterality Date  . Tonsillectomy    . Melanoma excision      Right leg   . Laparoscopy      pelvic    History  Substance Use Topics  . Smoking status: Former Research scientist (life sciences)  . Smokeless tobacco: Never Used     Comment: quit 2 years ago  . Alcohol Use: Yes     Comment: occassionally   Family History  Problem Relation Age of Onset  . Cancer Maternal Grandmother     breast   Allergies  Allergen Reactions  . Cetirizine Hcl     REACTION: diarrhea  . Paroxetine     REACTION: nausea  . Vicodin [Hydrocodone-Acetaminophen] Itching   Current Outpatient Prescriptions on File Prior to Visit  Medication Sig Dispense Refill  . ALPRAZolam (XANAX) 0.5  MG tablet       . cefUROXime (CEFTIN) 500 MG tablet Take 1 tablet (500 mg total) by mouth 2 (two) times daily with a meal.  20 tablet  0  . fexofenadine (ALLEGRA) 180 MG tablet Take 180 mg by mouth daily.      . hydrOXYzine (VISTARIL) 25 MG capsule Take 25 mg by mouth daily.      [name] 0.15-0.03 &0.01 MG TABS Take 1 tablet by mouth daily. As directed       . pentosan polysulfate (ELMIRON) 100 MG capsule Take 100 mg by mouth. Take 2 by mouth at bedtime        No current facility-administered medications on file prior to visit.     Review of Systems Review of Systems  Constitutional: pos for fatigue/malaise and fever that is now resolved ENT neg for sinus pain or st  Eyes: Negative for pain and visual disturbance.  Respiratory: Negative for cough and shortness of breath.   Cardiovascular: Negative for cp or palpitations    Gastrointestinal: Negative for  constipation. pos for  nausea and diarrhea , pos for vomiting this am  Genitourinary: Negative for urgency and frequency.  Skin: Negative for pallor or rash   Neurological: Negative for weakness, light-headedness, numbness and headaches.  Hematological: Negative for adenopathy. Does not bruise/bleed easily.  Psychiatric/Behavioral: Negative for dysphoric mood. The patient is not nervous/anxious.         Objective:   Physical Exam  Constitutional: She appears well-developed and well-nourished. No distress.  Fatigued appearing   HENT:  Head: Normocephalic and atraumatic.  Right Ear: External ear normal.  Left Ear: External ear normal.  Mouth/Throat: Oropharynx is clear and moist.  Eyes: Conjunctivae and EOM are normal. Pupils are equal, round, and reactive to light. No scleral icterus.  Neck: Normal range of motion. Neck supple. No thyromegaly present.  Cardiovascular: Normal rate and regular rhythm.   Pulmonary/Chest: Effort normal and breath sounds normal. No respiratory distress. She has no wheezes. She  has no rales.  Abdominal: Soft. Bowel sounds are normal. She exhibits no distension and no mass. There is tenderness. There is no rebound and no guarding.  Mild tenderness all over abd  slt hyperactive bs-not high pitched   Lymphadenopathy:    She has no cervical adenopathy.  Neurological: She is alert.  Skin: Skin is warm and dry. No rash noted. No pallor.  Psychiatric: She has a normal mood and affect.          Assessment & Plan:   Problem List Items Addressed This Visit     Digestive   Viral gastroenteritis - Primary   Relevant Medications      promethazine (PHENERGAN) injection 50 mg (Completed)

## 2014-05-03 DIAGNOSIS — A088 Other specified intestinal infections: Secondary | ICD-10-CM

## 2014-05-03 MED ORDER — PROMETHAZINE HCL 25 MG/ML IJ SOLN
50.0000 mg | Freq: Once | INTRAMUSCULAR | Status: AC
  Administered 2014-05-03: 50 mg via INTRAMUSCULAR

## 2014-05-03 NOTE — Assessment & Plan Note (Signed)
After caring with nephew with the same Improved from this am and no dehydration Phenergan IM 50 mg now and px for oral 25 mg tid prn (pt will avoid atarax while on this) Sips of fluids-adv to [employer] when ready Update if not starting to improve in a week or if worsening  -esp if worse abd pain or fever  Handout given

## 2014-06-20 ENCOUNTER — Ambulatory Visit (INDEPENDENT_AMBULATORY_CARE_PROVIDER_SITE_OTHER): Payer: BC Managed Care – PPO | Admitting: Family Medicine

## 2014-06-20 ENCOUNTER — Encounter: Payer: Self-pay | Admitting: Family Medicine

## 2014-06-20 VITALS — BP 118/76 | HR 88 | Temp 98.1°F | Ht 70.0 in | Wt 152.2 lb

## 2014-06-20 DIAGNOSIS — R350 Frequency of micturition: Secondary | ICD-10-CM

## 2014-06-20 DIAGNOSIS — N390 Urinary tract infection, site not specified: Secondary | ICD-10-CM | POA: Insufficient documentation

## 2014-06-20 DIAGNOSIS — J069 Acute upper respiratory infection, unspecified: Secondary | ICD-10-CM

## 2014-06-20 DIAGNOSIS — R110 Nausea: Secondary | ICD-10-CM

## 2014-06-20 DIAGNOSIS — B9789 Other viral agents as the cause of diseases classified elsewhere: Secondary | ICD-10-CM

## 2014-06-20 DIAGNOSIS — N3000 Acute cystitis without hematuria: Secondary | ICD-10-CM

## 2014-06-20 LAB — POCT URINALYSIS DIPSTICK
BILIRUBIN UA: NEGATIVE
Glucose, UA: NEGATIVE
Leukocytes, UA: NEGATIVE
NITRITE UA: NEGATIVE
PH UA: 6
PROTEIN UA: NEGATIVE
Spec Grav, UA: 1.02
Urobilinogen, UA: 0.2

## 2014-06-20 MED ORDER — PHENAZOPYRIDINE HCL 100 MG PO TABS: 100.0000 mg | ORAL_TABLET | Freq: Three times a day (TID) | ORAL | Status: DC | PRN

## 2014-06-20 MED ORDER — LEVOFLOXACIN 500 MG PO TABS: 500.0000 mg | ORAL_TABLET | Freq: Every day | ORAL | Status: DC

## 2014-06-20 MED ORDER — PROMETHAZINE HCL 25 MG/ML IJ SOLN
50.0000 mg | Freq: Once | INTRAMUSCULAR | Status: AC
  Administered 2014-06-20: 50 mg via INTRAMUSCULAR

## 2014-06-20 NOTE — Progress Notes (Signed)
Subjective:    Patient ID: Pamela Calhoun, female    DOB: Oct 17, 1986, 28 y.o.   MRN: [id number]  HPI Here with bladder pain and spasms- pretty bad for several days  Burning to urinate  No blood in urine  Some flank pain in L side only   Some nausea / no vomiting  Ate once yesterday  Forgot she has some phenergan   LMP was 2 weeks ago  She had last ovarian cyst a long time ago  On long phase oc and no missed pills   ? If her IC or an infection  Does have f/u with urology soon- but could not get in earlier  Results for orders placed in visit on 06/20/14  POCT URINALYSIS DIPSTICK      Result Value Ref Range   Color, UA yellow     Clarity, UA hazy     Glucose, UA neg.     Bilirubin, UA neg.     Ketones, UA 2+     Spec Grav, UA 1.020     Blood, UA Trace     pH, UA 6.0     Protein, UA neg.     Urobilinogen, UA 0.2     Nitrite, UA neg.     Leukocytes, UA Negative       Had a fever this am 100.3 this am - she took some advil   Some vaginal discharge - thick and white - thinks she has a yeast infection  Is raw  Not active lately sexually at all   Has also had a cough for several weeks Usually non productive Some post nasal drip  Some sinus pressure and pain intermittently   Patient Active Problem List   Diagnosis Date Noted  . UTI (urinary tract infection) 06/20/2014  . Viral gastroenteritis 05/02/2014  . MRSA (methicillin resistant Staphylococcus aureus) carrier 04/12/2014  . ALLERGIC RHINITIS 03/22/2007  . SKIN CANCER, HX OF 03/22/2007   Past Medical History  Diagnosis Date  . Allergy   . Cancer     skin- hystory of melanoma   . Depression   . Ureteral reflux     as a child   . PID (acute pelvic inflammatory disease)   . Endometriosis   . Interstitial cystitis   . Frequent UTI   . History of shingles   . Pap smear, abnormal     cryosx   . Fracture     right foot   Past Surgical History  Procedure Laterality Date  . Tonsillectomy    . Melanoma  excision      Right leg   . Laparoscopy      pelvic    History  Substance Use Topics  . Smoking status: Former Research scientist (life sciences)  . Smokeless tobacco: Never Used     Comment: quit 2 years ago  . Alcohol Use: Yes     Comment: occassionally   Family History  Problem Relation Age of Onset  . Cancer Maternal Grandmother     breast   Allergies  Allergen Reactions  . Cetirizine Hcl     REACTION: diarrhea  . Paroxetine     REACTION: nausea  . Vicodin [Hydrocodone-Acetaminophen] Itching   Current Outpatient Prescriptions on File Prior to Visit  Medication Sig Dispense Refill  . ALPRAZolam (XANAX) 0.5 MG tablet       . fexofenadine (ALLEGRA) 180 MG tablet Take 180 mg by mouth daily.      . hydrOXYzine (VISTARIL) 25 MG capsule  Take 25 mg by mouth daily.      [name] 0.15-0.03 &0.01 MG TABS Take 1 tablet by mouth daily. As directed       . pentosan polysulfate (ELMIRON) 100 MG capsule Take 100 mg by mouth. Take 2 by mouth at bedtime       . promethazine (PHENERGAN) 25 MG tablet Take 1 tablet (25 mg total) by mouth every 8 (eight) hours as needed for nausea or vomiting.  20 tablet  0   No current facility-administered medications on file prior to visit.     Review of Systems Review of Systems  Constitutional: Negative for fever, appetite change, fatigue and unexpected weight change.  Eyes: Negative for pain and visual disturbance.  ENT pos for congestion / sinus pressure/neg for ST Respiratory: Negative for wheeze  and shortness of breath.  pos for cough Cardiovascular: Negative for cp or palpitations    Gastrointestinal: Negative for  diarrhea and constipation. neg for blood in stool or dark stool Genitourinary: pos for urgency and frequency. pos for bladder and flank pain /neg for hematuria  Skin: Negative for pallor or rash   Neurological: Negative for weakness, light-headedness, numbness and headaches.  Hematological: Negative for adenopathy. Does not  bruise/bleed easily.  Psychiatric/Behavioral: Negative for dysphoric mood. The patient is not nervous/anxious.         Objective:   Physical Exam  Constitutional: She appears well-developed and well-nourished. No distress.  Uncomfortable appearing female   HENT:  Head: Normocephalic and atraumatic.  Right Ear: External ear normal.  Left Ear: External ear normal.  Mouth/Throat: Oropharynx is clear and moist.  Nares are mildly congested No sinus tenderness   Eyes: Conjunctivae and EOM are normal. Pupils are equal, round, and reactive to light. No scleral icterus.  Neck: Normal range of motion. Neck supple.  Cardiovascular: Normal rate and regular rhythm.   Pulmonary/Chest: Effort normal and breath sounds normal. No respiratory distress. She has no wheezes. She has no rales. She exhibits no tenderness.  Harsh cough  Abdominal: Soft. Bowel sounds are normal. She exhibits no distension and no mass. There is no hepatosplenomegaly. There is tenderness in the suprapubic area. There is no rigidity, no rebound, no guarding, no CVA tenderness, no tenderness at McBurney's point and negative Murphy's sign.  Lymphadenopathy:    She has no cervical adenopathy.  Skin: Skin is warm and dry. No rash noted. No erythema.  Psychiatric: She has a normal mood and affect.          Assessment & Plan:   Problem List Items Addressed This Visit     Respiratory   Viral URI with cough     In a former smoker - cough lingering for weeks and malaise and fever  Cover with levaquin Reassuring exam Disc sympt care Update if not starting to improve in a week or if worsening        Genitourinary   UTI (urinary tract infection)     uti vs IC flare  Pending urine cx Px levaquin for this and uri with ongoing cough Has f/u with urology soon Disc avoidance of high acid foods/bev-she has had too many tomatoes lately  Recommended dietary aid "prelief" if it is still avail to take with meals  Update if not  starting to improve in a week or if worsening      Relevant Medications      phenazopyridine (PYRIDIUM) tablet   Other Relevant Orders      Urine  culture    Other Visit Diagnoses   Urinary frequency    -  Primary    Relevant Orders       POCT urinalysis dipstick (Completed)    Nausea alone        Relevant Medications       promethazine (PHENERGAN) injection 50 mg (Completed)

## 2014-06-20 NOTE — Progress Notes (Signed)
Pre visit review using our clinic review tool, if applicable. No additional management support is needed unless otherwise documented below in the visit note. 

## 2014-06-20 NOTE — Patient Instructions (Signed)
Drink lots of water  Take the levaquin -this would cover respiratory and urinary infections  We will send urine for culture  There is a supplement you can try for acidic foods and beverages - called prelief - see if your drug store can order it  See your urologist as planned  Phenergan injection now for nausea  Update me if symptoms worsen

## 2014-06-21 DIAGNOSIS — B9789 Other viral agents as the cause of diseases classified elsewhere: Secondary | ICD-10-CM

## 2014-06-21 DIAGNOSIS — J069 Acute upper respiratory infection, unspecified: Secondary | ICD-10-CM | POA: Insufficient documentation

## 2014-06-21 NOTE — Assessment & Plan Note (Signed)
In a former smoker - cough lingering for weeks and malaise and fever  Cover with levaquin Reassuring exam Disc sympt care Update if not starting to improve in a week or if worsening

## 2014-06-21 NOTE — Assessment & Plan Note (Addendum)
uti vs IC flare  Pending urine cx Px levaquin for this and uri with ongoing cough Has f/u with urology soon Phenergan inj today 50 mg and she has oral to use at home for nausea -will alert if she begins vomiting Disc avoidance of high acid foods/bev-she has had too many tomatoes lately  Recommended dietary aid "prelief" if it is still avail to take with meals  Update if not starting to improve in a week or if worsening

## 2014-06-22 ENCOUNTER — Telehealth: Payer: Self-pay | Admitting: Family Medicine

## 2014-06-22 LAB — URINE CULTURE
COLONY COUNT: NO GROWTH
Organism ID, Bacteria: NO GROWTH

## 2014-06-22 NOTE — Telephone Encounter (Signed)
Letter faxed to pt and pt notified

## 2014-06-22 NOTE — Telephone Encounter (Signed)
Left voicemail requesting pt to call office 

## 2014-06-22 NOTE — Telephone Encounter (Signed)
Pt called and says she was not able to return to work yesterday, 06/21/2014 and she needs an excuse for work. She was able to return today.  If possible, pt would like for you to fax the note to her [phone]. Thank you.

## 2014-06-22 NOTE — Telephone Encounter (Signed)
Letter done and in IN box 

## 2014-07-11 ENCOUNTER — Telehealth: Payer: Self-pay | Admitting: Family Medicine

## 2014-07-11 NOTE — Telephone Encounter (Signed)
Forms placed in your in box.

## 2014-07-11 NOTE — Telephone Encounter (Signed)
Done and in IN box 

## 2014-07-11 NOTE — Telephone Encounter (Signed)
Pt dropped off staff medical form to be filled out Put on shapale's desk

## 2014-07-12 ENCOUNTER — Ambulatory Visit (INDEPENDENT_AMBULATORY_CARE_PROVIDER_SITE_OTHER): Payer: BC Managed Care – PPO

## 2014-07-12 ENCOUNTER — Encounter: Payer: Self-pay | Admitting: Podiatry

## 2014-07-12 ENCOUNTER — Ambulatory Visit (INDEPENDENT_AMBULATORY_CARE_PROVIDER_SITE_OTHER): Payer: BC Managed Care – PPO | Admitting: Podiatry

## 2014-07-12 VITALS — BP 124/100 | HR 75 | Resp 16 | Ht 70.0 in | Wt 151.0 lb

## 2014-07-12 DIAGNOSIS — M7660 Achilles tendinitis, unspecified leg: Secondary | ICD-10-CM

## 2014-07-12 DIAGNOSIS — M79609 Pain in unspecified limb: Secondary | ICD-10-CM

## 2014-07-12 DIAGNOSIS — M7662 Achilles tendinitis, left leg: Secondary | ICD-10-CM

## 2014-07-12 DIAGNOSIS — M79605 Pain in left leg: Secondary | ICD-10-CM

## 2014-07-12 DIAGNOSIS — M722 Plantar fascial fibromatosis: Secondary | ICD-10-CM

## 2014-07-12 MED ORDER — DICLOFENAC SODIUM 75 MG PO TBEC: 75.0000 mg | DELAYED_RELEASE_TABLET | Freq: Two times a day (BID) | ORAL | Status: DC

## 2014-07-12 NOTE — Telephone Encounter (Signed)
Left voicemail letting pt know forms ready for pick-up

## 2014-07-12 NOTE — Patient Instructions (Signed)
Achilles Tendinitis Achilles tendinitis is inflammation of the tough, cord-like band that attaches the lower muscles of your leg to your heel (Achilles tendon). It is usually caused by overusing the tendon and joint involved.  CAUSES Achilles tendinitis can happen because of:  A sudden increase in exercise or activity (such as running).  Doing the same exercises or activities (such as jumping) over and over.  Not warming up calf muscles before exercising.  Exercising in shoes that are worn out or not made for exercise.  Having arthritis or a bone growth on the back of the heel bone. This can rub against the tendon and hurt the tendon. SIGNS AND SYMPTOMS The most common symptoms are:  Pain in the back of the leg, just above the heel. The pain usually gets worse with exercise and better with rest.  Stiffness or soreness in the back of the leg, especially in the morning.  Swelling of the skin over the Achilles tendon.  Trouble standing on tiptoe. Sometimes, an Achilles tendon tears (ruptures). Symptoms of an Achilles tendon rupture can include:  Sudden, severe pain in the back of the leg.  Trouble putting weight on the foot or walking normally. DIAGNOSIS Achilles tendinitis will be diagnosed based on symptoms and a physical examination. An X-ray may be done to check if another condition is causing your symptoms. An MRI may be ordered if your health care provider suspects you may have completely torn your tendon, which is called an Achilles tendon rupture.  TREATMENT  Achilles tendinitis usually gets better over time. It can take weeks to months to heal completely. Treatment focuses on treating the symptoms and helping the injury heal. HOME CARE INSTRUCTIONS   Rest your Achilles tendon and avoid activities that cause pain.  Apply ice to the injured area:  Put ice in a plastic bag.  Place a towel between your skin and the bag.  Leave the ice on for 20 minutes, 2-3 times a  day  Try to avoid using the tendon (other than gentle range of motion) while the tendon is painful. Do not resume use until instructed by your health care provider. Then begin use gradually. Do not increase use to the point of pain. If pain does develop, decrease use and continue the above measures. Gradually increase activities that do not cause discomfort until you achieve normal use.  Do exercises to make your calf muscles stronger and more flexible. Your health care provider or physical therapist can recommend exercises for you to do.  Wrap your ankle with an elastic bandage or other wrap. This can help keep your tendon from moving too much. Your health care provider will show you how to wrap your ankle correctly.  Only take over-the-counter or prescription medicines for pain, discomfort, or fever as directed by your health care provider. SEEK MEDICAL CARE IF:   Your pain and swelling increase or pain is uncontrolled with medicines.  You develop new, unexplained symptoms or your symptoms get worse.  You are unable to move your toes or foot.  You develop warmth and swelling in your foot.  You have an unexplained temperature. MAKE SURE YOU:   Understand these instructions.  Will watch your condition.  Will get help right away if you are not doing well or get worse. Document Released: 08/19/2005 Document Revised: 08/30/2013 Document Reviewed: 06/21/2013 [employer] Patient Information 2015 [employer]. This information is not intended to replace advice given to you by your health care provider. Make sure you discuss   any questions you have with your health care provider. Plantar Fasciitis Plantar fasciitis is a common condition that causes foot pain. It is soreness (inflammation) of the band of tough fibrous tissue on the bottom of the foot that runs from the heel bone (calcaneus) to the ball of the foot. The cause of this soreness may be from excessive standing, poor fitting shoes,  running on hard surfaces, being overweight, having an abnormal walk, or overuse (this is common in runners) of the painful foot or feet. It is also common in aerobic exercise dancers and ballet dancers. SYMPTOMS  Most people with plantar fasciitis complain of:  Severe pain in the morning on the bottom of their foot especially when taking the first steps out of bed. This pain recedes after a few minutes of walking.  Severe pain is experienced also during walking following a long period of inactivity.  Pain is worse when walking barefoot or up stairs DIAGNOSIS   Your caregiver will diagnose this condition by examining and feeling your foot.  Special tests such as X-rays of your foot, are usually not needed. PREVENTION   Consult a sports medicine professional before beginning a new exercise program.  Walking programs offer a good workout. With walking there is a lower chance of overuse injuries common to runners. There is less impact and less jarring of the joints.  Begin all new exercise programs slowly. If problems or pain develop, decrease the amount of time or distance until you are at a comfortable level.  Wear good shoes and replace them regularly.  Stretch your foot and the heel cords at the back of the ankle (Achilles tendon) both before and after exercise.  Run or exercise on even surfaces that are not hard. For example, asphalt is better than pavement.  Do not run barefoot on hard surfaces.  If using a treadmill, vary the incline.  Do not continue to workout if you have foot or joint problems. Seek professional help if they do not improve. HOME CARE INSTRUCTIONS   Avoid activities that cause you pain until you recover.  Use ice or cold packs on the problem or painful areas after working out.  Only take over-the-counter or prescription medicines for pain, discomfort, or fever as directed by your caregiver.  Soft shoe inserts or athletic shoes with air or gel sole  cushions may be helpful.  If problems continue or become more severe, consult a sports medicine caregiver or your own health care provider. Cortisone is a potent anti-inflammatory medication that may be injected into the painful area. You can discuss this treatment with your caregiver. MAKE SURE YOU:   Understand these instructions.  Will watch your condition.  Will get help right away if you are not doing well or get worse. Document Released: 08/04/2001 Document Revised: 02/01/2012 Document Reviewed: 10/03/2008 [facility] Patient Information 2015 [place], [place]. This information is not intended to replace advice given to you by your health care provider. Make sure you discuss any questions you have with your health care provider.

## 2014-07-12 NOTE — Progress Notes (Signed)
   Subjective:    Patient ID: Pamela Calhoun, female    DOB: 1986-07-06, 28 y.o.   MRN: [id number]  HPI Comments: 28 year old female presents the office today with complaints of left heel pain. She states the pain has been present for approximately 1.5 years and recently has increased. She states that she has pain with ambulation and with periods of standing. Also states that she has pain in the morning. She also complains of some mild numbness on the inside of her foot. She has tried an elastic ankle brace as well as a splint without any resolution. 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 She does have an injury a fracture to his foot however she does not think it was into the area of the heel. Denies any recent trauma. She is going to the beach on Tuesday and would like to have some relief of symptoms before going. No other complaints.  Foot Pain      Review of Systems  HENT:       Sinus problems   Musculoskeletal:       Back pain Difficulty walking Muscle pain   All other systems reviewed and are negative.      Objective:   Physical Exam  Nursing note and vitals reviewed. Constitutional: She is oriented to person, place, and time. She appears well-developed and well-nourished.  Musculoskeletal: She exhibits no edema.  Tenderness to palpation of the plantar medial tubercle of the calcaneus at the insertion of the plantar fascia. Plantar fascia intact. There is also tenderness over Baxter's nerve with the recreation of symptoms upon compression. Pain along the posterior aspect of the heel the insertion of the Achilles tendon. No pain with lateral compression of the calcaneus. Achilles tendon intact.  Neurological: She is alert and oriented to person, place, and time.  Protective sensation intact with the Semmes Weinstein monofilament and vibratory sensation intact.  Skin: No erythema.  Open lesions.  DP/PT pulses palpable 2/4 b/l. CRT < 3 sec to all digits.          Assessment & Plan:  28 year old female with likely left foot plantar fasciitis vs. Baxter's neuritis, insertional Achilles tendinitis.  -X-rays were obtained. The x-ray report for full details. No acute fracture. -Discussed both conservative and surgical intervention with the patient in great detail including alternatives, risks, complications of both. -At this time  I recommend MRI to be performed due to symptoms and ongoing pain despite some home treatment  -However this time the patient's insurance apparently will not cover MRI oral be too expensive for the patient she does not wish to proceed with the MRI at this time. At this time she is requesting a steroid injection to help bouts of inflammation. I explained to her that it may alleviate some of her symptoms but not completely or to make things worse. She understands. -Under sterile skin preparation a total of 2.5 cc of a mixture Kenalog 10, 0.5% Marcaine plain, 2% lidocaine plain was infiltrated in the plantar medial aspect of the heel around the  Plantar fascia. Patient tolerated injection well any complications. -Dispensed CAM boot. -Ice -Stretching exercises discussed -Followup in one month or sooner any problems are to arise. If symptoms persist obtained MRI. Also if not complete resolution of symptoms will consider injecting around Baxter's nerve.  -Call with any questions/concerns or worsening of symptoms.

## 2014-07-13 ENCOUNTER — Telehealth: Payer: Self-pay | Admitting: Podiatry

## 2014-07-13 ENCOUNTER — Encounter: Payer: Self-pay | Admitting: *Deleted

## 2014-07-13 MED ORDER — TRIAMCINOLONE ACETONIDE 10 MG/ML IJ SUSP: 10.0000 mg | Freq: Once | INTRAMUSCULAR | Status: DC

## 2014-07-13 NOTE — Telephone Encounter (Signed)
Call patient to followup with her in to see if she has any questions. States that she has some discomfort last night however she has been icing the area and her pain is improved. States that her pain is slightly better than what it was compared to yesterday before the injection. She continues to ice. She was unaware that the anti-inflammatory medications at her pharmacy so she will go pick it up now. Continue with CAM boot. Call with any questions/concerns or if symptoms worsen.

## 2014-07-13 NOTE — Progress Notes (Signed)
MRI Pamela Calhoun #[id number] expires 9.19.15

## 2014-07-17 ENCOUNTER — Telehealth: Payer: Self-pay | Admitting: *Deleted

## 2014-07-17 NOTE — Telephone Encounter (Signed)
Called and spoke with pt letting her know i called her insurance and have approval until 9.19.15. Asked pt if she would like to go ahead and schedule mri appt. Pt stated she would call me back.

## 2014-07-23 ENCOUNTER — Telehealth: Payer: Self-pay | Admitting: Family Medicine

## 2014-07-23 NOTE — Telephone Encounter (Signed)
Patient Information:  Caller Name: Melah  Phone: [phone]  Patient: Pamela Calhoun  Gender: Female  DOB: 1986-10-22  Age: 28 Years  PCP: Loura Pardon [facility])  Pregnant: No  Office Follow Up:  Does the office need to follow up with this patient?: No  Instructions For The Office: N/A  RN Note:  Called office, s/w Regina, and she advised to send pt to ED for evaluation (pt going to either [facility] or [facility] ED).  Symptoms  Reason For Call & Symptoms: Onset 07/23/2014 of generalized abdominal pain (rates 7/10) and vomiting x 12.  Reviewed Health History In EMR: Yes  Reviewed Medications In EMR: Yes  Reviewed Allergies In EMR: Yes  Reviewed Surgeries / Procedures: Yes  Date of Onset of Symptoms: 07/23/2014  Treatments Tried: Phenergan 10:00  Treatments Tried Worked: No OB / GYN:  LMP: 05/28/2014  Guideline(s) Used:  Abdominal Pain - Female  Disposition Per Guideline:   Go to ED Now (or to Office with PCP Approval)  Reason For Disposition Reached:   Constant abdominal pain lasting > 2 hours  Advice Given:  Call Back If:  Abdominal pain is constant and present for more than 2 hours  Abdominal pains come and go and are present for more than 24 hours  You are pregnant  You become worse.  Patient Will Follow Care Advice:  YES

## 2014-07-24 ENCOUNTER — Telehealth: Payer: Self-pay | Admitting: *Deleted

## 2014-07-24 ENCOUNTER — Other Ambulatory Visit: Payer: Self-pay | Admitting: Podiatry

## 2014-07-24 ENCOUNTER — Other Ambulatory Visit: Payer: Self-pay

## 2014-07-24 ENCOUNTER — Encounter: Payer: Self-pay | Admitting: *Deleted

## 2014-07-24 DIAGNOSIS — M722 Plantar fascial fibromatosis: Secondary | ICD-10-CM

## 2014-07-24 DIAGNOSIS — M7662 Achilles tendinitis, left leg: Secondary | ICD-10-CM

## 2014-07-24 NOTE — Telephone Encounter (Signed)
Spoke with Esperanza letting her know mri sch for 9.5.15 at 6:15pm arrival time 5:45pm at [facility] [address] [place]. Per dr Jacqualyn Posey ask pt how voltaren is working for her. Pt stated the voltaren is working good.

## 2014-07-24 NOTE — Telephone Encounter (Signed)
Called and left message for pt asking her to call me back to sch mri.

## 2014-07-24 NOTE — Progress Notes (Signed)
Per dr Jacqualyn Posey ask pt how she is doing from taking the voltaren. Make sure she is not having abdominal pain and vomiting from taking anti inflammatory. If she is, have pt d/c rx.

## 2014-07-28 ENCOUNTER — Ambulatory Visit
Admission: RE | Admit: 2014-07-28 | Discharge: 2014-07-28 | Disposition: A | Discharge status: Home / Self Care | Payer: BC Managed Care – PPO | Source: Ambulatory Visit | Attending: Podiatry | Admitting: Podiatry

## 2014-07-28 DIAGNOSIS — M722 Plantar fascial fibromatosis: Secondary | ICD-10-CM

## 2014-07-28 DIAGNOSIS — M7662 Achilles tendinitis, left leg: Secondary | ICD-10-CM

## 2014-08-16 ENCOUNTER — Ambulatory Visit (INDEPENDENT_AMBULATORY_CARE_PROVIDER_SITE_OTHER): Payer: BC Managed Care – PPO

## 2014-08-16 ENCOUNTER — Encounter: Payer: Self-pay | Admitting: Podiatry

## 2014-08-16 ENCOUNTER — Ambulatory Visit (INDEPENDENT_AMBULATORY_CARE_PROVIDER_SITE_OTHER): Payer: BC Managed Care – PPO | Admitting: Podiatry

## 2014-08-16 VITALS — BP 119/86 | HR 109 | Resp 16

## 2014-08-16 DIAGNOSIS — M7660 Achilles tendinitis, unspecified leg: Secondary | ICD-10-CM

## 2014-08-16 DIAGNOSIS — M7662 Achilles tendinitis, left leg: Secondary | ICD-10-CM

## 2014-08-16 DIAGNOSIS — M722 Plantar fascial fibromatosis: Secondary | ICD-10-CM

## 2014-08-16 DIAGNOSIS — M7661 Achilles tendinitis, right leg: Secondary | ICD-10-CM

## 2014-08-16 MED ORDER — TRIAMCINOLONE ACETONIDE 10 MG/ML IJ SUSP
10.0000 mg | Freq: Once | INTRAMUSCULAR | Status: AC
  Administered 2014-08-16: 10 mg

## 2014-08-16 NOTE — Progress Notes (Signed)
Patient ID: Pamela Calhoun, female   DOB: [birth year], 28 y.o.   MRN: [id number]  Subjective:  Pamela Calhoun returns the office they for followup evaluation of left plantar fasciitis and Achilles tendinitis. She states after the injection she had a plus appointment she had complete resolution of pain in her heel however has since returned. She's been continuing with a cam boot. Also states she continued with a full tear in what seems to help. Today she does that she started to develop the same pain in her right heel. Denies any injury or trauma to the area. No treatment for that side. No other complaints at this time. No acute changes since last appointment.  Objective: AAOx 3, NAD PT/DP pulses palpable bilaterally, CRT less than 3 seconds Protective sensation intact with Simms Weinstein monofilament, vibratory sensation intact, Achilles tendon reflex intact. Tenderness on the plantar medial tubercle of the calcaneus at the insertion of the plantar fascia bilaterally. No pain along the course of plantar fascial in the arch of the foot. No pain with lateral compression of the calcaneus. Mild discomfort at the insertion of the Achilles tendon on the posterior aspect of the calcaneus bilaterally. Equinus bilaterally. Decrease in medial arch height upon weightbearing. MMT 5/5, ROM WNL No open skin lesions No calf pain, swelling, warmth.  Assessment: 28 year old female with bilateral plantar fasciitis and retrocalcaneal exostosis/Achilles insertional tendinitis.  Plan: -X-rays were obtained of the right foot today. See x-ray report. No acute fractures. -Conservative versus surgical treatment were discussed including alternatives, risks, complications. -Patient elects to proceed with steroid injection into the bilateral heels. Under sterile skin preparation, a total of 2.5cc of kenalog 10, 0.5% Marcaine plain, and 2% lidocaine plain were infiltrated into the symptomatic area without complication. A band-aid  was applied. Patient tolerated the injection well without complication. This is the second injection on the left foot and first on the right foot. -Continue stretching exercises -Ice to the affected area -Conditions continue cam boot on left side and return to regular sneaker. Dispensed bilateral heel lifts to help take pressure off the Achilles tendon. As the discomfort decreases to slowly remove the heel it from his shoes. -Followup in 3 weeks or sooner if any palms are to arise or any changes in the symptoms. In the meantime call any questions, concerns.

## 2014-08-16 NOTE — Patient Instructions (Signed)
Plantar Fasciitis (Heel Spur Syndrome) with Rehab The plantar fascia is a fibrous, ligament-like, soft-tissue structure that spans the bottom of the foot. Plantar fasciitis is a condition that causes pain in the foot due to inflammation of the tissue. SYMPTOMS   Pain and tenderness on the underneath side of the foot.  Pain that worsens with standing or walking. CAUSES  Plantar fasciitis is caused by irritation and injury to the plantar fascia on the underneath side of the foot. Common mechanisms of injury include:  Direct trauma to bottom of the foot.  Damage to a small nerve that runs under the foot where the main fascia attaches to the heel bone.  Stress placed on the plantar fascia due to bone spurs. RISK INCREASES WITH:   Activities that place stress on the plantar fascia (running, jumping, pivoting, or cutting).  Poor strength and flexibility.  Improperly fitted shoes.  Tight calf muscles.  Flat feet.  Failure to warm-up properly before activity.  Obesity. PREVENTION  Warm up and stretch properly before activity.  Allow for adequate recovery between workouts.  Maintain physical fitness:  Strength, flexibility, and endurance.  Cardiovascular fitness.  Maintain a health body weight.  Avoid stress on the plantar fascia.  Wear properly fitted shoes, including arch supports for individuals who have flat feet. PROGNOSIS  If treated properly, then the symptoms of plantar fasciitis usually resolve without surgery. However, occasionally surgery is necessary. RELATED COMPLICATIONS   Recurrent symptoms that may result in a chronic condition.  Problems of the lower back that are caused by compensating for the injury, such as limping.  Pain or weakness of the foot during push-off following surgery.  Chronic inflammation, scarring, and partial or complete fascia tear, occurring more often from repeated injections. TREATMENT  Treatment initially involves the use of  ice and medication to help reduce pain and inflammation. The use of strengthening and stretching exercises may help reduce pain with activity, especially stretches of the Achilles tendon. These exercises may be performed at home or with a therapist. Your caregiver may recommend that you use heel cups of arch supports to help reduce stress on the plantar fascia. Occasionally, corticosteroid injections are given to reduce inflammation. If symptoms persist for greater than 6 months despite non-surgical (conservative), then surgery may be recommended.  MEDICATION   If pain medication is necessary, then nonsteroidal anti-inflammatory medications, such as aspirin and ibuprofen, or other minor pain relievers, such as acetaminophen, are often recommended.  Do not take pain medication within 7 days before surgery.  Prescription pain relievers may be given if deemed necessary by your caregiver. Use only as directed and only as much as you need.  Corticosteroid injections may be given by your caregiver. These injections should be reserved for the most serious cases, because they may only be given a certain number of times. HEAT AND COLD  Cold treatment (icing) relieves pain and reduces inflammation. Cold treatment should be applied for 10 to 15 minutes every 2 to 3 hours for inflammation and pain and immediately after any activity that aggravates your symptoms. Use ice packs or massage the area with a piece of ice (ice massage).  Heat treatment may be used prior to performing the stretching and strengthening activities prescribed by your caregiver, physical therapist, or athletic trainer. Use a heat pack or soak the injury in warm water. SEEK IMMEDIATE MEDICAL CARE IF:  Treatment seems to offer no benefit, or the condition worsens.  Any medications produce adverse side effects. EXERCISES RANGE   OF MOTION (ROM) AND STRETCHING EXERCISES - Plantar Fasciitis (Heel Spur Syndrome) These exercises may help you  when beginning to rehabilitate your injury. Your symptoms may resolve with or without further involvement from your physician, physical therapist or athletic trainer. While completing these exercises, remember:   Restoring tissue flexibility helps normal motion to return to the joints. This allows healthier, less painful movement and activity.  An effective stretch should be held for at least 30 seconds.  A stretch should never be painful. You should only feel a gentle lengthening or release in the stretched tissue. RANGE OF MOTION - Toe Extension, Flexion  Sit with your right / left leg crossed over your opposite knee.  Grasp your toes and gently pull them back toward the top of your foot. You should feel a stretch on the bottom of your toes and/or foot.  Hold this stretch for __________ seconds.  Now, gently pull your toes toward the bottom of your foot. You should feel a stretch on the top of your toes and or foot.  Hold this stretch for __________ seconds. Repeat __________ times. Complete this stretch __________ times per day.  RANGE OF MOTION - Ankle Dorsiflexion, Active Assisted  Remove shoes and sit on a chair that is preferably not on a carpeted surface.  Place right / left foot under knee. Extend your opposite leg for support.  Keeping your heel down, slide your right / left foot back toward the chair until you feel a stretch at your ankle or calf. If you do not feel a stretch, slide your bottom forward to the edge of the chair, while still keeping your heel down.  Hold this stretch for __________ seconds. Repeat __________ times. Complete this stretch __________ times per day.  STRETCH - Gastroc, Standing  Place hands on wall.  Extend right / left leg, keeping the front knee somewhat bent.  Slightly point your toes inward on your back foot.  Keeping your right / left heel on the floor and your knee straight, shift your weight toward the wall, not allowing your back to  arch.  You should feel a gentle stretch in the right / left calf. Hold this position for __________ seconds. Repeat __________ times. Complete this stretch __________ times per day. STRETCH - Soleus, Standing  Place hands on wall.  Extend right / left leg, keeping the other knee somewhat bent.  Slightly point your toes inward on your back foot.  Keep your right / left heel on the floor, bend your back knee, and slightly shift your weight over the back leg so that you feel a gentle stretch deep in your back calf.  Hold this position for __________ seconds. Repeat __________ times. Complete this stretch __________ times per day. STRETCH - Gastrocsoleus, Standing  Note: This exercise can place a lot of stress on your foot and ankle. Please complete this exercise only if specifically instructed by your caregiver.   Place the ball of your right / left foot on a step, keeping your other foot firmly on the same step.  Hold on to the wall or a rail for balance.  Slowly lift your other foot, allowing your body weight to press your heel down over the edge of the step.  You should feel a stretch in your right / left calf.  Hold this position for __________ seconds.  Repeat this exercise with a slight bend in your right / left knee. Repeat __________ times. Complete this stretch __________ times per day.    STRENGTHENING EXERCISES - Plantar Fasciitis (Heel Spur Syndrome)  These exercises may help you when beginning to rehabilitate your injury. They may resolve your symptoms with or without further involvement from your physician, physical therapist or athletic trainer. While completing these exercises, remember:   Muscles can gain both the endurance and the strength needed for everyday activities through controlled exercises.  Complete these exercises as instructed by your physician, physical therapist or athletic trainer. Progress the resistance and repetitions only as guided. STRENGTH -  Towel Curls  Sit in a chair positioned on a non-carpeted surface.  Place your foot on a towel, keeping your heel on the floor.  Pull the towel toward your heel by only curling your toes. Keep your heel on the floor.  If instructed by your physician, physical therapist or athletic trainer, add ____________________ at the end of the towel. Repeat __________ times. Complete this exercise __________ times per day. STRENGTH - Ankle Inversion  Secure one end of a rubber exercise band/tubing to a fixed object (table, pole). Loop the other end around your foot just before your toes.  Place your fists between your knees. This will focus your strengthening at your ankle.  Slowly, pull your big toe up and in, making sure the band/tubing is positioned to resist the entire motion.  Hold this position for __________ seconds.  Have your muscles resist the band/tubing as it slowly pulls your foot back to the starting position. Repeat __________ times. Complete this exercises __________ times per day.  Document Released: 11/09/2005 Document Revised: 02/01/2012 Document Reviewed: 02/21/2009 Exit[employer] Patient Information 2015 [employer]. This information is not intended to replace advice given to you by your health care provider. Make sure you discuss any questions you have with your health care provider.  

## 2014-08-23 ENCOUNTER — Other Ambulatory Visit: Payer: Self-pay | Admitting: Obstetrics and Gynecology

## 2014-08-24 LAB — CYTOLOGY - PAP

## 2014-08-31 ENCOUNTER — Encounter: Payer: Self-pay | Admitting: Family Medicine

## 2014-08-31 ENCOUNTER — Ambulatory Visit (INDEPENDENT_AMBULATORY_CARE_PROVIDER_SITE_OTHER): Payer: BC Managed Care – PPO | Admitting: Family Medicine

## 2014-08-31 ENCOUNTER — Encounter: Payer: Self-pay | Admitting: *Deleted

## 2014-08-31 VITALS — BP 110/70 | HR 90 | Temp 98.0°F | Wt 152.8 lb

## 2014-08-31 DIAGNOSIS — J019 Acute sinusitis, unspecified: Secondary | ICD-10-CM | POA: Insufficient documentation

## 2014-08-31 MED ORDER — FLUTICASONE PROPIONATE 50 MCG/ACT NA SUSP: 2.0000 | Freq: Every day | NASAL | Status: DC

## 2014-08-31 MED ORDER — AMOXICILLIN-POT CLAVULANATE 875-125 MG PO TABS: 1.0000 | ORAL_TABLET | Freq: Two times a day (BID) | ORAL | Status: AC

## 2014-08-31 MED ORDER — GUAIFENESIN-CODEINE 100-10 MG/5ML PO SYRP: 5.0000 mL | ORAL_SOLUTION | Freq: Two times a day (BID) | ORAL | Status: DC | PRN

## 2014-08-31 NOTE — Progress Notes (Signed)
BP 110/70  Pulse 90  Temp(Src) 98 F (36.7 C) (Oral)  Wt 152 lb 12 oz (69.287 kg)  SpO2 97%  LMP 08/24/2014   CC: sinusitis?  Subjective:    Patient ID: Pamela Calhoun, female    DOB: [birth year], 28 y.o.   MRN: [id number]  HPI: Pamela Calhoun is a 28 y.o. female presenting on 08/31/2014 for URI   2 wk h/o sinus congestion, 1 wk h/o cough. Cough getting worse. Cough productive of yellow/white sputum. Chest feels tight. Facial pressure. + headaches daily - doesn't usually get headaches. + bilateral earache. No tooth pain. + PNdrainage leading to nausea. Low grade fevers present that started last few days. + body aches as well. progressively getting worse.  Tried alka seltzer, tylenol cold/sinus and cough. Works in preschool - sick contacts abound. Mother smokes inside. No h/o asthma.  Relevant past medical, surgical, family and social history reviewed and updated as indicated.  Allergies and medications reviewed and updated. Current Outpatient Prescriptions on File Prior to Visit  Medication Sig  . ALPRAZolam (XANAX) 0.5 MG tablet   . diclofenac (VOLTAREN) 75 MG EC tablet Take 1 tablet (75 mg total) by mouth 2 (two) times daily.  . fexofenadine (ALLEGRA) 180 MG tablet Take 180 mg by mouth daily.  . hydrOXYzine (VISTARIL) 25 MG capsule Take 25 mg by mouth daily.  [name] 0.15-0.03 &0.01 MG TABS Take 1 tablet by mouth daily. As directed   . pentosan polysulfate (ELMIRON) 100 MG capsule Take 100 mg by mouth. Take 2 by mouth at bedtime    Current Facility-Administered Medications on File Prior to Visit  Medication  . triamcinolone acetonide (KENALOG) 10 MG/ML injection 10 mg    Review of Systems Per HPI unless specifically indicated above    Objective:    BP 110/70  Pulse 90  Temp(Src) 98 F (36.7 C) (Oral)  Wt 152 lb 12 oz (69.287 kg)  SpO2 97%  LMP 08/24/2014  Physical Exam  Nursing note and vitals reviewed. Constitutional: She appears  well-developed and well-nourished. No distress.  HENT:  Head: Normocephalic and atraumatic.  Right Ear: Hearing, tympanic membrane, external ear and ear canal normal.  Left Ear: Hearing, tympanic membrane, external ear and ear canal normal.  Nose: Mucosal edema (marked pallor and edema R>L) and rhinorrhea present. Right sinus exhibits maxillary sinus tenderness and frontal sinus tenderness. Left sinus exhibits maxillary sinus tenderness and frontal sinus tenderness.  Mouth/Throat: Uvula is midline and mucous membranes are normal. Posterior oropharyngeal erythema (mild) present. No oropharyngeal exudate, posterior oropharyngeal edema or tonsillar abscesses.  Eyes: Conjunctivae and EOM are normal. Pupils are equal, round, and reactive to light. No scleral icterus.  Neck: Normal range of motion. Neck supple.  Cardiovascular: Normal rate, regular rhythm, normal heart sounds and intact distal pulses.   No murmur heard. Pulmonary/Chest: Effort normal and breath sounds normal. No respiratory distress. She has no wheezes. She has no rales.  Lymphadenopathy:    She has no cervical adenopathy.  Skin: Skin is warm and dry. No rash noted.       Assessment & Plan:   Problem List Items Addressed This Visit   Acute sinusitis - Primary     Anticipate bacterial acute sinusitis given duration and progression of sxs - treat with augmentin course. Push fluids and rest cheratussin for cough at night time, flonase for sinus inflammation evident today. Note for work for today provided today. Pt agrees with plan.    Relevant  Medications      amoxicillin-clavulanate (AUGMENTIN) tablet 875-125 mg      flonase      cheratussin       Follow up plan: Return if symptoms worsen or fail to improve.

## 2014-08-31 NOTE — Patient Instructions (Signed)
You have a sinus infection, likely bacterial given how long it's been going on.. Take medicine as prescribed:augmentin 10 day course Push fluids and plenty of rest. Nasal saline irrigation or neti pot to help drain sinuses. Start flonase as well for allergies. May use simple mucinex with plenty of fluid to help mobilize mucous. Let us know if fever >101.5, trouble opening/closing mouth, difficulty swallowing, or worsening - you may need to be seen again.

## 2014-08-31 NOTE — Assessment & Plan Note (Signed)
Anticipate bacterial acute sinusitis given duration and progression of sxs - treat with augmentin course. Push fluids and rest cheratussin for cough at night time, flonase for sinus inflammation evident today. Note for work for today provided today. Pt agrees with plan.

## 2014-08-31 NOTE — Progress Notes (Signed)
Pre visit review using our clinic review tool, if applicable. No additional management support is needed unless otherwise documented below in the visit note. 

## 2014-09-03 ENCOUNTER — Ambulatory Visit (INDEPENDENT_AMBULATORY_CARE_PROVIDER_SITE_OTHER): Payer: BC Managed Care – PPO | Admitting: Family Medicine

## 2014-09-03 ENCOUNTER — Encounter: Payer: Self-pay | Admitting: Family Medicine

## 2014-09-03 VITALS — BP 110/74 | HR 92 | Temp 98.1°F | Ht 70.0 in | Wt 154.5 lb

## 2014-09-03 DIAGNOSIS — J019 Acute sinusitis, unspecified: Secondary | ICD-10-CM

## 2014-09-03 MED ORDER — LEVOFLOXACIN 500 MG PO TABS: 500.0000 mg | ORAL_TABLET | Freq: Every day | ORAL | Status: DC

## 2014-09-03 NOTE — Assessment & Plan Note (Signed)
Slight improvement -but had fever yesterday and diarrhea Disc poss of diarrhea from augmentin- so will change to levaquin Disc symptomatic care - see instructions on AVS  Has cough med  out of work 2 more days and reassess Update if not starting to improve in a week or if worsening

## 2014-09-03 NOTE — Progress Notes (Signed)
Pre visit review using our clinic review tool, if applicable. No additional management support is needed unless otherwise documented below in the visit note. 

## 2014-09-03 NOTE — Patient Instructions (Signed)
I think augmentin may be causing some of your GI issues  Change to levaquin-I sent it to your pharmacy  Drink lots of fluid Eat when you take your medicine  Keep [place] posted- especially if fever worsens  Update if not starting to improve in a week or if worsening

## 2014-09-03 NOTE — Progress Notes (Signed)
Subjective:    Patient ID: Pamela Calhoun, female    DOB: 26-May-1986, 28 y.o.   MRN: [id number]  HPI Here for f/u of sinusitis  tx with augmentin on 10/9 - also cheratussin   Over the weekend -had a fever  Stomach hurt and she also had some diarrhea  over the weekend  ? From augmentin  Temp was 101.7 yesterday   Taking advil   Sinus pain has improved Very congestion- very sniffly Yellow mucous  Cough is ok - worse at night - cough med is helping    Patient Active Problem List   Diagnosis Date Noted  . Acute sinusitis 08/31/2014  . UTI (urinary tract infection) 06/20/2014  . MRSA (methicillin resistant Staphylococcus aureus) carrier 04/12/2014  . ALLERGIC RHINITIS 03/22/2007  . SKIN CANCER, HX OF 03/22/2007   Past Medical History  Diagnosis Date  . Allergy   . Cancer     skin- hystory of melanoma   . Depression   . Ureteral reflux     as a child   . PID (acute pelvic inflammatory disease)   . Endometriosis   . Interstitial cystitis   . Frequent UTI   . History of shingles   . Pap smear, abnormal     cryosx   . Fracture     right foot   Past Surgical History  Procedure Laterality Date  . Tonsillectomy    . Melanoma excision      Right leg   . Laparoscopy      pelvic    History  Substance Use Topics  . Smoking status: Former Research scientist (life sciences)  . Smokeless tobacco: Never Used     Comment: quit 2 years ago  . Alcohol Use: Yes     Comment: occassionally   Family History  Problem Relation Age of Onset  . Cancer Maternal Grandmother     breast   Allergies  Allergen Reactions  . Cetirizine Hcl     REACTION: diarrhea  . Paroxetine     REACTION: nausea  . Vicodin [Hydrocodone-Acetaminophen] Itching   Current Outpatient Prescriptions on File Prior to Visit  Medication Sig Dispense Refill  . ALPRAZolam (XANAX) 0.5 MG tablet       . amoxicillin-clavulanate (AUGMENTIN) 875-125 MG per tablet Take 1 tablet by mouth 2 (two) times daily.  20 tablet  0  .  diclofenac (VOLTAREN) 75 MG EC tablet Take 1 tablet (75 mg total) by mouth 2 (two) times daily.  30 tablet  1  . fexofenadine (ALLEGRA) 180 MG tablet Take 180 mg by mouth daily.      . fluticasone (FLONASE) 50 MCG/ACT nasal spray Place 2 sprays into both nostrils daily.  16 g  3  . guaiFENesin-codeine (ROBITUSSIN AC) 100-10 MG/5ML syrup Take 5 mLs by mouth 2 (two) times daily as needed for cough (sedation precautions).  140 mL  0  . hydrOXYzine (VISTARIL) 25 MG capsule Take 25 mg by mouth daily.      [name] 0.15-0.03 &0.01 MG TABS Take 1 tablet by mouth daily. As directed       . pentosan polysulfate (ELMIRON) 100 MG capsule Take 100 mg by mouth. Take 2 by mouth at bedtime        Current Facility-Administered Medications on File Prior to Visit  Medication Dose Route Frequency Provider Last Rate Last Dose  . triamcinolone acetonide (KENALOG) 10 MG/ML injection 10 mg  10 mg Other Once Celesta Gentile, DPM  Review of Systems Review of Systems  Constitutional: Negative for , appetite change,  and unexpected weight change. pos for malaise  ENT pos for congestion/rhinorrhea and sinus pain that is improved  Eyes: Negative for pain and visual disturbance.  Respiratory: Negative for wheeze  and shortness of breath.   Cardiovascular: Negative for cp or palpitations    Gastrointestinal: Negative for nausea, diarrhea and constipation.  Genitourinary: Negative for urgency and frequency.  Skin: Negative for pallor or rash   Neurological: Negative for weakness, light-headedness, numbness and headaches.  Hematological: Negative for adenopathy. Does not bruise/bleed easily.  Psychiatric/Behavioral: Negative for dysphoric mood. The patient is not nervous/anxious.         Objective:   Physical Exam  Constitutional: She appears well-developed and well-nourished. No distress.  HENT:  Head: Normocephalic and atraumatic.  Right Ear: External ear normal.  Left Ear:  External ear normal.  Mouth/Throat: Oropharynx is clear and moist. No oropharyngeal exudate.  Nares are injected and congested  R maxillary sinus tenderness Post nasal drainage   Eyes: Conjunctivae and EOM are normal. Pupils are equal, round, and reactive to light. Right eye exhibits no discharge. Left eye exhibits no discharge.  Neck: Normal range of motion. Neck supple.  Cardiovascular: Normal rate, regular rhythm and normal heart sounds.   Pulmonary/Chest: Effort normal and breath sounds normal. No respiratory distress. She has no wheezes. She has no rales.  Lymphadenopathy:    She has no cervical adenopathy.  Neurological: She is alert.  Skin: Skin is warm and dry. No rash noted. No pallor.  Psychiatric: She has a normal mood and affect.          Assessment & Plan:   Problem List Items Addressed This Visit     Respiratory   Acute sinusitis - Primary     Slight improvement -but had fever yesterday and diarrhea Disc poss of diarrhea from augmentin- so will change to levaquin Disc symptomatic care - see instructions on AVS  Has cough med  out of work 2 more days and reassess Update if not starting to improve in a week or if worsening      Relevant Medications      levofloxacin (LEVAQUIN) tablet

## 2014-09-06 ENCOUNTER — Ambulatory Visit: Payer: BC Managed Care – PPO | Admitting: Podiatry

## 2014-09-24 ENCOUNTER — Encounter: Payer: Self-pay | Admitting: Family Medicine

## 2014-10-04 ENCOUNTER — Ambulatory Visit (INDEPENDENT_AMBULATORY_CARE_PROVIDER_SITE_OTHER): Payer: PRIVATE HEALTH INSURANCE | Admitting: Podiatry

## 2014-10-04 ENCOUNTER — Encounter: Payer: Self-pay | Admitting: Podiatry

## 2014-10-04 VITALS — BP 117/84 | HR 99 | Resp 16

## 2014-10-04 DIAGNOSIS — M722 Plantar fascial fibromatosis: Secondary | ICD-10-CM

## 2014-10-04 DIAGNOSIS — M7662 Achilles tendinitis, left leg: Secondary | ICD-10-CM

## 2014-10-04 MED ORDER — MELOXICAM 15 MG PO TABS: 15.0000 mg | ORAL_TABLET | Freq: Every day | ORAL | Status: DC

## 2014-10-04 MED ORDER — TRIAMCINOLONE ACETONIDE 10 MG/ML IJ SUSP
10.0000 mg | Freq: Once | INTRAMUSCULAR | Status: AC
  Administered 2014-10-04: 10 mg

## 2014-10-04 NOTE — Patient Instructions (Signed)
Plantar Fasciitis (Heel Spur Syndrome) with Rehab The plantar fascia is a fibrous, ligament-like, soft-tissue structure that spans the bottom of the foot. Plantar fasciitis is a condition that causes pain in the foot due to inflammation of the tissue. SYMPTOMS   Pain and tenderness on the underneath side of the foot.  Pain that worsens with standing or walking. CAUSES  Plantar fasciitis is caused by irritation and injury to the plantar fascia on the underneath side of the foot. Common mechanisms of injury include:  Direct trauma to bottom of the foot.  Damage to a small nerve that runs under the foot where the main fascia attaches to the heel bone.  Stress placed on the plantar fascia due to bone spurs. RISK INCREASES WITH:   Activities that place stress on the plantar fascia (running, jumping, pivoting, or cutting).  Poor strength and flexibility.  Improperly fitted shoes.  Tight calf muscles.  Flat feet.  Failure to warm-up properly before activity.  Obesity. PREVENTION  Warm up and stretch properly before activity.  Allow for adequate recovery between workouts.  Maintain physical fitness:  Strength, flexibility, and endurance.  Cardiovascular fitness.  Maintain a health body weight.  Avoid stress on the plantar fascia.  Wear properly fitted shoes, including arch supports for individuals who have flat feet. PROGNOSIS  If treated properly, then the symptoms of plantar fasciitis usually resolve without surgery. However, occasionally surgery is necessary. RELATED COMPLICATIONS   Recurrent symptoms that may result in a chronic condition.  Problems of the lower back that are caused by compensating for the injury, such as limping.  Pain or weakness of the foot during push-off following surgery.  Chronic inflammation, scarring, and partial or complete fascia tear, occurring more often from repeated injections. TREATMENT  Treatment initially involves the use of  ice and medication to help reduce pain and inflammation. The use of strengthening and stretching exercises may help reduce pain with activity, especially stretches of the Achilles tendon. These exercises may be performed at home or with a therapist. Your caregiver may recommend that you use heel cups of arch supports to help reduce stress on the plantar fascia. Occasionally, corticosteroid injections are given to reduce inflammation. If symptoms persist for greater than 6 months despite non-surgical (conservative), then surgery may be recommended.  MEDICATION   If pain medication is necessary, then nonsteroidal anti-inflammatory medications, such as aspirin and ibuprofen, or other minor pain relievers, such as acetaminophen, are often recommended.  Do not take pain medication within 7 days before surgery.  Prescription pain relievers may be given if deemed necessary by your caregiver. Use only as directed and only as much as you need.  Corticosteroid injections may be given by your caregiver. These injections should be reserved for the most serious cases, because they may only be given a certain number of times. HEAT AND COLD  Cold treatment (icing) relieves pain and reduces inflammation. Cold treatment should be applied for 10 to 15 minutes every 2 to 3 hours for inflammation and pain and immediately after any activity that aggravates your symptoms. Use ice packs or massage the area with a piece of ice (ice massage).  Heat treatment may be used prior to performing the stretching and strengthening activities prescribed by your caregiver, physical therapist, or athletic trainer. Use a heat pack or soak the injury in warm water. SEEK IMMEDIATE MEDICAL CARE IF:  Treatment seems to offer no benefit, or the condition worsens.  Any medications produce adverse side effects. EXERCISES RANGE   OF MOTION (ROM) AND STRETCHING EXERCISES - Plantar Fasciitis (Heel Spur Syndrome) These exercises may help you  when beginning to rehabilitate your injury. Your symptoms may resolve with or without further involvement from your physician, physical therapist or athletic trainer. While completing these exercises, remember:   Restoring tissue flexibility helps normal motion to return to the joints. This allows healthier, less painful movement and activity.  An effective stretch should be held for at least 30 seconds.  A stretch should never be painful. You should only feel a gentle lengthening or release in the stretched tissue. RANGE OF MOTION - Toe Extension, Flexion  Sit with your right / left leg crossed over your opposite knee.  Grasp your toes and gently pull them back toward the top of your foot. You should feel a stretch on the bottom of your toes and/or foot.  Hold this stretch for __________ seconds.  Now, gently pull your toes toward the bottom of your foot. You should feel a stretch on the top of your toes and or foot.  Hold this stretch for __________ seconds. Repeat __________ times. Complete this stretch __________ times per day.  RANGE OF MOTION - Ankle Dorsiflexion, Active Assisted  Remove shoes and sit on a chair that is preferably not on a carpeted surface.  Place right / left foot under knee. Extend your opposite leg for support.  Keeping your heel down, slide your right / left foot back toward the chair until you feel a stretch at your ankle or calf. If you do not feel a stretch, slide your bottom forward to the edge of the chair, while still keeping your heel down.  Hold this stretch for __________ seconds. Repeat __________ times. Complete this stretch __________ times per day.  STRETCH - Gastroc, Standing  Place hands on wall.  Extend right / left leg, keeping the front knee somewhat bent.  Slightly point your toes inward on your back foot.  Keeping your right / left heel on the floor and your knee straight, shift your weight toward the wall, not allowing your back to  arch.  You should feel a gentle stretch in the right / left calf. Hold this position for __________ seconds. Repeat __________ times. Complete this stretch __________ times per day. STRETCH - Soleus, Standing  Place hands on wall.  Extend right / left leg, keeping the other knee somewhat bent.  Slightly point your toes inward on your back foot.  Keep your right / left heel on the floor, bend your back knee, and slightly shift your weight over the back leg so that you feel a gentle stretch deep in your back calf.  Hold this position for __________ seconds. Repeat __________ times. Complete this stretch __________ times per day. STRETCH - Gastrocsoleus, Standing  Note: This exercise can place a lot of stress on your foot and ankle. Please complete this exercise only if specifically instructed by your caregiver.   Place the ball of your right / left foot on a step, keeping your other foot firmly on the same step.  Hold on to the wall or a rail for balance.  Slowly lift your other foot, allowing your body weight to press your heel down over the edge of the step.  You should feel a stretch in your right / left calf.  Hold this position for __________ seconds.  Repeat this exercise with a slight bend in your right / left knee. Repeat __________ times. Complete this stretch __________ times per day.    STRENGTHENING EXERCISES - Plantar Fasciitis (Heel Spur Syndrome)  These exercises may help you when beginning to rehabilitate your injury. They may resolve your symptoms with or without further involvement from your physician, physical therapist or athletic trainer. While completing these exercises, remember:   Muscles can gain both the endurance and the strength needed for everyday activities through controlled exercises.  Complete these exercises as instructed by your physician, physical therapist or athletic trainer. Progress the resistance and repetitions only as guided. STRENGTH -  Towel Curls  Sit in a chair positioned on a non-carpeted surface.  Place your foot on a towel, keeping your heel on the floor.  Pull the towel toward your heel by only curling your toes. Keep your heel on the floor.  If instructed by your physician, physical therapist or athletic trainer, add ____________________ at the end of the towel. Repeat __________ times. Complete this exercise __________ times per day. STRENGTH - Ankle Inversion  Secure one end of a rubber exercise band/tubing to a fixed object (table, pole). Loop the other end around your foot just before your toes.  Place your fists between your knees. This will focus your strengthening at your ankle.  Slowly, pull your big toe up and in, making sure the band/tubing is positioned to resist the entire motion.  Hold this position for __________ seconds.  Have your muscles resist the band/tubing as it slowly pulls your foot back to the starting position. Repeat __________ times. Complete this exercises __________ times per day.  Document Released: 11/09/2005 Document Revised: 02/01/2012 Document Reviewed: 02/21/2009 Exit[employer] Patient Information 2015 [employer]. This information is not intended to replace advice given to you by your health care provider. Make sure you discuss any questions you have with your health care provider.  

## 2014-10-07 DIAGNOSIS — M722 Plantar fascial fibromatosis: Secondary | ICD-10-CM | POA: Insufficient documentation

## 2014-10-07 NOTE — Progress Notes (Signed)
Patient ID: Pamela Calhoun, female   DOB: September 02, 1986, 28 y.o.   MRN: [id number]  Subjective: 28 year old female returns the office they for follow up evaluation of bilateral plantar fasciitis and Achilles tendinitis of the left greater than the right. She states that after receiving the injection she does have resolution of symptoms after. Time the symptoms do return. She states that she does continue with the stretching and the icing intermittently however not on a consistent basis. She no longer is wearing the cam walker. She denies any recent injury or trauma to the area. No acute changes since last appointment. No other complaints at this time. Denies any systemic complaints such as fevers, chills, nausea, vomiting.  Objective: AAO x3, NAD DP/PT pulses palpable bilaterally, CRT less than 3 seconds Protective sensation intact with Simms Weinstein monofilament, vibratory sensation intact, Achilles tendon reflex intact Tenderness to palpation over the plantar medial aspect of the calcaneus at the insertion of the plantar fascia bilaterally with the left greater than the right. There is no pain along the course of the plantar fascia within the arch of the foot. The plantar fascia does appear to be intact. There is mild tenderness along the posterior aspect of the left calcaneus at the insertion of the Achilles tendon. There is no pain along the course of the Achilles tendon itself. There is a retrocalcaneal exostosis palpable on the left. There is no overlying edema, erythema, increase in warmth to bilateral feet. No open lesions or pre-ulcerative lesions. No calf pain, swelling, warmth, erythema  Assessment: 28 year old female with bilateral heel pain likely plantar fasciitis insertional Achilles tendinitis left greater than right.  Plan: -Conservative versus surgical options were discussed the patient including alternatives, risks, complications. -Patient elects to proceed with steroid injection  into the left plantar heel. Under sterile skin preparation, a total of 2.5cc of kenalog 10, 0.5% Marcaine plain, and 2% lidocaine plain were infiltrated into the symptomatic area without complication. A band-aid was applied. Patient tolerated the injection well without complication.  -Discussed with the patient to continue stretching exercises. -Ice to the effected area. -Patient has purchased a night splint. -Continue plantar fascial brace. -Prescribed Mobic. I discussed with her side effects of this medication and directed to stop immediately if any are to occur call the office. -Discussed that if symptoms do not improve at next visit we'll likely refer to physical therapy. -Follow-up in 4 weeks or sooner if any problems are to arise. In the meantime, call the office any questions, concerns, change in symptoms.

## 2014-10-15 ENCOUNTER — Encounter: Payer: Self-pay | Admitting: Family Medicine

## 2014-10-15 ENCOUNTER — Ambulatory Visit (INDEPENDENT_AMBULATORY_CARE_PROVIDER_SITE_OTHER): Payer: PRIVATE HEALTH INSURANCE | Admitting: Family Medicine

## 2014-10-15 VITALS — BP 118/70 | HR 101 | Temp 98.0°F | Ht 70.0 in | Wt 156.5 lb

## 2014-10-15 DIAGNOSIS — B9789 Other viral agents as the cause of diseases classified elsewhere: Principal | ICD-10-CM

## 2014-10-15 DIAGNOSIS — J069 Acute upper respiratory infection, unspecified: Secondary | ICD-10-CM

## 2014-10-15 MED ORDER — AZITHROMYCIN 250 MG PO TABS: ORAL_TABLET | ORAL | Status: DC

## 2014-10-15 MED ORDER — GUAIFENESIN-CODEINE 100-10 MG/5ML PO SYRP: 5.0000 mL | ORAL_SOLUTION | Freq: Four times a day (QID) | ORAL | Status: DC | PRN

## 2014-10-15 NOTE — Patient Instructions (Signed)
Drink lots of fluids  Rest  Increase flonase to twice daily for 1-2 weeks to clear your ear  Get afrin nasal spray - 12 hour type -use for 2 days -then stop because it can be habit forming    (will open up sinuses and ears) During work - mucinex DM , at night guifenesin codeine (caution of sedation)   If worse - fill px for zpak   Update if not starting to improve in a week or if worsening

## 2014-10-15 NOTE — Progress Notes (Signed)
Subjective:    Patient ID: Pamela Calhoun, female    DOB: October 26, 1986, 28 y.o.   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 Plantar fasciitis 10/07/2014  . Acute sinusitis 08/31/2014  . UTI (urinary tract infection) 06/20/2014  . MRSA (methicillin resistant Staphylococcus aureus) carrier 04/12/2014  . ALLERGIC RHINITIS 03/22/2007  . SKIN CANCER, HX OF 03/22/2007   Past Medical History  Diagnosis Date  . Allergy   . Cancer     skin- hystory of melanoma   . Depression   . Ureteral reflux     as a child   . PID (acute pelvic inflammatory disease)   . Endometriosis   . Interstitial cystitis   . Frequent UTI   . History of shingles   . Pap smear, abnormal     cryosx   . Fracture     right foot   Past Surgical History  Procedure Laterality Date  . Tonsillectomy    . Melanoma excision      Right leg   . Laparoscopy      pelvic    History  Substance Use Topics  . Smoking status: Former Research scientist (life sciences)  . Smokeless tobacco: Never Used     Comment: quit 2 years ago  . Alcohol Use: 0.0 oz/week    0 Not specified per week     Comment: occassionally   Family History  Problem Relation Age of Onset  . Cancer Maternal Grandmother     breast   Allergies  Allergen Reactions  . Cetirizine Hcl     REACTION: diarrhea  . Paroxetine     REACTION: nausea  . Vicodin [Hydrocodone-Acetaminophen] Itching   Current Outpatient Prescriptions on File Prior to Visit  Medication Sig Dispense Refill  . ALPRAZolam (XANAX) 0.5 MG tablet     . fexofenadine (ALLEGRA) 180 MG tablet Take 180 mg by mouth daily.    . fluticasone (FLONASE) 50 MCG/ACT nasal  spray Place 2 sprays into both nostrils daily. 16 g 3  . hydrOXYzine (VISTARIL) 25 MG capsule Take 25 mg by mouth daily.    [name] 0.15-0.03 &0.01 MG TABS Take 1 tablet by mouth daily. As directed     . meloxicam (MOBIC) 15 MG tablet Take 1 tablet (15 mg total) by mouth daily. 30 tablet 2  . pentosan polysulfate (ELMIRON) 100 MG capsule Take 100 mg by mouth. Take 2 by mouth at bedtime      Current Facility-Administered Medications on File Prior to Visit  Medication Dose Route Frequency Provider Last Rate Last Dose  . triamcinolone acetonide (KENALOG) 10 MG/ML injection 10 mg  10 mg Other Once Celesta Gentile, DPM          Review of Systems Review of Systems  Constitutional: Negative for fever, appetite change,  and unexpected weight change.  ENT pos for cong and rhinorrhea and ear pressure and st  Eyes: Negative for pain and visual disturbance.  Respiratory: Negative for wheeze  and shortness of breath.   Cardiovascular: Negative for  cp or palpitations    Gastrointestinal: Negative for nausea, diarrhea and constipation.  Genitourinary: Negative for urgency and frequency.  Skin: Negative for pallor or rash   Neurological: Negative for weakness, light-headedness, numbness and headaches.  Hematological: Negative for adenopathy. Does not bruise/bleed easily.  Psychiatric/Behavioral: Negative for dysphoric mood. The patient is not nervous/anxious.         Objective:   Physical Exam  Constitutional: She appears well-developed and well-nourished. No distress.  HENT:  Head: Normocephalic and atraumatic.  Right Ear: External ear normal.  Left Ear: External ear normal.  Mouth/Throat: Oropharynx is clear and moist.  Nares are injected and congested  No sinus tenderness TMs are bilat dull /no erythema  Clear rhinorrhea noted   Eyes: Conjunctivae and EOM are normal. Pupils are equal, round, and reactive to light. Right eye exhibits no discharge. Left eye exhibits  no discharge.  Neck: Normal range of motion. Neck supple.  Cardiovascular: Regular rhythm and normal heart sounds.   Pulmonary/Chest: Effort normal and breath sounds normal. No respiratory distress. She has no wheezes. She has no rales.  Lymphadenopathy:    She has no cervical adenopathy.  Neurological: She is alert.  Skin: Skin is warm and dry. No rash noted. No erythema. No pallor.  Psychiatric: She has a normal mood and affect.          Assessment & Plan:   Problem List Items Addressed This Visit      Respiratory   Viral URI with cough - Primary    With ETD Adv use of steroid ns  Also afrin ns for 2 d only (disc risk of dependence) Disc symptomatic care - see instructions on AVS  Given px for zpak to fill over the holiday if she does worsen or not improving     Relevant Medications      azithromycin (ZITHROMAX) tablet

## 2014-10-15 NOTE — Progress Notes (Signed)
Pre visit review using our clinic review tool, if applicable. No additional management support is needed unless otherwise documented below in the visit note. 

## 2014-10-19 NOTE — Assessment & Plan Note (Signed)
With ETD Adv use of steroid ns  Also afrin ns for 2 d only (disc risk of dependence) Disc symptomatic care - see instructions on AVS  Given px for zpak to fill over the holiday if she does worsen or not improving

## 2014-11-01 ENCOUNTER — Ambulatory Visit: Payer: PRIVATE HEALTH INSURANCE | Admitting: Podiatry

## 2014-11-12 ENCOUNTER — Ambulatory Visit (INDEPENDENT_AMBULATORY_CARE_PROVIDER_SITE_OTHER): Payer: PRIVATE HEALTH INSURANCE | Admitting: Family Medicine

## 2014-11-12 ENCOUNTER — Encounter: Payer: Self-pay | Admitting: Family Medicine

## 2014-11-12 VITALS — BP 128/68 | HR 79 | Temp 98.3°F | Ht 70.0 in | Wt 159.5 lb

## 2014-11-12 DIAGNOSIS — J0191 Acute recurrent sinusitis, unspecified: Secondary | ICD-10-CM

## 2014-11-12 DIAGNOSIS — J029 Acute pharyngitis, unspecified: Secondary | ICD-10-CM

## 2014-11-12 LAB — POCT RAPID STREP A (OFFICE): Rapid Strep A Screen: NEGATIVE

## 2014-11-12 MED ORDER — AMOXICILLIN-POT CLAVULANATE 875-125 MG PO TABS: 1.0000 | ORAL_TABLET | Freq: Two times a day (BID) | ORAL | Status: DC

## 2014-11-12 NOTE — Assessment & Plan Note (Signed)
Cover with augmentin  Disc symptomatic care - see instructions on AVS - saline/ steam/ afrin for 2 d if needed otc ibuprofen for pain and fever   Update if not starting to improve in a week or if worsening

## 2014-11-12 NOTE — Progress Notes (Signed)
Pre visit review using our clinic review tool, if applicable. No additional management support is needed unless otherwise documented below in the visit note. 

## 2014-11-12 NOTE — Progress Notes (Signed)
Subjective:    Patient ID: Pamela Calhoun, female    DOB: Jul 13, 1986, 28 y.o.   MRN: [id number]  HPI Here with uri symptoms   Has had a cold for over a week  A lot of sneezing  Not sleeping - cough Face hurts - both sides - worse under eyes  Throat hurts also (raw) Blowing out yellow mucous  Think she had a fever this am (aches and chills)   Alka selzer cold  advil and tylenol es for aches prn  Drinking lots of fluids    Results for orders placed or performed in visit on 11/12/14  Rapid Strep A  Result Value Ref Range   Rapid Strep A Screen Negative Negative    Patient Active Problem List   Diagnosis Date Noted  . Viral URI with cough 10/15/2014  . Plantar fasciitis 10/07/2014  . Acute sinusitis 08/31/2014  . UTI (urinary tract infection) 06/20/2014  . MRSA (methicillin resistant Staphylococcus aureus) carrier 04/12/2014  . ALLERGIC RHINITIS 03/22/2007  . SKIN CANCER, HX OF 03/22/2007   Past Medical History  Diagnosis Date  . Allergy   . Cancer     skin- hystory of melanoma   . Depression   . Ureteral reflux     as a child   . PID (acute pelvic inflammatory disease)   . Endometriosis   . Interstitial cystitis   . Frequent UTI   . History of shingles   . Pap smear, abnormal     cryosx   . Fracture     right foot   Past Surgical History  Procedure Laterality Date  . Tonsillectomy    . Melanoma excision      Right leg   . Laparoscopy      pelvic    History  Substance Use Topics  . Smoking status: Former Research scientist (life sciences)  . Smokeless tobacco: Never Used     Comment: quit 2 years ago  . Alcohol Use: 0.0 oz/week    0 Not specified per week     Comment: occassionally   Family History  Problem Relation Age of Onset  . Cancer Maternal Grandmother     breast   Allergies  Allergen Reactions  . Cetirizine Hcl     REACTION: diarrhea  . Paroxetine     REACTION: nausea  . Vicodin [Hydrocodone-Acetaminophen] Itching   Current Outpatient Prescriptions on  File Prior to Visit  Medication Sig Dispense Refill  . ALPRAZolam (XANAX) 0.5 MG tablet     . fexofenadine (ALLEGRA) 180 MG tablet Take 180 mg by mouth daily.    . fluticasone (FLONASE) 50 MCG/ACT nasal spray Place 2 sprays into both nostrils daily. 16 g 3  . hydrOXYzine (VISTARIL) 25 MG capsule Take 25 mg by mouth daily.    [name] 0.15-0.03 &0.01 MG TABS Take 1 tablet by mouth daily. As directed     . meloxicam (MOBIC) 15 MG tablet Take 1 tablet (15 mg total) by mouth daily. 30 tablet 2  . pentosan polysulfate (ELMIRON) 100 MG capsule Take 100 mg by mouth. Take 2 by mouth at bedtime      Current Facility-Administered Medications on File Prior to Visit  Medication Dose Route Frequency Provider Last Rate Last Dose  . triamcinolone acetonide (KENALOG) 10 MG/ML injection 10 mg  10 mg Other Once Celesta Gentile, DPM         Review of Systems Review of Systems  Constitutional: pos for malaise and poss fever ENT  pos for sinus pain / congestion/ear pain and ST Eyes: Negative for pain and visual disturbance.  Respiratory: Negative for wheeze  and shortness of breath.   Cardiovascular: Negative for cp or palpitations    Gastrointestinal: Negative for nausea, diarrhea and constipation.  Genitourinary: Negative for urgency and frequency.  Skin: Negative for pallor or rash   Neurological: Negative for weakness, light-headedness, numbness and headaches.  Hematological: Negative for adenopathy. Does not bruise/bleed easily.  Psychiatric/Behavioral: Negative for dysphoric mood. The patient is not nervous/anxious.         Objective:   Physical Exam  Constitutional: She appears well-developed and well-nourished. No distress.  HENT:  Head: Normocephalic and atraumatic.  Right Ear: External ear normal.  Left Ear: External ear normal.  Mouth/Throat: Oropharynx is clear and moist. No oropharyngeal exudate.  Nares are injected and congested  bilat frontal and maxillary  sinus tenderness (worse on L) TMs dull Throat clear   Eyes: Conjunctivae and EOM are normal. Pupils are equal, round, and reactive to light. Right eye exhibits no discharge. Left eye exhibits no discharge.  Neck: Normal range of motion. Neck supple.  Cardiovascular: Normal rate and regular rhythm.   Pulmonary/Chest: Effort normal and breath sounds normal. No respiratory distress. She has no wheezes. She has no rales.  Lymphadenopathy:    She has no cervical adenopathy.  Neurological: She is alert. No cranial nerve deficit.  Skin: Skin is warm and dry. No rash noted.  Psychiatric: She has a normal mood and affect.          Assessment & Plan:   Problem List Items Addressed This Visit      Respiratory   Acute sinusitis    Cover with augmentin  Disc symptomatic care - see instructions on AVS - saline/ steam/ afrin for 2 d if needed otc ibuprofen for pain and fever   Update if not starting to improve in a week or if worsening      Relevant Medications      amoxicillin-clavulanate (AUGMENTIN) tablet 875-125 mg    Other Visit Diagnoses    Sore throat    -  Primary    Relevant Orders       Rapid Strep A (Completed)

## 2014-11-12 NOTE — Patient Instructions (Signed)
Fluids and rest  Take augmentin as directed for sinus infection  mucinex to loosen mucous  Afrin is ok for 2 days maximum   (due to rebound congestion)  Ibuprofen for pain and fever  Warm compresses on face Breathe steam    Update if not starting to improve in a week or if worsening

## 2014-11-14 ENCOUNTER — Telehealth: Payer: Self-pay

## 2014-11-14 NOTE — Telephone Encounter (Signed)
Letter faxed and pt notified.

## 2014-11-14 NOTE — Telephone Encounter (Signed)
Done and in IN box 

## 2014-11-14 NOTE — Telephone Encounter (Addendum)
Pt left v/m;pt was seen 11/12/14 and needs note faxed to pts work. Left v/m requesting dates pt has missed work and fax # for work and update on how pt is feeling. Pt called back;pt still feels bad with sinus infection; sinus pressure, drainage cause S/T and body aches.pt request note to be out of work 11/13/14 and 11/14/14. Work fax # [phone] and this is a secured fax.

## 2014-12-24 ENCOUNTER — Encounter: Payer: Self-pay | Admitting: Family Medicine

## 2014-12-24 ENCOUNTER — Ambulatory Visit (INDEPENDENT_AMBULATORY_CARE_PROVIDER_SITE_OTHER): Payer: PRIVATE HEALTH INSURANCE | Admitting: Family Medicine

## 2014-12-24 VITALS — BP 128/78 | HR 94 | Temp 98.2°F | Ht 70.0 in | Wt 159.8 lb

## 2014-12-24 DIAGNOSIS — J209 Acute bronchitis, unspecified: Secondary | ICD-10-CM | POA: Insufficient documentation

## 2014-12-24 MED ORDER — AZITHROMYCIN 250 MG PO TABS: ORAL_TABLET | ORAL | Status: DC

## 2014-12-24 MED ORDER — GUAIFENESIN-CODEINE 100-10 MG/5ML PO SYRP: 5.0000 mL | ORAL_SOLUTION | Freq: Four times a day (QID) | ORAL | Status: DC | PRN

## 2014-12-24 MED ORDER — BENZONATATE 200 MG PO CAPS: 200.0000 mg | ORAL_CAPSULE | Freq: Three times a day (TID) | ORAL | Status: DC | PRN

## 2014-12-24 NOTE — Progress Notes (Signed)
Pre visit review using our clinic review tool, if applicable. No additional management support is needed unless otherwise documented below in the visit note. 

## 2014-12-24 NOTE — Assessment & Plan Note (Signed)
With over 3 weeks of symptoms  Re assuring exam  Cover with zpak  Fluid/rest Disc symptomatic care - see instructions on AVS  Tessalon for daytime cough  Robitussin ac (not allergic) for pm as needed with warning of sedation  Update if not starting to improve in a week or if worsening

## 2014-12-24 NOTE — Progress Notes (Signed)
Subjective:    Patient ID: Pamela Calhoun, female    DOB: 1986/01/03, 29 y.o.   MRN: [id number]  HPI Here with uri symptoms   She was exp to sick kids at work and nephew also   Started coughing 3 weeks ago  Sore throat  Stuffy and runny nose  A little facial pressure/no pain  Green nasal d/c  Bad prod cough with a lot of chest congestion - green as well   Fever yesterday - low grade temp this am  Day quil/ nyquil help Peppermint tea   Patient Active Problem List   Diagnosis Date Noted  . Plantar fasciitis 10/07/2014  . Acute sinusitis 08/31/2014  . MRSA (methicillin resistant Staphylococcus aureus) carrier 04/12/2014  . ALLERGIC RHINITIS 03/22/2007  . SKIN CANCER, HX OF 03/22/2007   Past Medical History  Diagnosis Date  . Allergy   . Cancer     skin- hystory of melanoma   . Depression   . Ureteral reflux     as a child   . PID (acute pelvic inflammatory disease)   . Endometriosis   . Interstitial cystitis   . Frequent UTI   . History of shingles   . Pap smear, abnormal     cryosx   . Fracture     right foot   Past Surgical History  Procedure Laterality Date  . Tonsillectomy    . Melanoma excision      Right leg   . Laparoscopy      pelvic    History  Substance Use Topics  . Smoking status: Former Research scientist (life sciences)  . Smokeless tobacco: Never Used     Comment: quit 2 years ago  . Alcohol Use: 0.0 oz/week    0 Not specified per week     Comment: occassionally   Family History  Problem Relation Age of Onset  . Cancer Maternal Grandmother     breast   Allergies  Allergen Reactions  . Cetirizine Hcl     REACTION: diarrhea  . Paroxetine     REACTION: nausea  . Vicodin [Hydrocodone-Acetaminophen] Itching   Current Outpatient Prescriptions on File Prior to Visit  Medication Sig Dispense Refill  . ALPRAZolam (XANAX) 0.5 MG tablet     . amoxicillin-clavulanate (AUGMENTIN) 875-125 MG per tablet Take 1 tablet by mouth 2 (two) times daily. 20 tablet 0  .  fexofenadine (ALLEGRA) 180 MG tablet Take 180 mg by mouth daily.    . fluticasone (FLONASE) 50 MCG/ACT nasal spray Place 2 sprays into both nostrils daily. 16 g 3  . hydrOXYzine (VISTARIL) 25 MG capsule Take 25 mg by mouth daily.    [name] 0.15-0.03 &0.01 MG TABS Take 1 tablet by mouth daily. As directed     . meloxicam (MOBIC) 15 MG tablet Take 1 tablet (15 mg total) by mouth daily. 30 tablet 2  . pentosan polysulfate (ELMIRON) 100 MG capsule Take 100 mg by mouth. Take 2 by mouth at bedtime      Current Facility-Administered Medications on File Prior to Visit  Medication Dose Route Frequency Provider Last Rate Last Dose  . triamcinolone acetonide (KENALOG) 10 MG/ML injection 10 mg  10 mg Other Once Celesta Gentile, DPM          Review of Systems Review of Systems  Constitutional: Negative for fever, appetite change, fatigue and unexpected weight change.  Eyes: Negative for pain and visual disturbance.  ENT pos for cong/rhinorrhea and ST, neg for sinus pain  Respiratory: Negative for wheeze  and shortness of breath.   Cardiovascular: Negative for cp or palpitations    Gastrointestinal: Negative for nausea, diarrhea and constipation.  Genitourinary: Negative for urgency and frequency.  Skin: Negative for pallor or rash   Neurological: Negative for weakness, light-headedness, numbness and headaches.  Hematological: Negative for adenopathy. Does not bruise/bleed easily.  Psychiatric/Behavioral: Negative for dysphoric mood. The patient is not nervous/anxious.         Objective:   Physical Exam  Constitutional: She appears well-developed and well-nourished. No distress.  HENT:  Head: Normocephalic and atraumatic.  Right Ear: External ear normal.  Left Ear: External ear normal.  Mouth/Throat: Oropharynx is clear and moist. No oropharyngeal exudate.  Nares are injected and congested  No sinus tenderness Throat clear with post nasal drip   Eyes: Conjunctivae  and EOM are normal. Pupils are equal, round, and reactive to light. Right eye exhibits no discharge. Left eye exhibits no discharge.  Neck: Normal range of motion. Neck supple.  Cardiovascular: Normal rate and regular rhythm.   Pulmonary/Chest: Effort normal and breath sounds normal. No respiratory distress. She has no wheezes. She has no rales.  Harsh bs with a few rhonchi  No rales Some upper airway sounds   Lymphadenopathy:    She has no cervical adenopathy.  Neurological: She is alert.  Skin: Skin is warm and dry. No rash noted.  Psychiatric: She has a normal mood and affect.          Assessment & Plan:   Problem List Items Addressed This Visit      Respiratory   Acute bronchitis - Primary    With over 3 weeks of symptoms  Re assuring exam  Cover with zpak  Fluid/rest Disc symptomatic care - see instructions on AVS  Tessalon for daytime cough  Robitussin ac (not allergic) for pm as needed with warning of sedation  Update if not starting to improve in a week or if worsening

## 2014-12-24 NOTE — Patient Instructions (Addendum)
Continue mucinex DM Try tessalon for cough as well  You can use the robitussin AC when not working or driving (night time) Fluids and rest  Take zithromax for bronchitis since you have been sick for 3 weeks Update if not starting to improve in a week or if worsening

## 2015-01-23 ENCOUNTER — Encounter: Payer: Self-pay | Admitting: Family Medicine

## 2015-01-23 ENCOUNTER — Ambulatory Visit (INDEPENDENT_AMBULATORY_CARE_PROVIDER_SITE_OTHER): Payer: PRIVATE HEALTH INSURANCE | Admitting: Family Medicine

## 2015-01-23 VITALS — BP 114/70 | HR 92 | Temp 98.3°F | Ht 70.0 in | Wt 161.1 lb

## 2015-01-23 DIAGNOSIS — N898 Other specified noninflammatory disorders of vagina: Secondary | ICD-10-CM | POA: Insufficient documentation

## 2015-01-23 DIAGNOSIS — R300 Dysuria: Secondary | ICD-10-CM | POA: Insufficient documentation

## 2015-01-23 DIAGNOSIS — Z029 Encounter for administrative examinations, unspecified: Secondary | ICD-10-CM | POA: Insufficient documentation

## 2015-01-23 DIAGNOSIS — Z113 Encounter for screening for infections with a predominantly sexual mode of transmission: Secondary | ICD-10-CM

## 2015-01-23 LAB — POCT URINALYSIS DIPSTICK
Bilirubin, UA: NEGATIVE
Glucose, UA: NEGATIVE
Ketones, UA: NEGATIVE
NITRITE UA: NEGATIVE
UROBILINOGEN UA: 0.2
pH, UA: 6

## 2015-01-23 MED ORDER — METRONIDAZOLE 500 MG PO TABS: 500.0000 mg | ORAL_TABLET | Freq: Two times a day (BID) | ORAL | Status: DC

## 2015-01-23 NOTE — Progress Notes (Signed)
Pre visit review using our clinic review tool, if applicable. No additional management support is needed unless otherwise documented below in the visit note. 

## 2015-01-23 NOTE — Progress Notes (Signed)
Subjective:    Patient ID: Pamela Calhoun, female    DOB: 05-Oct-1986, 29 y.o.   MRN: [id number]  HPI Here with vaginal discharge  Over the past week  Yellow  Some itching /not burning  Some pelvic pain - worse on the L and middle - feels crampy LMP 2 mo ago -on seasonique (long phase pill)- so not due yet    Pain to urinate for 5 days  Some frequency/ urgency No blood in urine  Feels more like an infection than IC   Results for orders placed or performed in visit on 01/23/15  POCT urinalysis dipstick  Result Value Ref Range   Color, UA yellow    Clarity, UA cloudy    Glucose, UA negative    Bilirubin, UA negative    Ketones, UA negative    Spec Grav, UA >=1.030    Blood, UA +-    pH, UA 6.0    Protein, UA +-    Urobilinogen, UA 0.2    Nitrite, UA negative    Leukocytes, UA large (3+)    she drinks a fair amt of water - may not be enough however High specific gravity    Last intercourse 1 mo ago - condom broke - wants STD screen   Patient Active Problem List   Diagnosis Date Noted  . Acute bronchitis 12/24/2014  . Plantar fasciitis 10/07/2014  . MRSA (methicillin resistant Staphylococcus aureus) carrier 04/12/2014  . ALLERGIC RHINITIS 03/22/2007  . SKIN CANCER, HX OF 03/22/2007   Past Medical History  Diagnosis Date  . Allergy   . Cancer     skin- hystory of melanoma   . Depression   . Ureteral reflux     as a child   . PID (acute pelvic inflammatory disease)   . Endometriosis   . Interstitial cystitis   . Frequent UTI   . History of shingles   . Pap smear, abnormal     cryosx   . Fracture     right foot   Past Surgical History  Procedure Laterality Date  . Tonsillectomy    . Melanoma excision      Right leg   . Laparoscopy      pelvic    History  Substance Use Topics  . Smoking status: Former Research scientist (life sciences)  . Smokeless tobacco: Never Used     Comment: quit 2 years ago  . Alcohol Use: 0.0 oz/week    0 Standard drinks or equivalent per week    Comment: occassionally   Family History  Problem Relation Age of Onset  . Cancer Maternal Grandmother     breast   Allergies  Allergen Reactions  . Cetirizine Hcl     REACTION: diarrhea  . Paroxetine     REACTION: nausea  . Vicodin [Hydrocodone-Acetaminophen] Itching   Current Outpatient Prescriptions on File Prior to Visit  Medication Sig Dispense Refill  . ALPRAZolam (XANAX) 0.5 MG tablet     . hydrOXYzine (VISTARIL) 25 MG capsule Take 25 mg by mouth daily.    Pamela Calhoun 91-Day 0.15-0.03 &0.01 MG TABS Take 1 tablet by mouth daily. As directed     . meloxicam (MOBIC) 15 MG tablet Take 1 tablet (15 mg total) by mouth daily. 30 tablet 2  . pentosan polysulfate (ELMIRON) 100 MG capsule Take 100 mg by mouth. Take 2 by mouth at bedtime      Current Facility-Administered Medications on File Prior to Visit  Medication Dose Route  Frequency Provider Last Rate Last Dose  . triamcinolone acetonide (KENALOG) 10 MG/ML injection 10 mg  10 mg Other Once Celesta Gentile, DPM          Review of Systems    Review of Systems  Constitutional: Negative for fever, appetite change, fatigue and unexpected weight change.  Eyes: Negative for pain and visual disturbance.  Respiratory: Negative for cough and shortness of breath.   Cardiovascular: Negative for cp or palpitations    Gastrointestinal: Negative for nausea, diarrhea and constipation.  Genitourinary: Negative for urgency and frequency. pos for vaginal d/c and suprapubic pain  Skin: Negative for pallor or rash   Neurological: Negative for weakness, light-headedness, numbness and headaches.  Hematological: Negative for adenopathy. Does not bruise/bleed easily.  Psychiatric/Behavioral: Negative for dysphoric mood. The patient is not nervous/anxious.      Objective:   Physical Exam  Constitutional: She appears well-developed and well-nourished. No distress.  HENT:  Head: Normocephalic and atraumatic.  Eyes: Conjunctivae  and EOM are normal. Pupils are equal, round, and reactive to light. No scleral icterus.  Neck: Normal range of motion. Neck supple.  Cardiovascular: Normal rate and regular rhythm.   Abdominal: Soft. Bowel sounds are normal. She exhibits no distension. There is tenderness. There is no rebound and no guarding.  Mild suprapubic tenderness No cva tenderness   Genitourinary:  Moderate thin yellow to grey vaginal d/c with odor No cervical changes or lesions  Lymphadenopathy:    She has no cervical adenopathy.  Neurological: She is alert.  Skin: Skin is warm and dry. No rash noted. No erythema.  Psychiatric: She has a normal mood and affect.          Assessment & Plan:   Problem List Items Addressed This Visit      Other   Dysuria    Suspect due to vaginitis Urine also sent for cx however Enc water intake       Relevant Orders   Urine culture   Screen for STD (sexually transmitted disease)    Trich dx today Gc/chlamydia pending Pt declined hiv/rpr today      Vaginal discharge - Primary    With trichomonas on wet prep  Disc condition and tx  Will get partner treated  tx with flagyl 500 mg bid for 7 d  Disc safe sex/use of condoms gc /chlam tests pending       Relevant Orders   POCT Wet Prep [employer])    Other Visit Diagnoses    Burning with urination        Relevant Orders    POCT urinalysis dipstick (Completed)

## 2015-01-23 NOTE — Patient Instructions (Signed)
Drink lots of water  We will send urine for a culture  Treat trichomonas with flagyl 500 mg twice daily for 7 days  Get partner tested/treated also   Pending the other std screening tests

## 2015-01-24 LAB — POCT WET PREP (WET MOUNT): KOH Wet Prep POC: NEGATIVE

## 2015-01-24 LAB — GC/CHLAMYDIA PROBE AMP
CT Probe RNA: NEGATIVE
GC Probe RNA: NEGATIVE

## 2015-01-24 NOTE — Assessment & Plan Note (Signed)
With trichomonas on wet prep  Disc condition and tx  Will get partner treated  tx with flagyl 500 mg bid for 7 d  Disc safe sex/use of condoms gc /chlam tests pending

## 2015-01-24 NOTE — Assessment & Plan Note (Signed)
Suspect due to vaginitis Urine also sent for cx however Enc water intake

## 2015-01-24 NOTE — Assessment & Plan Note (Signed)
Trich dx today Gc/chlamydia pending Pt declined hiv/rpr today

## 2015-01-25 LAB — URINE CULTURE: Colony Count: 4000

## 2015-04-01 ENCOUNTER — Encounter: Payer: Self-pay | Admitting: Emergency Medicine

## 2015-04-01 ENCOUNTER — Other Ambulatory Visit: Payer: Self-pay

## 2015-04-01 ENCOUNTER — Emergency Department
Admission: EM | Admit: 2015-04-01 | Discharge: 2015-04-01 | Disposition: A | Discharge status: Home / Self Care | Payer: PRIVATE HEALTH INSURANCE | Attending: Emergency Medicine | Admitting: Emergency Medicine

## 2015-04-01 DIAGNOSIS — Z9119 Patient's noncompliance with other medical treatment and regimen: Secondary | ICD-10-CM | POA: Diagnosis not present

## 2015-04-01 DIAGNOSIS — R55 Syncope and collapse: Secondary | ICD-10-CM | POA: Insufficient documentation

## 2015-04-01 DIAGNOSIS — Z79899 Other long term (current) drug therapy: Secondary | ICD-10-CM | POA: Diagnosis not present

## 2015-04-01 DIAGNOSIS — Z87891 Personal history of nicotine dependence: Secondary | ICD-10-CM | POA: Insufficient documentation

## 2015-04-01 LAB — CBC
HCT: 40.2 % (ref 35.0–47.0)
Hemoglobin: 13 g/dL (ref 12.0–16.0)
MCH: 29.5 pg (ref 26.0–34.0)
MCHC: 32.4 g/dL (ref 32.0–36.0)
MCV: 91.1 fL (ref 80.0–100.0)
PLATELETS: 275 10*3/uL (ref 150–440)
RBC: 4.41 MIL/uL (ref 3.80–5.20)
RDW: 13.1 % (ref 11.5–14.5)
WBC: 11.1 10*3/uL — AB (ref 3.6–11.0)

## 2015-04-01 LAB — URINALYSIS COMPLETE WITH MICROSCOPIC (ARMC ONLY)
BACTERIA UA: NONE SEEN
Bilirubin Urine: NEGATIVE
GLUCOSE, UA: NEGATIVE mg/dL
Ketones, ur: NEGATIVE mg/dL
LEUKOCYTES UA: NEGATIVE
Nitrite: NEGATIVE
PH: 7 (ref 5.0–8.0)
Protein, ur: NEGATIVE mg/dL
Specific Gravity, Urine: 1.009 (ref 1.005–1.030)

## 2015-04-01 LAB — BASIC METABOLIC PANEL
ANION GAP: 9 (ref 5–15)
BUN: 13 mg/dL (ref 6–20)
CHLORIDE: 107 mmol/L (ref 101–111)
CO2: 24 mmol/L (ref 22–32)
Calcium: 9.2 mg/dL (ref 8.9–10.3)
Creatinine, Ser: 0.85 mg/dL (ref 0.44–1.00)
GFR calc Af Amer: >60 mL/min (ref >60)
GFR calc non Af Amer: >60 mL/min (ref >60)
GLUCOSE: 93 mg/dL (ref 65–99)
Potassium: 3.4 mmol/L — ABNORMAL LOW (ref 3.5–5.1)
Sodium: 140 mmol/L (ref 135–145)

## 2015-04-01 LAB — TROPONIN I: Troponin I: <0.03 ng/mL (ref <0.031)

## 2015-04-01 MED ORDER — SODIUM CHLORIDE 0.9 % IV SOLN
Freq: Once | INTRAVENOUS | Status: AC
  Administered 2015-04-01: 13:00:00 via INTRAVENOUS

## 2015-04-01 NOTE — ED Notes (Signed)
Pt given food tray, family at bedside Decie Verne, Evelina Bucy, RN

## 2015-04-01 NOTE — ED Notes (Signed)
Pt arrives with complaints of disorientation and confusion, pt states she woke up this AM to get ready for work and woke up in the hallway with loss of bowel and bladder control and tingling and numbness in her arms, pt states nausea and occasional dizziness, pt states she did have diaherra and high fever last Wednesday but states she has been fine ever since, pt awake and alert in no acute distress upon assessment Shey Bartmess, Evelina Bucy, RN

## 2015-04-01 NOTE — ED Provider Notes (Signed)
[facility] Emergency Department Provider Note    Time seen: 12:40 PM  I have reviewed the triage vital signs and the nursing notes.   HISTORY  Chief Complaint Loss of Consciousness    HPI Pamela Calhoun is a 29 y.o. female presents ER for syncopal episode today. Patient states he woke up and was getting out of the shower and then syncopized she had lost control of her bowel bladder function according to her. Patient states he had feeling lightheaded prior to that this had recent illness last week with fever and diarrhea. She states she feels better currently has not eating anything yet today. Denies any history of syncope, states she has has anxiety in the past. Denies any pain from the fall.     Past Medical History  Diagnosis Date  . Allergy   . Cancer     skin- hystory of melanoma   . Depression   . Ureteral reflux     as a child   . PID (acute pelvic inflammatory disease)   . Endometriosis   . Interstitial cystitis   . Frequent UTI   . History of shingles   . Pap smear, abnormal     cryosx   . Fracture     right foot    Patient Active Problem List   Diagnosis Date Noted  . Vaginal discharge 01/23/2015  . Dysuria 01/23/2015  . Screen for STD (sexually transmitted disease) 01/23/2015  . Acute bronchitis 12/24/2014  . Plantar fasciitis 10/07/2014  . MRSA (methicillin resistant Staphylococcus aureus) carrier 04/12/2014  . ALLERGIC RHINITIS 03/22/2007  . SKIN CANCER, HX OF 03/22/2007    Past Surgical History  Procedure Laterality Date  . Tonsillectomy    . Melanoma excision      Right leg   . Laparoscopy      pelvic     Current Outpatient Rx  Name  Route  Sig  Dispense  Refill  . ALPRAZolam (XANAX) 0.5 MG tablet   Oral   Take 0.5 mg by mouth 2 (two) times daily as needed (bladder spazms).          . hydrOXYzine (VISTARIL) 25 MG capsule   Oral   Take 25 mg by mouth at bedtime.          [name]  0.15-0.03 &0.01 MG TABS   Oral   Take 1 tablet by mouth daily.          . Multiple Vitamin (MULTIVITAMIN WITH MINERALS) TABS tablet   Oral   Take 1 tablet by mouth daily.         . pentosan polysulfate (ELMIRON) 100 MG capsule   Oral   Take 200 mg by mouth at bedtime. Take 2 by mouth at bedtime         . meloxicam (MOBIC) 15 MG tablet   Oral   Take 1 tablet (15 mg total) by mouth daily. Patient not taking: Reported on 04/01/2015   30 tablet   2   . metroNIDAZOLE (FLAGYL) 500 MG tablet   Oral   Take 1 tablet (500 mg total) by mouth 2 (two) times daily. Patient not taking: Reported on 04/01/2015   14 tablet   0     Allergies Cetirizine hcl; Paroxetine; and Vicodin  Family History  Problem Relation Age of Onset  . Cancer Maternal Grandmother     breast    Social History History  Substance Use Topics  . Smoking status: Former  Smoker  . Smokeless tobacco: Former Systems developer     Comment: quit 2 years ago  . Alcohol Use: 0.6 oz/week    1 Standard drinks or equivalent per week     Comment: occassionally    Review of Systems Constitutional: Negative for fever. Eyes: Negative for visual changes. ENT: Negative for sore throat. Cardiovascular: Negative for chest pain. Respiratory: Negative for shortness of breath. Gastrointestinal: Negative for abdominal pain, vomiting and diarrhea. Genitourinary: Negative for dysuria. Musculoskeletal: Negative for back pain. Skin: Negative for rash. Neurological: Negative for headaches, focal weakness or numbness.  10-point ROS otherwise negative.  ____________________________________________   PHYSICAL EXAM:  VITAL SIGNS: ED Triage Vitals  Enc Vitals Group     BP 04/01/15 1133 129/72 mmHg     Pulse Rate 04/01/15 1133 75     Resp 04/01/15 1215 15     Temp 04/01/15 1133 98 F (36.7 C)     Temp Source 04/01/15 1133 Oral     SpO2 04/01/15 1133 100 %     Weight 04/01/15 1133 160 lb (72.576 kg)     Height 04/01/15 1133 5\' 10"   (1.778 m)     Head Cir --      Peak Flow --      Pain Score 04/01/15 1134 3     Pain Loc --      Pain Edu? --      Excl. in [place]? --     Constitutional: Alert and oriented. Well appearing and in no distress. Eyes: Conjunctivae are normal. PERRL. Normal extraocular movements. ENT   Head: Normocephalic and atraumatic.   Nose: No congestion/rhinnorhea.   Mouth/Throat: Mucous membranes are moist.   Neck: No stridor. Hematological/Lymphatic/Immunilogical: No cervical lymphadenopathy. Cardiovascular: Normal rate, regular rhythm. Normal and symmetric distal pulses are present in all extremities. No murmurs, rubs, or gallops. Respiratory: Normal respiratory effort without tachypnea nor retractions. Breath sounds are clear and equal bilaterally. No wheezes/rales/rhonchi. Gastrointestinal: Soft and nontender. No distention. No abdominal bruits. There is no CVA tenderness. Musculoskeletal: Nontender with normal range of motion in all extremities. No joint effusions.  No lower extremity tenderness nor edema. Neurologic:  Normal speech and language. No gross focal neurologic deficits are appreciated. Speech is normal. No gait instability. Skin:  Skin is warm, dry and intact. No rash noted. Psychiatric: Mood and affect are normal. Speech and behavior are normal. Patient exhibits appropriate insight and judgment.  ____________________________________________    LABS (pertinent positives/negatives)  Labs Reviewed  CBC - Abnormal; Notable for the following:    WBC 11.1 (*)    All other components within normal limits  BASIC METABOLIC PANEL - Abnormal; Notable for the following:    Potassium 3.4 (*)    All other components within normal limits  URINALYSIS COMPLETEWITH MICROSCOPIC ([facility])  - Abnormal; Notable for the following:    Color, Urine STRAW (*)    APPearance CLEAR (*)    Hgb urine dipstick 1+ (*)    Squamous Epithelial / LPF 0-5 (*)    All other components within normal  limits  TROPONIN I    EKG: Normal sinus rhythm with normal axis normal intervals no evidence of hypertrophy or acute infarction ____________________________________________    RADIOLOGY  None  ____________________________________________    ED COURSE  Pertinent labs & imaging results that were available during my care of the patient were reviewed by me and considered in my medical decision making (see chart for details).  The patient later fluid and have her eat  something here, then we will rewrite reevaluate  FINAL ASSESSMENT AND PLAN  Assessment: syncope Plan: Syncope is likely multifactorial. The patient has a history of anxiety and has had recent illness with diarrhea with possible dehydration. Patient given normal saline here and is eating well and is not orthostatic stable for outpatient follow-up    Earleen Newport, MD   Earleen Newport, MD 04/01/15 [id number]

## 2015-04-01 NOTE — ED Notes (Signed)
States awoke on floor and felt disoriented for approx 1 hour, called brother to bring her here

## 2015-04-01 NOTE — Discharge Instructions (Signed)
Syncope °Syncope means a person passes out (faints). The person usually wakes up in less than 5 minutes. It is important to seek medical care for syncope. °HOME CARE °· Have someone stay with you until you feel normal. °· Do not drive, use machines, or play sports until your doctor says it is okay. °· Keep all doctor visits as told. °· Lie down when you feel like you might pass out. Take deep breaths. Wait until you feel normal before standing up. °· Drink enough fluids to keep your pee (urine) clear or pale yellow. °· If you take blood pressure or heart medicine, get up slowly. Take several minutes to sit and then stand. °GET HELP RIGHT AWAY IF:  °· You have a severe headache. °· You have pain in the chest, belly (abdomen), or back. °· You are bleeding from the mouth or butt (rectum). °· You have black or tarry poop (stool). °· You have an irregular or very fast heartbeat. °· You have pain with breathing. °· You keep passing out, or you have shaking (seizures) when you pass out. °· You pass out when sitting or lying down. °· You feel confused. °· You have trouble walking. °· You have severe weakness. °· You have vision problems. °If you fainted, call your local emergency services (911 in [place] Do not drive yourself to the hospital. °MAKE SURE YOU:  °· Understand these instructions. °· Will watch your condition. °· Will get help right away if you are not doing well or get worse. °Document Released: 04/27/2008 Document Revised: 05/10/2012 Document Reviewed: 01/08/2012 °Exit[employer]® Patient Information ©2015 [employer]. This information is not intended to replace advice given to you by your health care provider. Make sure you discuss any questions you have with your health care provider. ° °

## 2015-04-02 ENCOUNTER — Telehealth: Payer: Self-pay | Admitting: *Deleted

## 2015-04-02 NOTE — Telephone Encounter (Signed)
Dr. Glori Bickers sent me a message saying "Pt was seen in ER for syncope - please check in to see how she is feeling and schedule a f/u appt if she is not better"  Called pt and no answer so left voicemail requesting pt to call and update [place] on how she is feeling and if she isn't feeling better to schedule a f/u with Dr. Glori Bickers

## 2016-01-15 ENCOUNTER — Encounter: Payer: Self-pay | Admitting: Family Medicine

## 2016-01-15 ENCOUNTER — Ambulatory Visit (INDEPENDENT_AMBULATORY_CARE_PROVIDER_SITE_OTHER): Payer: BLUE CROSS/BLUE SHIELD | Admitting: Family Medicine

## 2016-01-15 VITALS — BP 100/70 | HR 94 | Temp 98.0°F | Wt 131.0 lb

## 2016-01-15 DIAGNOSIS — J069 Acute upper respiratory infection, unspecified: Secondary | ICD-10-CM | POA: Insufficient documentation

## 2016-01-15 DIAGNOSIS — M545 Low back pain: Secondary | ICD-10-CM | POA: Diagnosis not present

## 2016-01-15 DIAGNOSIS — B9789 Other viral agents as the cause of diseases classified elsewhere: Principal | ICD-10-CM

## 2016-01-15 LAB — POCT INFLUENZA A/B
INFLUENZA A, POC: NEGATIVE
INFLUENZA B, POC: NEGATIVE

## 2016-01-15 LAB — POCT URINALYSIS DIPSTICK
Bilirubin, UA: NEGATIVE
Blood, UA: NEGATIVE
Glucose, UA: NEGATIVE
Ketones, UA: NEGATIVE
LEUKOCYTES UA: NEGATIVE
Nitrite, UA: NEGATIVE
PROTEIN UA: NEGATIVE
Spec Grav, UA: >=1.03
UROBILINOGEN UA: 0.2
pH, UA: 6

## 2016-01-15 MED ORDER — BENZONATATE 200 MG PO CAPS: 200.0000 mg | ORAL_CAPSULE | Freq: Three times a day (TID) | ORAL | Status: DC | PRN

## 2016-01-15 NOTE — Progress Notes (Signed)
Pre visit review using our clinic review tool, if applicable. No additional management support is needed unless otherwise documented below in the visit note. 

## 2016-01-15 NOTE — Patient Instructions (Addendum)
I think you have one of the flu like illnesses going around Drink lots of fluids and rest  Your flu swab is negative  Take 2 aleve with meal twice daily (every 12 hours)  Tylenol (acetaminophen) 2 reg strength pills up to every 4 hours  For nasal symptoms - zyrtec 10 mg once daily  Breathe steam/ try nasal saline spray Tessalon for cough - sent to [facility]  Update if not starting to improve in a week or if worsening

## 2016-01-15 NOTE — Progress Notes (Signed)
Subjective:    Patient ID: Pamela Calhoun, female    DOB: Oct 20, 1986, 30 y.o.   MRN: [id number]  HPI Here for malaise and body aches   Monday- L side of back was severe  Also bladder spasms  Yesterday woke up and whole body aches  Some cough - not bad -usually clear drainage  Runny nose and stuffy nose  Had chills Temp of 101   Results for orders placed or performed in visit on 01/15/16  POCT urinalysis dipstick  Result Value Ref Range   Color, UA yellow    Clarity, UA clear    Glucose, UA neg    Bilirubin, UA neg    Ketones, UA neg    Spec Grav, UA >=1.030    Blood, UA neg    pH, UA 6.0    Protein, UA neg    Urobilinogen, UA 0.2    Nitrite, UA neg    Leukocytes, UA Negative Negative     Lost her job in May Was going to the gym  New job in Aug- works at [employer]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   Patient Active Problem List   Diagnosis Date Noted  . Vaginal discharge 01/23/2015  . Dysuria 01/23/2015  . Screen for STD (sexually transmitted disease) 01/23/2015  . Acute bronchitis 12/24/2014  . Plantar fasciitis 10/07/2014  . MRSA (methicillin resistant Staphylococcus aureus) carrier 04/12/2014  . ALLERGIC RHINITIS 03/22/2007  . SKIN CANCER, HX OF 03/22/2007   Past Medical History  Diagnosis Date  . Allergy   . Cancer ([place])     skin- hystory of melanoma   . Depression   . Ureteral reflux     as a child   . PID (acute pelvic inflammatory disease)   . Endometriosis   . Interstitial cystitis   . Frequent UTI   . History of shingles   . Pap smear, abnormal     cryosx   . Fracture     right foot   Past Surgical History  Procedure Laterality Date  . Tonsillectomy    . Melanoma excision      Right leg   . Laparoscopy      pelvic    Social History  Substance Use Topics  . Smoking status: Former Research scientist (life sciences)  . Smokeless tobacco: Former Systems developer     Comment: quit 2 years ago  . Alcohol Use: 0.6 oz/week    1 Standard drinks or equivalent  per week     Comment: occassionally   Family History  Problem Relation Age of Onset  . Cancer Maternal Grandmother     breast   Allergies  Allergen Reactions  . Cetirizine Hcl     REACTION: diarrhea  . Paroxetine     REACTION: nausea  . Vicodin [Hydrocodone-Acetaminophen] Itching   Current Outpatient Prescriptions on File Prior to Visit  Medication Sig Dispense Refill  . ALPRAZolam (XANAX) 0.5 MG tablet Take 0.5 mg by mouth 2 (two) times daily as needed (bladder spazms).     . hydrOXYzine (VISTARIL) 25 MG capsule Take 25 mg by mouth at bedtime.     . Multiple Vitamin (MULTIVITAMIN WITH MINERALS) TABS tablet Take 1 tablet by mouth daily.    . pentosan polysulfate (ELMIRON) 100 MG capsule Take 200 mg by mouth at bedtime. Take 2 by mouth at bedtime    . Levonorgest-Eth Estrad 91-Day 0.15-0.03 &0.01 MG TABS Take 1 tablet by mouth daily.  Current Facility-Administered Medications on File Prior to Visit  Medication Dose Route Frequency Provider Last Rate Last Dose  . triamcinolone acetonide (KENALOG) 10 MG/ML injection 10 mg  10 mg Other Once Trula Slade, DPM          Review of Systems  Constitutional: Positive for chills, appetite change and fatigue. Negative for fever.       Body aches and malaise   HENT: Positive for congestion, postnasal drip, rhinorrhea, sinus pressure, sneezing and sore throat. Negative for ear pain.   Eyes: Negative for pain and discharge.  Respiratory: Positive for cough. Negative for shortness of breath, wheezing and stridor.   Cardiovascular: Negative for chest pain.  Gastrointestinal: Negative for nausea, vomiting and diarrhea.  Genitourinary: Negative for urgency, frequency and hematuria.  Musculoskeletal: Negative for myalgias and arthralgias.  Skin: Negative for rash.  Neurological: Positive for headaches. Negative for dizziness, weakness and light-headedness.  Psychiatric/Behavioral: Negative for confusion and dysphoric mood.         Objective:   Physical Exam  Constitutional: She appears well-developed and well-nourished. No distress.  Fatigued appearing  Feels warm to the touch   HENT:  Head: Normocephalic and atraumatic.  Right Ear: External ear normal.  Left Ear: External ear normal.  Mouth/Throat: Oropharynx is clear and moist.  Nares are injected and congested  No sinus tenderness Clear rhinorrhea and post nasal drip   Eyes: Conjunctivae and EOM are normal. Pupils are equal, round, and reactive to light. Right eye exhibits no discharge. Left eye exhibits no discharge.  Neck: Normal range of motion. Neck supple.  Cardiovascular: Normal rate and normal heart sounds.   Pulmonary/Chest: Effort normal and breath sounds normal. No respiratory distress. She has no wheezes. She has no rales. She exhibits no tenderness.  Abdominal: There is tenderness in the suprapubic area. There is CVA tenderness.  Musculoskeletal: She exhibits tenderness. She exhibits no edema.  cva tenderness bilat   Lymphadenopathy:    She has no cervical adenopathy.  Neurological: She is alert.  Skin: Skin is warm and dry. No rash noted.  Psychiatric: She has a normal mood and affect.          Assessment & Plan:   Problem List Items Addressed This Visit      Respiratory   Viral URI with cough - Primary    Has had fever and body aches Neg flu swab and neg ua   I think you have one of the flu like illnesses going around Drink lots of fluids and rest  Your flu swab is negative  Take 2 aleve with meal twice daily (every 12 hours)  Tylenol (acetaminophen) 2 reg strength pills up to every 4 hours  For nasal symptoms - zyrtec 10 mg once daily  Breathe steam/ try nasal saline spray Tessalon for cough - sent to [facility]  Update if not starting to improve in a week or if worsening        Relevant Orders   Influenza A/B (Completed)    Other Visit Diagnoses    Left low back pain, with sciatica presence unspecified        Relevant  Orders    POCT urinalysis dipstick (Completed)

## 2016-01-16 NOTE — Assessment & Plan Note (Signed)
Has had fever and body aches Neg flu swab and neg ua   I think you have one of the flu like illnesses going around Drink lots of fluids and rest  Your flu swab is negative  Take 2 aleve with meal twice daily (every 12 hours)  Tylenol (acetaminophen) 2 reg strength pills up to every 4 hours  For nasal symptoms - zyrtec 10 mg once daily  Breathe steam/ try nasal saline spray Tessalon for cough - sent to [facility]  Update if not starting to improve in a week or if worsening

## 2016-01-23 ENCOUNTER — Other Ambulatory Visit: Payer: Self-pay | Admitting: General Surgery

## 2016-01-23 NOTE — H&P (Signed)
History of Present Illness Pamela Ruff MD; 99991111 11:29 AM) Patient words: NP.  The patient is a 30 year old female who presents with hemorrhoids. 30 year old female referred to me by Dr. Helane Rima for hemorrhoid disease. She states she has prolapsing tissue that occurs with bowel movements and has to be reduced manually. She has occasional bleeding. She does have a history of constipation but is working on her fiber intake and fluid intake to help resolve this.   Other Problems Yehuda Mao, RMA; 01/23/2016 11:08 AM) Anxiety Disorder Back Pain Bladder Problems Hemorrhoids Melanoma Other disease, cancer, significant illness  Past Surgical History Yehuda Mao, RMA; 01/23/2016 11:08 AM) Foot Surgery Right. Oral Surgery Tonsillectomy  Diagnostic Studies History Yehuda Mao, RMA; 01/23/2016 11:08 AM) Colonoscopy never Mammogram never Pap Smear 1-5 years ago  Allergies Shirlean Mylar Gwynn, RMA; 01/23/2016 11:09 AM) Codeine Sulfate *ANALGESICS - OPIOID* ZyrTEC Allergy Childrens *ANTIHISTAMINES* PARoxetine HCl ER *ANTIDEPRESSANTS*  Medication History (Robin Gwynn, RMA; 01/23/2016 11:10 AM) ALPRAZolam (0.5MG  Tablet, Oral) Active. Elmiron (100MG  Capsule, Oral) Active. Levonorgest-Eth Estrad 91-Day (0.15-0.03 &0.01MG  Tablet, Oral) Active. Multi-Minerals (Oral) Active. Medications Reconciled  Social History Yehuda Mao, RMA; 01/23/2016 11:08 AM) Alcohol use Occasional alcohol use. Caffeine use Carbonated beverages, Coffee, Tea. Illicit drug use Remotely quit drug use. Tobacco use Former smoker.  Family History Yehuda Mao, RMA; 01/23/2016 11:08 AM) Diabetes Mellitus Mother. Heart Disease Mother. Heart disease in female family member before age 76 Hypertension Mother. Migraine Headache Mother.  Pregnancy / Birth History Yehuda Mao, RMA; 01/23/2016 11:08 AM) Age at menarche 76 years. Contraceptive History Oral contraceptives. Gravida 2 Maternal age  74-20 Para 0 Regular periods     Review of Systems ([place] RMA; 01/23/2016 11:08 AM) General Present- Weight Loss. Not Present- Appetite Loss, Chills, Fatigue, Fever, Night Sweats and Weight Gain. Skin Not Present- Change in Wart/Mole, Dryness, Hives, Jaundice, New Lesions, Non-Healing Wounds, Rash and Ulcer. HEENT Present- Seasonal Allergies and Sinus Pain. Not Present- Earache, Hearing Loss, Hoarseness, Nose Bleed, Oral Ulcers, Ringing in the Ears, Sore Throat, Visual Disturbances, Wears glasses/contact lenses and Yellow Eyes. Breast Not Present- Breast Mass, Breast Pain, Nipple Discharge and Skin Changes. Gastrointestinal Present- Abdominal Pain, Change in Bowel Habits, Hemorrhoids and Rectal Pain. Not Present- Bloating, Bloody Stool, Chronic diarrhea, Constipation, Difficulty Swallowing, Excessive gas, Gets full quickly at meals, Indigestion, Nausea and Vomiting. Female Genitourinary Present- Frequency and Pelvic Pain. Not Present- Nocturia, Painful Urination and Urgency. Musculoskeletal Present- Back Pain. Not Present- Joint Pain, Joint Stiffness, Muscle Pain, Muscle Weakness and Swelling of Extremities. Neurological Not Present- Decreased Memory, Fainting, Headaches, Numbness, Seizures, Tingling, Tremor, Trouble walking and Weakness. Psychiatric Not Present- Anxiety, Bipolar, Change in Sleep Pattern, Depression, Fearful and Frequent crying. Endocrine Not Present- Cold Intolerance, Excessive Hunger, Hair Changes, Heat Intolerance, Hot flashes and New Diabetes.  Vitals (Robin Gwynn RMA; 01/23/2016 11:11 AM) 01/23/2016 11:10 AM Weight: 134 lb Height: 70in Body Surface Area: 1.76 m Body Mass Index: 19.23 kg/m  Temp.: 97.64F  Pulse: 86 (Regular)  BP: 108/70 (Sitting, Left Arm, Standard)      Physical Exam Pamela Ruff MD; [id number] 11:32 AM)  General Mental Status-Alert. General Appearance-Not in acute distress. Build & Nutrition-Well  nourished. Posture-Normal posture. Gait-Normal.  Head and Neck Head-normocephalic, atraumatic with no lesions or palpable masses. Trachea-midline.  Chest and Lung Exam Chest and lung exam reveals -on auscultation, normal breath sounds, no adventitious sounds and normal vocal resonance.  Cardiovascular Cardiovascular examination reveals -normal heart sounds, regular rate and rhythm with no murmurs.  Abdomen  Inspection Inspection of the abdomen reveals - No Hernias. Palpation/Percussion Palpation and Percussion of the abdomen reveal - Soft, Non Tender, No Rigidity (guarding), No hepatosplenomegaly and No Palpable abdominal masses.  Rectal Anorectal Exam External - Note: Small anterior skin tag with a small amount of mucosal prolapse, anterior hypertrophied papilla noted internally. Internal - normal sphincter tone.  Neurologic Neurologic evaluation reveals -alert and oriented x 3 with no impairment of recent or remote memory, normal attention span and ability to concentrate, normal sensation and normal coordination.  Musculoskeletal Normal Exam - Bilateral-Upper Extremity Strength Normal and Lower Extremity Strength Normal.   Results Pamela Ruff MD; 99991111 11:33 AM) Procedures  Name Value Date ANOSCOPY, DIAGNOSTIC [id number]) [ Hemorrhoids ] Procedure Other: Procedure: Anoscopy Surgeon: Marcello Moores After the risks and benefits were explained, verbal consent was obtained for above procedure. A medical assistant chaperone was present thoroughout the entire procedure. Anesthesia: none Diagnosis: Prolapsing rectal tissue Findings: Large hypertrophied anal papilla anteriorly, no hemorrhoid disease noted internally, small anterior skin tag noted     Assessment & Plan Pamela Ruff MD; 99991111 11:32 AM)  Pamela Calhoun ANAL PAPILLA (K62.89) Impression: 30 year old female with a hypertrophied anal papilla in the anterior anal canal that is causing her pain and  discomfort. It is prolapsing with bowel movements and she has to reduce this manually. I recommended excision. She has a mild anterior skin tag as well that can be removed at the same time. She is agreement of this and understands that this will slightly increase her postoperative pain. Other risk of the surgery are bleeding, infection and possible recurrence. I believe she understands this and has agreed to proceed with surgery.

## 2016-01-28 ENCOUNTER — Encounter: Payer: Self-pay | Admitting: Family Medicine

## 2016-01-28 ENCOUNTER — Ambulatory Visit (INDEPENDENT_AMBULATORY_CARE_PROVIDER_SITE_OTHER): Payer: BLUE CROSS/BLUE SHIELD | Admitting: Family Medicine

## 2016-01-28 ENCOUNTER — Ambulatory Visit: Payer: BLUE CROSS/BLUE SHIELD | Admitting: Primary Care

## 2016-01-28 VITALS — BP 100/60 | HR 93 | Temp 98.1°F | Ht 70.0 in | Wt 134.5 lb

## 2016-01-28 DIAGNOSIS — B9789 Other viral agents as the cause of diseases classified elsewhere: Secondary | ICD-10-CM

## 2016-01-28 DIAGNOSIS — B349 Viral infection, unspecified: Secondary | ICD-10-CM | POA: Diagnosis not present

## 2016-01-28 DIAGNOSIS — R509 Fever, unspecified: Secondary | ICD-10-CM | POA: Diagnosis not present

## 2016-01-28 DIAGNOSIS — J329 Chronic sinusitis, unspecified: Secondary | ICD-10-CM | POA: Diagnosis not present

## 2016-01-28 LAB — POCT INFLUENZA A/B
INFLUENZA B, POC: NEGATIVE
Influenza A, POC: NEGATIVE

## 2016-01-28 MED ORDER — PREDNISONE 10 MG PO TABS: ORAL_TABLET | ORAL | Status: DC

## 2016-01-28 MED ORDER — AMOXICILLIN 500 MG PO CAPS: 1000.0000 mg | ORAL_CAPSULE | Freq: Two times a day (BID) | ORAL | Status: DC

## 2016-01-28 NOTE — Progress Notes (Signed)
   Subjective:    Patient ID: Pamela Calhoun, female    DOB: Dec 15, 1985, 30 y.o.   MRN: [id number]  Sinusitis This is a new problem. The current episode started in the past 7 days (day 4, congestion for 1 week.). The problem has been rapidly worsening since onset. The maximum temperature recorded prior to her arrival was 101 - 101.9 F. Associated symptoms include congestion, coughing, ear pain, sinus pressure, sneezing and a sore throat. (Severe facial pain  change of mucus to green color  left ear pain  body ache) Treatments tried: advil sinus, allegra. The treatment provided mild relief.   Work contacts with the flu.  Social History /Family History/Past Medical History reviewed and updated if needed. Hx of sinus infection, recurrent, bout not in a while  allergies  former smoker  Review of Systems  HENT: Positive for congestion, ear pain, sinus pressure, sneezing and sore throat.   Respiratory: Positive for cough.        Objective:   Physical Exam  Constitutional: Vital signs are normal. She appears well-developed and well-nourished. She is cooperative.  Non-toxic appearance. She does not appear ill. No distress.  HENT:  Head: Normocephalic.  Right Ear: Hearing, tympanic membrane, external ear and ear canal normal. Tympanic membrane is not erythematous, not retracted and not bulging. No middle ear effusion.  Left Ear: Hearing, tympanic membrane, external ear and ear canal normal. Tympanic membrane is not erythematous, not retracted and not bulging.  No middle ear effusion.  Nose: Mucosal edema and rhinorrhea present. Right sinus exhibits maxillary sinus tenderness and frontal sinus tenderness. Left sinus exhibits maxillary sinus tenderness. Left sinus exhibits no frontal sinus tenderness.  Mouth/Throat: Uvula is midline and mucous membranes are normal. Posterior oropharyngeal erythema present. No oropharyngeal exudate, posterior oropharyngeal edema or tonsillar abscesses.  Eyes:  Conjunctivae, EOM and lids are normal. Pupils are equal, round, and reactive to light. Lids are everted and swept, no foreign bodies found.  Neck: Trachea normal and normal range of motion. Neck supple. Carotid bruit is not present. No thyroid mass and no thyromegaly present.  Cardiovascular: Normal rate, regular rhythm, S1 normal, S2 normal, normal heart sounds, intact distal pulses and normal pulses.  Exam reveals no gallop and no friction rub.   No murmur heard. Pulmonary/Chest: Effort normal and breath sounds normal. No tachypnea. No respiratory distress. She has no decreased breath sounds. She has no wheezes. She has no rhonchi. She has no rales.  Neurological: She is alert.  Skin: Skin is warm, dry and intact. No rash noted.  Psychiatric: Her speech is normal and behavior is normal. Judgment normal. Her mood appears not anxious. Cognition and memory are normal. She does not exhibit a depressed mood.          Assessment & Plan:

## 2016-01-28 NOTE — Progress Notes (Signed)
Pre visit review using our clinic review tool, if applicable. No additional management support is needed unless otherwise documented below in the visit note. 

## 2016-01-28 NOTE — Addendum Note (Signed)
Addended by: Eliezer Lofts E on: 01/28/2016 04:20 PM   Modules accepted: Orders

## 2016-01-28 NOTE — Patient Instructions (Signed)
Flu test negative.  Push fluids. Rest. Start prednisone taper for sinus pressure and pain.  Start nasal saline irrigation or spray 2-3 times daily.  Continue allegra. Start nasal fonase 2 sprays per nostril daily.  Call if not improving in next 4-5 days.

## 2016-01-28 NOTE — Assessment & Plan Note (Addendum)
Flu test negative.  Push fluids. Rest. Start prednisone taper for sinus pressure and pain.  Start nasal saline irrigation or spray 2-3 times daily.  Continue allegra. Start nasal fonase 2 sprays per nostril daily.  Call if not improving in next 4-5 days.  Addendum: pot unhappy with not having an antibiotics given. Discussed antibiotics resistance issues. Rx given to fill for amox 10 days if not improving like expected.

## 2016-01-29 ENCOUNTER — Ambulatory Visit: Payer: BLUE CROSS/BLUE SHIELD | Admitting: Primary Care

## 2016-02-04 ENCOUNTER — Encounter (HOSPITAL_BASED_OUTPATIENT_CLINIC_OR_DEPARTMENT_OTHER): Payer: Self-pay | Admitting: *Deleted

## 2016-02-04 NOTE — Progress Notes (Signed)
Pt instructed npo pmn 3/20 x elmiron, xanax, vistaril w sip of water.  To [facility] 3/21 @ 0600.  Needs hgb, urine hcg on arrival.

## 2016-02-10 NOTE — Anesthesia Preprocedure Evaluation (Addendum)
Anesthesia Evaluation  Patient identified by MRN, date of birth, ID band Patient awake    Reviewed: Allergy & Precautions, NPO status , Patient's Chart, lab work & pertinent test results  History of Anesthesia Complications Negative for: history of anesthetic complications  Airway Mallampati: II  TM Distance: >3 FB Neck ROM: Full    Dental no notable dental hx. (+) Dental Advisory Given, Teeth Intact   Pulmonary former smoker,    Pulmonary exam normal breath sounds clear to auscultation       Cardiovascular Exercise Tolerance: Good negative cardio ROS Normal cardiovascular exam Rhythm:Regular Rate:Normal     Neuro/Psych PSYCHIATRIC DISORDERS Anxiety Depression negative neurological ROS     GI/Hepatic negative GI ROS, Neg liver ROS,   Endo/Other  negative endocrine ROS  Renal/GU negative Renal ROS  negative genitourinary   Musculoskeletal negative musculoskeletal ROS (+)   Abdominal   Peds negative pediatric ROS (+)  Hematology negative hematology ROS (+)   Anesthesia Other Findings   Reproductive/Obstetrics negative OB ROS                          Anesthesia Physical Anesthesia Plan  ASA: II  Anesthesia Plan: MAC   Post-op Pain Management:    Induction: Intravenous  Airway Management Planned: Nasal Cannula  Additional Equipment:   Intra-op Plan:   Post-operative Plan:   Informed Consent: I have reviewed the patients History and Physical, chart, labs and discussed the procedure including the risks, benefits and alternatives for the proposed anesthesia with the patient or authorized representative who has indicated his/her understanding and acceptance.   Dental advisory given  Plan Discussed with: CRNA  Anesthesia Plan Comments:         Anesthesia Quick Evaluation

## 2016-02-11 ENCOUNTER — Encounter (HOSPITAL_BASED_OUTPATIENT_CLINIC_OR_DEPARTMENT_OTHER)
Admission: RE | Disposition: A | Discharge status: Home / Self Care | Payer: Self-pay | Source: Ambulatory Visit | Attending: General Surgery

## 2016-02-11 ENCOUNTER — Ambulatory Visit (HOSPITAL_BASED_OUTPATIENT_CLINIC_OR_DEPARTMENT_OTHER): Payer: BLUE CROSS/BLUE SHIELD | Admitting: Anesthesiology

## 2016-02-11 ENCOUNTER — Encounter (HOSPITAL_BASED_OUTPATIENT_CLINIC_OR_DEPARTMENT_OTHER): Payer: Self-pay | Admitting: *Deleted

## 2016-02-11 ENCOUNTER — Ambulatory Visit (HOSPITAL_BASED_OUTPATIENT_CLINIC_OR_DEPARTMENT_OTHER)
Admission: RE | Admit: 2016-02-11 | Discharge: 2016-02-11 | Disposition: A | Discharge status: Home / Self Care | Payer: BLUE CROSS/BLUE SHIELD | Source: Ambulatory Visit | Attending: General Surgery | Admitting: General Surgery

## 2016-02-11 DIAGNOSIS — K620 Anal polyp: Secondary | ICD-10-CM | POA: Insufficient documentation

## 2016-02-11 DIAGNOSIS — Z85820 Personal history of malignant melanoma of skin: Secondary | ICD-10-CM | POA: Insufficient documentation

## 2016-02-11 DIAGNOSIS — F419 Anxiety disorder, unspecified: Secondary | ICD-10-CM | POA: Diagnosis not present

## 2016-02-11 DIAGNOSIS — Z79899 Other long term (current) drug therapy: Secondary | ICD-10-CM | POA: Insufficient documentation

## 2016-02-11 DIAGNOSIS — K644 Residual hemorrhoidal skin tags: Secondary | ICD-10-CM | POA: Diagnosis present

## 2016-02-11 DIAGNOSIS — Z87891 Personal history of nicotine dependence: Secondary | ICD-10-CM | POA: Diagnosis not present

## 2016-02-11 DIAGNOSIS — Z793 Long term (current) use of hormonal contraceptives: Secondary | ICD-10-CM | POA: Insufficient documentation

## 2016-02-11 HISTORY — PX: EXCISION OF SKIN TAG: SHX6270

## 2016-02-11 LAB — POCT PREGNANCY, URINE: Preg Test, Ur: NEGATIVE

## 2016-02-11 LAB — POCT HEMOGLOBIN-HEMACUE: HEMOGLOBIN: 13.2 g/dL (ref 12.0–15.0)

## 2016-02-11 SURGERY — EXCISION, SKIN TAG
Anesthesia: Monitor Anesthesia Care | Site: Rectum

## 2016-02-11 MED ORDER — PROPOFOL 10 MG/ML IV BOLUS
INTRAVENOUS | Status: AC
  Filled 2016-02-11: qty 40

## 2016-02-11 MED ORDER — LIDOCAINE HCL (CARDIAC) 20 MG/ML IV SOLN
INTRAVENOUS | Status: DC | PRN
  Administered 2016-02-11: 50 mg via INTRAVENOUS

## 2016-02-11 MED ORDER — SODIUM CHLORIDE 0.9% FLUSH
3.0000 mL | Freq: Two times a day (BID) | INTRAVENOUS | Status: DC
  Filled 2016-02-11: qty 3

## 2016-02-11 MED ORDER — MIDAZOLAM HCL 5 MG/5ML IJ SOLN
INTRAMUSCULAR | Status: DC | PRN
  Administered 2016-02-11: 2 mg via INTRAVENOUS

## 2016-02-11 MED ORDER — ONDANSETRON HCL 4 MG/2ML IJ SOLN
INTRAMUSCULAR | Status: AC
  Filled 2016-02-11: qty 2

## 2016-02-11 MED ORDER — ACETAMINOPHEN 325 MG PO TABS
650.0000 mg | ORAL_TABLET | ORAL | Status: DC | PRN
  Filled 2016-02-11: qty 2

## 2016-02-11 MED ORDER — FENTANYL CITRATE (PF) 100 MCG/2ML IJ SOLN
INTRAMUSCULAR | Status: DC | PRN
  Administered 2016-02-11 (×2): 50 ug via INTRAVENOUS

## 2016-02-11 MED ORDER — ONDANSETRON HCL 4 MG/2ML IJ SOLN
4.0000 mg | Freq: Once | INTRAMUSCULAR | Status: DC | PRN
  Filled 2016-02-11: qty 2

## 2016-02-11 MED ORDER — ACETAMINOPHEN 650 MG RE SUPP
650.0000 mg | RECTAL | Status: DC | PRN
  Filled 2016-02-11: qty 1

## 2016-02-11 MED ORDER — PROPOFOL 500 MG/50ML IV EMUL
INTRAVENOUS | Status: DC | PRN
  Administered 2016-02-11: 200 ug/kg/min via INTRAVENOUS

## 2016-02-11 MED ORDER — OXYCODONE HCL 5 MG PO TABS
5.0000 mg | ORAL_TABLET | ORAL | Status: DC | PRN
  Administered 2016-02-11: 5 mg via ORAL
  Filled 2016-02-11: qty 2

## 2016-02-11 MED ORDER — LACTATED RINGERS IV SOLN
INTRAVENOUS | Status: DC
  Administered 2016-02-11: 07:00:00 via INTRAVENOUS
  Filled 2016-02-11: qty 1000

## 2016-02-11 MED ORDER — KETAMINE HCL 10 MG/ML IJ SOLN
INTRAMUSCULAR | Status: AC
  Filled 2016-02-11: qty 1

## 2016-02-11 MED ORDER — SODIUM CHLORIDE 0.9 % IV SOLN
250.0000 mL | INTRAVENOUS | Status: DC | PRN
  Filled 2016-02-11: qty 250

## 2016-02-11 MED ORDER — FENTANYL CITRATE (PF) 100 MCG/2ML IJ SOLN
INTRAMUSCULAR | Status: AC
  Filled 2016-02-11: qty 2

## 2016-02-11 MED ORDER — SODIUM CHLORIDE 0.9 % IV SOLN
250.0000 mg | INTRAVENOUS | Status: DC | PRN
  Administered 2016-02-11 (×2): 10 ug/kg/min via INTRAVENOUS

## 2016-02-11 MED ORDER — ONDANSETRON HCL 4 MG/2ML IJ SOLN
INTRAMUSCULAR | Status: DC | PRN
  Administered 2016-02-11: 4 mg via INTRAVENOUS

## 2016-02-11 MED ORDER — SODIUM CHLORIDE 0.9 % IR SOLN
Status: DC | PRN
  Administered 2016-02-11: 500 mL

## 2016-02-11 MED ORDER — MIDAZOLAM HCL 2 MG/2ML IJ SOLN
INTRAMUSCULAR | Status: AC
  Filled 2016-02-11: qty 2

## 2016-02-11 MED ORDER — FENTANYL CITRATE (PF) 100 MCG/2ML IJ SOLN
25.0000 ug | INTRAMUSCULAR | Status: DC | PRN
  Filled 2016-02-11: qty 1

## 2016-02-11 MED ORDER — OXYCODONE HCL 5 MG PO TABS
ORAL_TABLET | ORAL | Status: AC
  Filled 2016-02-11: qty 1

## 2016-02-11 MED ORDER — SODIUM CHLORIDE 0.9% FLUSH
3.0000 mL | INTRAVENOUS | Status: DC | PRN
  Filled 2016-02-11: qty 3

## 2016-02-11 MED ORDER — OXYCODONE HCL 5 MG PO TABS: 5.0000 mg | ORAL_TABLET | ORAL | Status: DC | PRN

## 2016-02-11 SURGICAL SUPPLY — 36 items
BENZOIN TINCTURE PRP APPL 2/3 (GAUZE/BANDAGES/DRESSINGS) ×2 IMPLANT
BLADE SURG 15 STRL LF DISP TIS (BLADE) ×1 IMPLANT
BLADE SURG 15 STRL SS (BLADE) ×1
BRIEF STRETCH FOR OB PAD LRG (UNDERPADS AND DIAPERS) ×2 IMPLANT
COVER BACK TABLE 60X90IN (DRAPES) ×2 IMPLANT
COVER MAYO STAND STRL (DRAPES) ×2 IMPLANT
DRAPE LAPAROTOMY 100X72 PEDS (DRAPES) ×2 IMPLANT
DRAPE UTILITY XL STRL (DRAPES) ×2 IMPLANT
DRSG PAD ABDOMINAL 8X10 ST (GAUZE/BANDAGES/DRESSINGS) ×2 IMPLANT
ELECT REM PT RETURN 9FT ADLT (ELECTROSURGICAL) ×2
ELECTRODE REM PT RTRN 9FT ADLT (ELECTROSURGICAL) ×1 IMPLANT
GLOVE BIO SURGEON STRL SZ 6.5 (GLOVE) ×6 IMPLANT
GLOVE INDICATOR 7.0 STRL GRN (GLOVE) ×4 IMPLANT
GOWN STRL REUS W/ TWL XL LVL3 (GOWN DISPOSABLE) ×2 IMPLANT
GOWN STRL REUS W/TWL 2XL LVL3 (GOWN DISPOSABLE) ×2 IMPLANT
GOWN STRL REUS W/TWL XL LVL3 (GOWN DISPOSABLE) ×2
KIT ROOM TURNOVER WOR (KITS) ×2 IMPLANT
NEEDLE HYPO 25X1 1.5 SAFETY (NEEDLE) ×2 IMPLANT
PAD ARMBOARD 7.5X6 YLW CONV (MISCELLANEOUS) ×2 IMPLANT
PENCIL BUTTON HOLSTER BLD 10FT (ELECTRODE) ×2 IMPLANT
SPONGE GAUZE 4X4 12PLY (GAUZE/BANDAGES/DRESSINGS) IMPLANT
SPONGE GAUZE 4X4 12PLY STER LF (GAUZE/BANDAGES/DRESSINGS) ×2 IMPLANT
SUT CHROMIC 3 0 SH 27 (SUTURE) ×2 IMPLANT
SUT ETHILON 2 0 FS 18 (SUTURE) ×2 IMPLANT
SUT ETHILON 4 0 PS 2 18 (SUTURE) IMPLANT
SUT MNCRL AB 4-0 PS2 18 (SUTURE) IMPLANT
SUT SILK 2 0 SH (SUTURE) IMPLANT
SUT VIC AB 2-0 SH 27 (SUTURE)
SUT VIC AB 2-0 SH 27XBRD (SUTURE) IMPLANT
SUT VICRYL 4-0 PS2 18IN ABS (SUTURE) IMPLANT
SYR CONTROL 10ML LL (SYRINGE) ×2 IMPLANT
TOWEL OR 17X24 6PK STRL BLUE (TOWEL DISPOSABLE) ×2 IMPLANT
TRAY DSU PREP LF (CUSTOM PROCEDURE TRAY) ×2 IMPLANT
TUBE CONNECTING 12X1/4 (SUCTIONS) ×2 IMPLANT
UNDERPAD 30X30 INCONTINENT (UNDERPADS AND DIAPERS) ×2 IMPLANT
YANKAUER SUCT BULB TIP NO VENT (SUCTIONS) IMPLANT

## 2016-02-11 NOTE — H&P (Signed)
The patient is a 30 year old female who presents with hemorrhoids. 30 year old female referred to me by Dr. Helane Rima for hemorrhoid disease. She states she has prolapsing tissue that occurs with bowel movements and has to be reduced manually. She has occasional bleeding. She does have a history of constipation but is working on her fiber intake and fluid intake to help resolve this.   Other Problems Yehuda Mao, RMA; 01/23/2016 11:08 AM) Anxiety Disorder Back Pain Bladder Problems Hemorrhoids Melanoma Other disease, cancer, significant illness  Past Surgical History Yehuda Mao, RMA; 01/23/2016 11:08 AM) Foot Surgery Right. Oral Surgery Tonsillectomy  Diagnostic Studies History Yehuda Mao, RMA; 01/23/2016 11:08 AM) Colonoscopy never Mammogram never Pap Smear 1-5 years ago  Allergies Shirlean Mylar Gwynn, RMA; 01/23/2016 11:09 AM) Codeine Sulfate *ANALGESICS - OPIOID* ZyrTEC Allergy Childrens *ANTIHISTAMINES* PARoxetine HCl ER *ANTIDEPRESSANTS*  Medication History (Robin Gwynn, RMA; 01/23/2016 11:10 AM) ALPRAZolam (0.5MG  Tablet, Oral) Active. Elmiron (100MG  Capsule, Oral) Active. Levonorgest-Eth Estrad 91-Day (0.15-0.03 &0.01MG  Tablet, Oral) Active. Multi-Minerals (Oral) Active. Medications Reconciled  Social History Yehuda Mao, RMA; 01/23/2016 11:08 AM) Alcohol use Occasional alcohol use. Caffeine use Carbonated beverages, Coffee, Tea. Illicit drug use Remotely quit drug use. Tobacco use Former smoker.  Family History Yehuda Mao, RMA; 01/23/2016 11:08 AM) Diabetes Mellitus Mother. Heart Disease Mother. Heart disease in female family member before age 34 Hypertension Mother. Migraine Headache Mother.  Pregnancy / Birth History Yehuda Mao, RMA; 01/23/2016 11:08 AM) Age at menarche 19 years. Contraceptive History Oral contraceptives. Gravida 2 Maternal age 43-20 Para 0 Regular periods     Review of Systems ([place] RMA; 01/23/2016 11:08  AM) General Present- Weight Loss. Not Present- Appetite Loss, Chills, Fatigue, Fever, Night Sweats and Weight Gain. Skin Not Present- Change in Wart/Mole, Dryness, Hives, Jaundice, New Lesions, Non-Healing Wounds, Rash and Ulcer. HEENT Present- Seasonal Allergies and Sinus Pain. Not Present- Earache, Hearing Loss, Hoarseness, Nose Bleed, Oral Ulcers, Ringing in the Ears, Sore Throat, Visual Disturbances, Wears glasses/contact lenses and Yellow Eyes. Breast Not Present- Breast Mass, Breast Pain, Nipple Discharge and Skin Changes. Gastrointestinal Present- Abdominal Pain, Change in Bowel Habits, Hemorrhoids and Rectal Pain. Not Present- Bloating, Bloody Stool, Chronic diarrhea, Constipation, Difficulty Swallowing, Excessive gas, Gets full quickly at meals, Indigestion, Nausea and Vomiting. Female Genitourinary Present- Frequency and Pelvic Pain. Not Present- Nocturia, Painful Urination and Urgency. Musculoskeletal Present- Back Pain. Not Present- Joint Pain, Joint Stiffness, Muscle Pain, Muscle Weakness and Swelling of Extremities. Neurological Not Present- Decreased Memory, Fainting, Headaches, Numbness, Seizures, Tingling, Tremor, Trouble walking and Weakness. Psychiatric Not Present- Anxiety, Bipolar, Change in Sleep Pattern, Depression, Fearful and Frequent crying. Endocrine Not Present- Cold Intolerance, Excessive Hunger, Hair Changes, Heat Intolerance, Hot flashes and New Diabetes.  Vitals (Robin Gwynn RMA; 01/23/2016 11:11 AM) 01/23/2016 11:10 AM Weight: 134 lb Height: 70in Body Surface Area: 1.76 m Body Mass Index: 19.23 kg/m  Temp.: 97.84F  Pulse: 86 (Regular)  BP: 108/70 (Sitting, Left Arm, Standard)      Physical Exam Leighton Ruff MD; [id number] 11:32 AM)  General Mental Status-Alert. General Appearance-Not in acute distress. Build & Nutrition-Well nourished. Posture-Normal posture. Gait-Normal.  Head and Neck Head-normocephalic, atraumatic with no  lesions or palpable masses. Trachea-midline.  Chest and Lung Exam Chest and lung exam reveals -on auscultation, normal breath sounds, no adventitious sounds and normal vocal resonance.  Cardiovascular Cardiovascular examination reveals -normal heart sounds, regular rate and rhythm with no murmurs.  Abdomen Inspection Inspection of the abdomen reveals - No Hernias. Palpation/Percussion Palpation and Percussion of  the abdomen reveal - Soft, Non Tender, No Rigidity (guarding), No hepatosplenomegaly and No Palpable abdominal masses.  Rectal Anorectal Exam External - Note: Small anterior skin tag with a small amount of mucosal prolapse, anterior hypertrophied papilla noted internally. Internal - normal sphincter tone.  Neurologic Neurologic evaluation reveals -alert and oriented x 3 with no impairment of recent or remote memory, normal attention span and ability to concentrate, normal sensation and normal coordination.  Musculoskeletal Normal Exam - Bilateral-Upper Extremity Strength Normal and Lower Extremity Strength Normal.   Results Leighton Ruff MD; [id number] 11:33 AM) Procedures  Name Value Date ANOSCOPY, DIAGNOSTIC [id number]) [ Hemorrhoids ] Procedure Other: Procedure: Anoscopy Surgeon: Marcello Moores After the risks and benefits were explained, verbal consent was obtained for above procedure. A medical assistant chaperone was present thoroughout the entire procedure. Anesthesia: none Diagnosis: Prolapsing rectal tissue Findings: Large hypertrophied anal papilla anteriorly, no hemorrhoid disease noted internally, small anterior skin tag noted     Assessment & Plan Leighton Ruff MD; [id number] 11:32 AM)  Natalia Leatherwood ANAL PAPILLA (K62.89) Impression: 30 year old female with a hypertrophied anal papilla in the anterior anal canal that is causing her pain and discomfort. It is prolapsing with bowel movements and she has to reduce this manually. I recommended excision. She  has a mild anterior skin tag as well that can be removed at the same time. She is agreement of this and understands that this will slightly increase her postoperative pain. Other risk of the surgery are bleeding, infection and possible recurrence. I believe she understands this and has agreed to proceed with surgery.

## 2016-02-11 NOTE — Anesthesia Postprocedure Evaluation (Signed)
Anesthesia Post Note  Patient: Pamela Calhoun  Procedure(s) Performed: Procedure(s) (LRB): EXCISION OF ANAL SKIN TAG AN INTERNAL PAPILLA  (N/A)  Patient location during evaluation: PACU Anesthesia Type: MAC Level of consciousness: awake and alert Pain management: pain level controlled Vital Signs Assessment: post-procedure vital signs reviewed and stable Respiratory status: spontaneous breathing, nonlabored ventilation, respiratory function stable and patient connected to nasal cannula oxygen Cardiovascular status: blood pressure returned to baseline and stable Postop Assessment: no signs of nausea or vomiting Anesthetic complications: no    Last Vitals:  Filed Vitals:   02/11/16 0815 02/11/16 0830  BP: 117/55 118/62  Pulse: 69 70  Temp:    Resp: 20 19    Last Pain:  Filed Vitals:   02/11/16 0925  PainSc: 3                  Daisie Haft JENNETTE

## 2016-02-11 NOTE — Discharge Instructions (Addendum)

## 2016-02-11 NOTE — Transfer of Care (Signed)
Immediate Anesthesia Transfer of Care Note  Patient: Pamela Calhoun  Procedure(s) Performed: Procedure(s): EXCISION OF ANAL SKIN TAG AN INTERNAL PAPILLA  (N/A)  Patient Location: PACU  Anesthesia Type:MAC  Level of Consciousness: awake, alert , oriented and patient cooperative  Airway & Oxygen Therapy: Patient Spontanous Breathing and Patient connected to nasal cannula oxygen  Post-op Assessment: Report given to RN and Post -op Vital signs reviewed and stable  Post vital signs: Reviewed and stable BP 115/52 89 15 SaO2 100%  Last Vitals:  Filed Vitals:   02/11/16 0620  BP: 112/66  Pulse: 67  Temp: 36.4 C  Resp: 14    Complications: No apparent anesthesia complications

## 2016-02-11 NOTE — Op Note (Signed)
02/11/2016  7:58 AM  PATIENT:  Pamela Calhoun  30 y.o. female  Patient Care Team: Abner Greenspan, MD as PCP - General Dian Queen, MD as Consulting Physician (Obstetrics and Gynecology)  PRE-OPERATIVE DIAGNOSIS:  Hypertrophied anal papilla,anal skin tag  POST-OPERATIVE DIAGNOSIS:  Hypertrophied anal papilla,anal skin tag   PROCEDURE:  ANAL EXAM UNDER ANESTHESIA EXCISION OF ANAL SKIN TAG AND INTERNAL PAPILLA     Surgeon(s): Leighton Ruff, MD  ASSISTANT: none   ANESTHESIA:   local and general  SPECIMEN:  Source of Specimen:  anal skin tags  DISPOSITION OF SPECIMEN:  PATHOLOGY  COUNTS:  YES  PLAN OF CARE: Discharge to home after PACU  PATIENT DISPOSITION:  PACU - hemodynamically stable.  INDICATION: 30 y.o. F with chronic anal irritation due to a hypertrophied anal papilla and anterior skin tag   OR FINDINGS: hypertrophied anal papilla and anterior skin tag  DESCRIPTION: the patient was identified in the preoperative holding area and taken to the OR where they were laid on the operating room table.  MAC anesthesia was induced without difficulty. The patient was then positioned in prone jackknife position with buttocks gently taped apart.  The patient was then prepped and draped in usual sterile fashion.  SCDs were noted to be in place prior to the initiation of anesthesia. A surgical timeout was performed indicating the correct patient, procedure, positioning and need for preoperative antibiotics.  A rectal block was performed using Marcaine with epinephrine.    I began with a digital rectal exam.  There were no masses.  I then placed a Hill-Ferguson anoscope into the anal canal and evaluated this completely.  There were no other pathological findings except her known hypertrophied anal papilla and anterior skin tag.  The skin tag was removed sharply.  The skin was closed with 3-0 Chromic sutures.  The papilla was removed sharply and the mucosa was closed using a 2-0  Chromic suture.  The patient tolerated this well.  All counts were correct.  She was awakened and sent to the PACU in stable condition.

## 2016-02-11 NOTE — Anesthesia Procedure Notes (Signed)
Procedure Name: MAC Date/Time: 02/11/2016 7:30 AM Performed by: Wanita Chamberlain Pre-anesthesia Checklist: Patient identified, Emergency Drugs available, Suction available, Patient being monitored and Timeout performed Patient Re-evaluated:Patient Re-evaluated prior to inductionOxygen Delivery Method: Nasal cannula Intubation Type: IV induction Placement Confirmation: positive ETCO2 Dental Injury: Teeth and Oropharynx as per pre-operative assessment

## 2016-02-13 ENCOUNTER — Encounter (HOSPITAL_BASED_OUTPATIENT_CLINIC_OR_DEPARTMENT_OTHER): Payer: Self-pay | Admitting: General Surgery

## 2016-02-13 IMAGING — MR MR FOOT*L* W/O CM
3 of 5 series · 8 of 40 positions shown · non-contrast
Comparison: 07/12/2014

CLINICAL DATA: Heel and Achilles pain for 1.5 years. Remote foot
fracture.

EXAM:
MRI OF THE LEFT FOREFOOT WITHOUT CONTRAST
TECHNIQUE: Multiplanar, multisequence MR imaging of the ankle was performed. No
intravenous contrast was administered.

[Series 3: PD fat-sat · axial · 3.0mm · 0.20mm/px · z∈[-80,-6]mm · 3 of 27 slices shown]
[im 4/27]
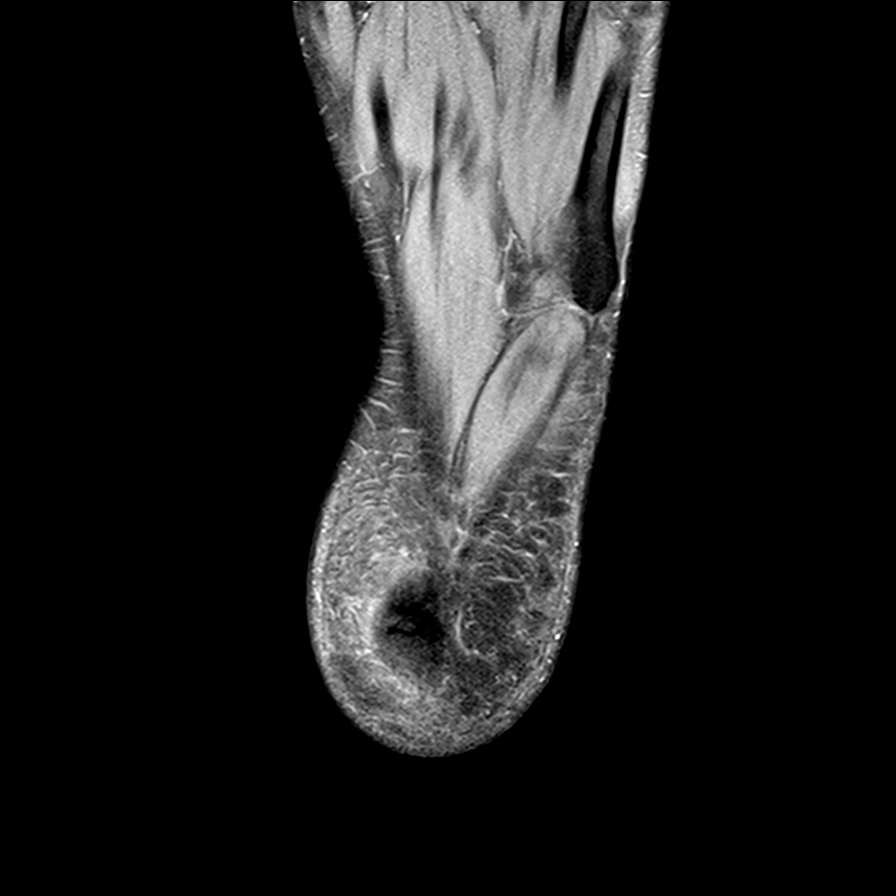
[im 14/27]
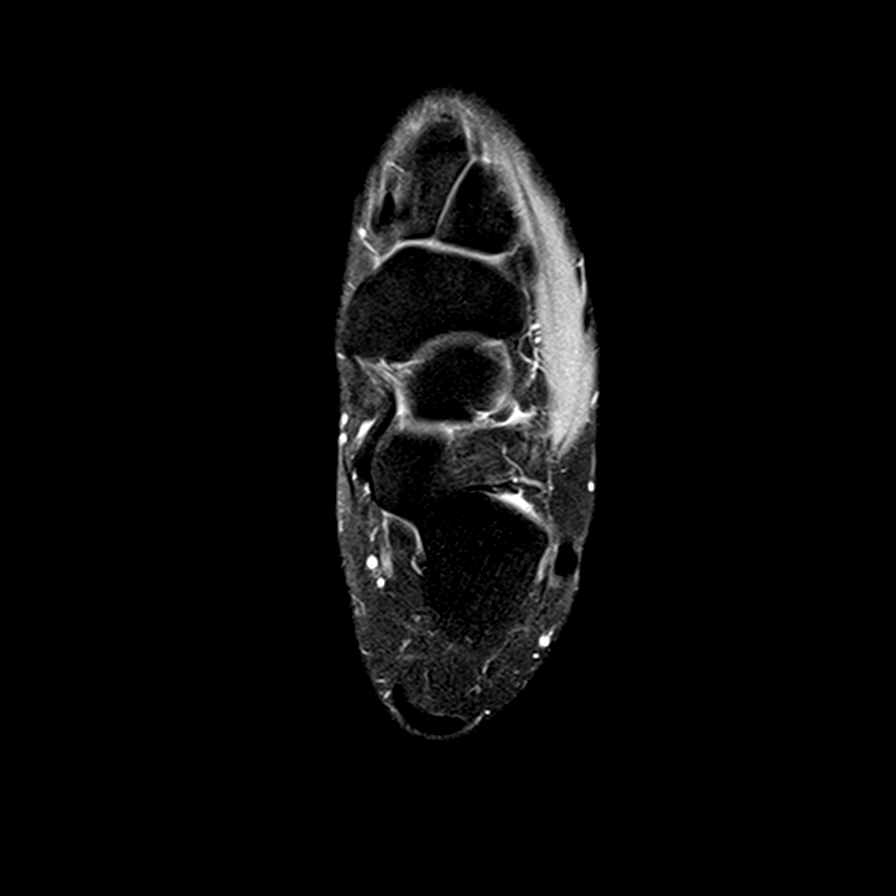
[im 23/27]
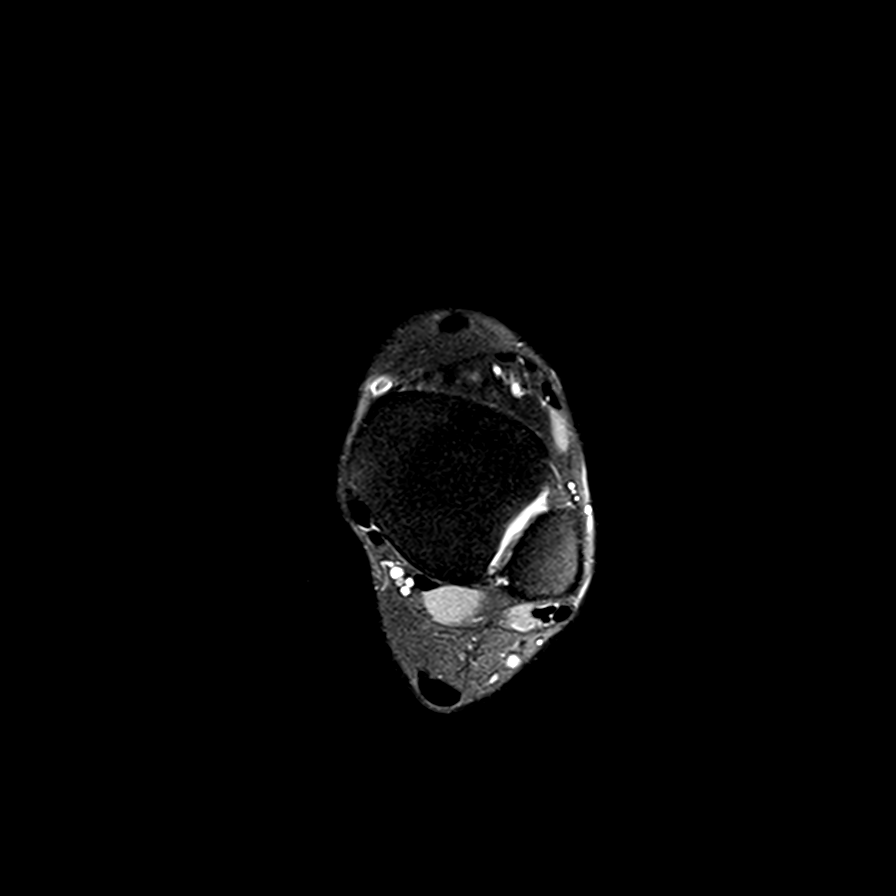

[Series 4: T2 fat-sat · axial · 3.0mm · 0.20mm/px · z∈[-80,-6]mm · 3 of 27 slices shown (1 of 2)]
[im 4/27]
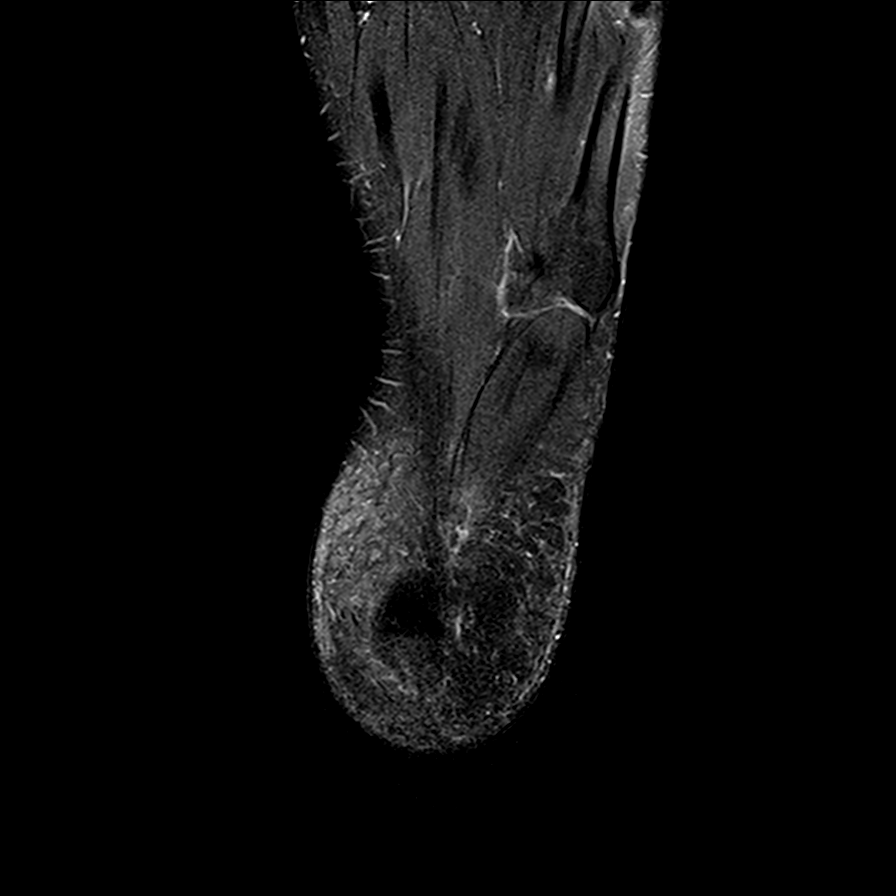
[im 15/27]
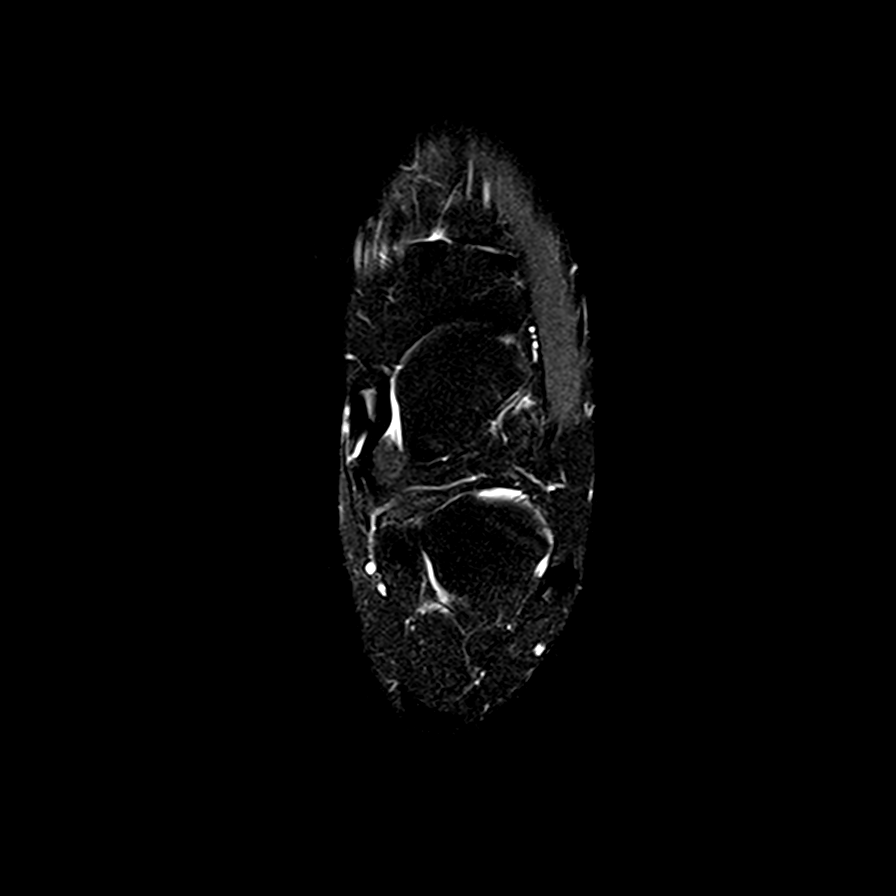
[im 23/27]
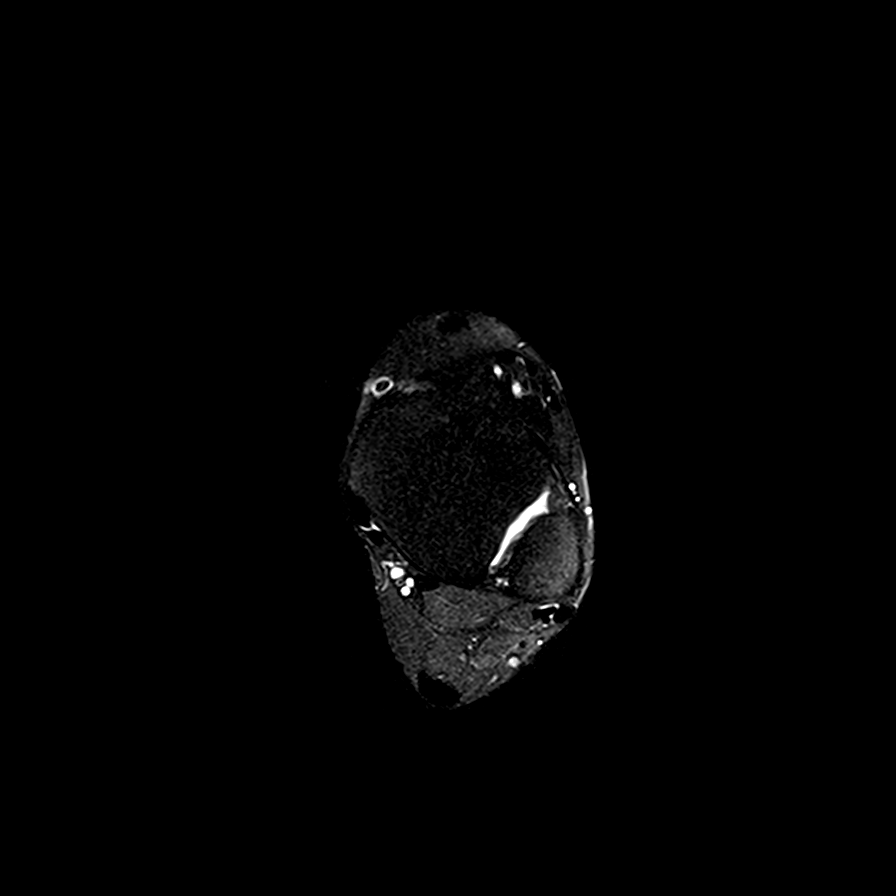

[Series 6: T2 fat-sat · coronal · 3.0mm · 0.33mm/px · 2 of 30 slices shown (2 of 2)]
[im 4/30]
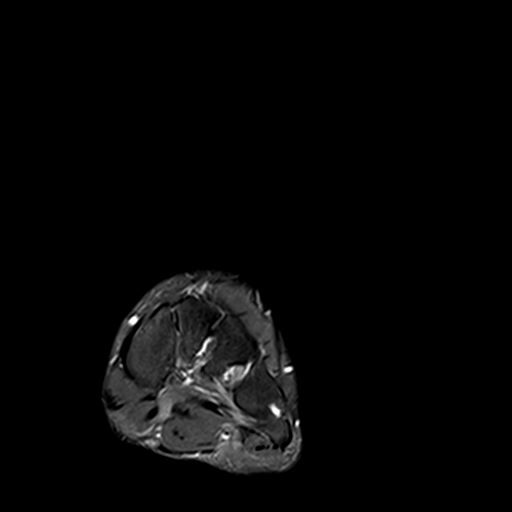
[im 15/30]
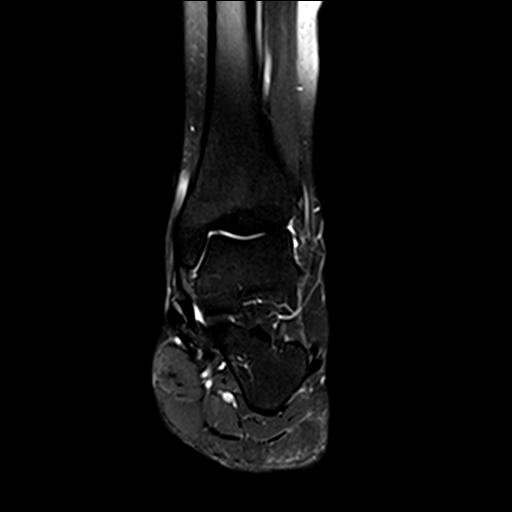

[8 of 40 positions shown; findings below may reference images not displayed]

FINDINGS: TENDONS

Peroneal: Linear increased signal in the peroneus brevis tendon
posterior to and below the lateral malleolus suspicious for small or
chronic longitudinal tear.

Posteromedial: Intact

Anterior: Intact

Achilles: Intact. No expansion or tendinopathy observed. No edema in
Kager' s fat pad or pre Achilles bursitis.

Plantar Fascia: Abnormal edema proximally deep to the medial and
lateral bands of the plantar fascia, as on image 14 of series 7, and
tracking in the abductor digiti minimi muscle.

LIGAMENTS

Lateral: Intact

Medial: Intact

CARTILAGE

Ankle Joint: Intact

Subtalar Joints/Sinus Tarsi: Intact

Bones: Suspected bone island in the calcaneus adjacent to the
plantar attachment of the plantar fascia laterally.
IMPRESSION: 1. Plantar fasciitis. There is associated edema tracking in the
abductor digiti minimi muscle.
2. Chronic or mild longitudinal tearing in the peroneus brevis
tendon.
3. The Achilles tendon is intact.

## 2016-06-02 ENCOUNTER — Ambulatory Visit (INDEPENDENT_AMBULATORY_CARE_PROVIDER_SITE_OTHER): Payer: BLUE CROSS/BLUE SHIELD | Admitting: Family Medicine

## 2016-06-02 ENCOUNTER — Encounter: Payer: Self-pay | Admitting: Family Medicine

## 2016-06-02 VITALS — BP 110/80 | HR 98 | Temp 97.2°F | Wt 137.2 lb

## 2016-06-02 DIAGNOSIS — R300 Dysuria: Secondary | ICD-10-CM

## 2016-06-02 LAB — POC URINALSYSI DIPSTICK (AUTOMATED)
BILIRUBIN UA: NEGATIVE
GLUCOSE UA: NEGATIVE
KETONES UA: NEGATIVE
Leukocytes, UA: NEGATIVE
Nitrite, UA: NEGATIVE
PH UA: 6
Protein, UA: NEGATIVE
RBC UA: NEGATIVE
Spec Grav, UA: >=1.03
Urobilinogen, UA: 4

## 2016-06-02 MED ORDER — SULFAMETHOXAZOLE-TRIMETHOPRIM 400-80 MG PO TABS: 1.0000 | ORAL_TABLET | Freq: Two times a day (BID) | ORAL | Status: DC

## 2016-06-02 NOTE — Progress Notes (Signed)
Pre visit review using our clinic review tool, if applicable. No additional management support is needed unless otherwise documented below in the visit note.  Dysuria:yes, pain and frequency.  duration of symptoms: about 5 days.  abdominal pain: yes fevers:no back pain: lower back pain Vomiting:no U/a d/w pt.   Ucx pending.  H/o IC.    Meds, vitals, and allergies reviewed.   Per HPI unless specifically indicated in ROS section   GEN: nad, alert and oriented HEENT: mucous membranes moist NECK: supple CV: rrr.  PULM: ctab, no inc wob ABD: soft, +bs, suprapubic area tender EXT: no edema SKIN: no acute rash BACK: no CVA pain

## 2016-06-02 NOTE — Patient Instructions (Signed)
Drink plenty of water and start the antibiotics today.  We'll contact you with your lab report.  Take care.   

## 2016-06-03 NOTE — Assessment & Plan Note (Signed)
Could be uti vs IC flare, d/w pt.  Check ucx, start septra in meantime.  She agrees.  Okay for outpatient f/u.  See AVS.

## 2016-06-04 LAB — URINE CULTURE

## 2016-07-02 ENCOUNTER — Encounter: Payer: Self-pay | Admitting: Family Medicine

## 2016-07-02 ENCOUNTER — Ambulatory Visit (INDEPENDENT_AMBULATORY_CARE_PROVIDER_SITE_OTHER): Payer: BLUE CROSS/BLUE SHIELD | Admitting: Family Medicine

## 2016-07-02 VITALS — BP 106/62 | HR 130 | Temp 98.2°F | Wt 140.2 lb

## 2016-07-02 DIAGNOSIS — N760 Acute vaginitis: Secondary | ICD-10-CM

## 2016-07-02 DIAGNOSIS — R102 Pelvic and perineal pain: Secondary | ICD-10-CM

## 2016-07-02 DIAGNOSIS — B9689 Other specified bacterial agents as the cause of diseases classified elsewhere: Secondary | ICD-10-CM

## 2016-07-02 DIAGNOSIS — A499 Bacterial infection, unspecified: Secondary | ICD-10-CM | POA: Diagnosis not present

## 2016-07-02 DIAGNOSIS — N926 Irregular menstruation, unspecified: Secondary | ICD-10-CM

## 2016-07-02 LAB — POC URINALSYSI DIPSTICK (AUTOMATED)
Bilirubin, UA: NEGATIVE
Blood, UA: NEGATIVE
Glucose, UA: NEGATIVE
LEUKOCYTES UA: NEGATIVE
NITRITE UA: NEGATIVE
Spec Grav, UA: 1.025
UROBILINOGEN UA: 0.2
pH, UA: 6

## 2016-07-02 LAB — POCT URINE PREGNANCY: PREG TEST UR: NEGATIVE

## 2016-07-02 MED ORDER — METRONIDAZOLE 500 MG PO TABS: 500.0000 mg | ORAL_TABLET | Freq: Two times a day (BID) | ORAL | 0 refills | Status: DC

## 2016-07-02 NOTE — Progress Notes (Signed)
Pre visit review using our clinic review tool, if applicable. No additional management support is needed unless otherwise documented below in the visit note. 

## 2016-07-02 NOTE — Progress Notes (Signed)
Subjective:    Patient ID: Pamela Calhoun, female    DOB: 1986/01/21, 30 y.o.   MRN: [id number]  HPI This is a 30 yo female who presents today with pelvic pain and fishy smelling discharge for about a week. Pain is low, bilateral, sharp. Throughout day. Took Advil 600 mg without relief. No fever, no pain with walking. Mild nausea, no vomiting. No diarrhea/constipation. No dysuria/hematuria or urinary frequency.   Is sexually active. Uses condoms and condom broke about 3 weeks ago. LMP 06/26/16, only lasted 2 days. Has had recurrent BV with similar symptoms. Had chlamydia in high school. Has regular gyn care.  Had cold symptoms about week ago, they have sense resolved.   Past Medical History:  Diagnosis Date  . Allergy   . Cancer ([place])    skin- hystory of melanoma   . Depression   . Endometriosis   . Fracture    right foot  . Frequent UTI   . History of shingles   . Interstitial cystitis   . Pap smear, abnormal    cryosx   . PID (acute pelvic inflammatory disease)    hx of  . Ureteral reflux    as a child    Past Surgical History:  Procedure Laterality Date  . DILATION AND CURETTAGE OF UTERUS    . EXCISION OF SKIN TAG N/A 02/11/2016   Procedure: EXCISION OF ANAL SKIN TAG AN INTERNAL PAPILLA ;  Surgeon: Leighton Ruff, MD;  Location: [facility];  Service: General;  Laterality: N/A;  . LAPAROSCOPY     pelvic   . MELANOMA EXCISION     Right leg   . TONSILLECTOMY     Family History  Problem Relation Age of Onset  . Cancer Maternal Grandmother     breast   Social History  Substance Use Topics  . Smoking status: Former Smoker    Quit date: 02/04/2012  . Smokeless tobacco: Former Systems developer     Comment: quit 2 years ago  . Alcohol use 0.6 oz/week    1 Standard drinks or equivalent per week     Comment: occassionally      Review of Systems Per HPI    Objective:   Physical Exam  Constitutional: She is oriented to person, place, and time. She appears  well-developed and well-nourished.  HENT:  Head: Normocephalic and atraumatic.  Eyes: Conjunctivae are normal.  Cardiovascular: Regular rhythm and normal heart sounds.   HR 100 on auscultation.   Pulmonary/Chest: Effort normal and breath sounds normal.  Abdominal: Soft. Bowel sounds are normal. She exhibits no distension and no mass. There is tenderness (mild llq and rlq). There is no rebound and no guarding.  Genitourinary: Pelvic exam was performed with patient supine. There is no rash, tenderness, lesion or injury on the right labia. There is no rash, tenderness, lesion or injury on the left labia. Cervix exhibits no motion tenderness. Vaginal discharge (thin, fishy ) found.  Genitourinary Comments: Mildly tender with bimanual exam, no masses or fullness.   Musculoskeletal: Normal range of motion.  Neurological: She is alert and oriented to person, place, and time.  Skin: Skin is warm and dry.  Psychiatric: She has a normal mood and affect. Her behavior is normal. Judgment and thought content normal.  Vitals reviewed.     BP 106/62 (BP Location: Left Arm, Patient Position: Sitting, Cuff Size: Normal)   Pulse (!) 130   Temp 98.2 F (36.8 C) (Oral)   Wt 140  lb 4 oz (63.6 kg)   SpO2 98%   BMI 20.12 kg/m  Wt Readings from Last 3 Encounters:  07/02/16 140 lb 4 oz (63.6 kg)  06/02/16 137 lb 4 oz (62.3 kg)  02/11/16 134 lb 8 oz (61 kg)   Results for orders placed or performed in visit on 07/02/16  POCT Urinalysis Dipstick (Automated)  Result Value Ref Range   Color, UA Yellow    Clarity, UA Clear    Glucose, UA Negative    Bilirubin, UA Negative    Ketones, UA Trace    Spec Grav, UA 1.025    Blood, UA Negative    pH, UA 6.0    Protein, UA 30 mg/dL    Urobilinogen, UA 0.2    Nitrite, UA Negative    Leukocytes, UA Negative Negative  POCT urine pregnancy  Result Value Ref Range   Preg Test, Ur Negative Negative       Assessment & Plan:  1. Vaginal pain - POCT  Urinalysis Dipstick (Automated) - GC/Chlamydia Probe Amp - WET PREP BY MOLECULAR PROBE - RTC precautions reviewed  2. Menstrual periods irregular - POCT urine pregnancy- negative  3. BV (bacterial vaginosis) - given exam findings and patient's history of previous BV, will go ahead and treat while awaiting additional tests.  - Provided written and verbal information regarding diagnosis and treatment including reminder not to consume alcohol while taking metronidazole and for 24 hours once finished.  - metroNIDAZOLE (FLAGYL) 500 MG tablet; Take 1 tablet (500 mg total) by mouth 2 (two) times daily.  Dispense: 14 tablet; Refill: 0 - GC/Chlamydia Probe Amp - WET PREP BY MOLECULAR PROBE   Clarene Reamer, FNP-BC  [facility]  07/03/2016 4:46 PM

## 2016-07-02 NOTE — Patient Instructions (Signed)
No alcohol while taking metronidazole and for 24 hours after I will notify you of your other test results in 3-5 days If you get an opportunity, please activate your Mychart account to receive email notifications of lab test results If you develop a fever or worsening pain, please return or go to the ER.   Bacterial Vaginosis Bacterial vaginosis is a vaginal infection that occurs when the normal balance of bacteria in the vagina is disrupted. It results from an overgrowth of certain bacteria. This is the most common vaginal infection in women of childbearing age. Treatment is important to prevent complications, especially in pregnant women, as it can cause a premature delivery. CAUSES  Bacterial vaginosis is caused by an increase in harmful bacteria that are normally present in smaller amounts in the vagina. Several different kinds of bacteria can cause bacterial vaginosis. However, the reason that the condition develops is not fully understood. RISK FACTORS Certain activities or behaviors can put you at an increased risk of developing bacterial vaginosis, including:  Having a new sex partner or multiple sex partners.  Douching.  Using an intrauterine device (IUD) for contraception. Women do not get bacterial vaginosis from toilet seats, bedding, swimming pools, or contact with objects around them. SIGNS AND SYMPTOMS  Some women with bacterial vaginosis have no signs or symptoms. Common symptoms include:  Grey vaginal discharge.  A fishlike odor with discharge, especially after sexual intercourse.  Itching or burning of the vagina and vulva.  Burning or pain with urination. DIAGNOSIS  Your health care provider will take a medical history and examine the vagina for signs of bacterial vaginosis. A sample of vaginal fluid may be taken. Your health care provider will look at this sample under a microscope to check for bacteria and abnormal cells. A vaginal pH test may also be done.    TREATMENT  Bacterial vaginosis may be treated with antibiotic medicines. These may be given in the form of a pill or a vaginal cream. A second round of antibiotics may be prescribed if the condition comes back after treatment. Because bacterial vaginosis increases your risk for sexually transmitted diseases, getting treated can help reduce your risk for chlamydia, gonorrhea, HIV, and herpes. HOME CARE INSTRUCTIONS   Only take over-the-counter or prescription medicines as directed by your health care provider.  If antibiotic medicine was prescribed, take it as directed. Make sure you finish it even if you start to feel better.  Tell all sexual partners that you have a vaginal infection. They should see their health care provider and be treated if they have problems, such as a mild rash or itching.  During treatment, it is important that you follow these instructions:  Avoid sexual activity or use condoms correctly.  Do not douche.  Avoid alcohol as directed by your health care provider.  Avoid breastfeeding as directed by your health care provider. SEEK MEDICAL CARE IF:   Your symptoms are not improving after 3 days of treatment.  You have increased discharge or pain.  You have a fever. MAKE SURE YOU:   Understand these instructions.  Will watch your condition.  Will get help right away if you are not doing well or get worse. FOR MORE INFORMATION  Centers for Disease Control and Prevention, Division of STD Prevention: AppraiserFraud.fi American Sexual Health Association (ASHA): www.ashastd.org    This information is not intended to replace advice given to you by your health care provider. Make sure you discuss any questions you have with your  health care provider.   Document Released: 11/09/2005 Document Revised: 11/30/2014 Document Reviewed: 06/21/2013 Elsevier Interactive Patient Education [employer].

## 2016-07-03 LAB — WET PREP BY MOLECULAR PROBE
Candida species: NEGATIVE
Gardnerella vaginalis: POSITIVE — AB
TRICHOMONAS VAG: NEGATIVE

## 2016-07-03 LAB — GC/CHLAMYDIA PROBE AMP
CT PROBE, AMP APTIMA: NOT DETECTED
GC Probe RNA: NOT DETECTED

## 2016-08-19 ENCOUNTER — Ambulatory Visit (INDEPENDENT_AMBULATORY_CARE_PROVIDER_SITE_OTHER): Payer: BLUE CROSS/BLUE SHIELD | Admitting: Family Medicine

## 2016-08-19 ENCOUNTER — Encounter: Payer: Self-pay | Admitting: Family Medicine

## 2016-08-19 VITALS — BP 110/70 | HR 93 | Ht 70.0 in | Wt 142.0 lb

## 2016-08-19 DIAGNOSIS — R110 Nausea: Secondary | ICD-10-CM | POA: Diagnosis not present

## 2016-08-19 DIAGNOSIS — N12 Tubulo-interstitial nephritis, not specified as acute or chronic: Secondary | ICD-10-CM

## 2016-08-19 LAB — POC URINALSYSI DIPSTICK (AUTOMATED)
Bilirubin, UA: NEGATIVE
GLUCOSE UA: NEGATIVE
Ketones, UA: NEGATIVE
PH UA: 6
Spec Grav, UA: 1.025
UROBILINOGEN UA: NEGATIVE

## 2016-08-19 MED ORDER — CIPROFLOXACIN HCL 500 MG PO TABS: 500.0000 mg | ORAL_TABLET | Freq: Two times a day (BID) | ORAL | 0 refills | Status: DC

## 2016-08-19 MED ORDER — ONDANSETRON 8 MG PO TBDP: 8.0000 mg | ORAL_TABLET | Freq: Once | ORAL | Status: DC

## 2016-08-19 MED ORDER — ONDANSETRON 4 MG PO TBDP
8.0000 mg | ORAL_TABLET | Freq: Once | ORAL | Status: AC
  Administered 2016-08-19: 8 mg via ORAL

## 2016-08-19 MED ORDER — ONDANSETRON HCL 4 MG/5ML PO SOLN: 4.0000 mg | Freq: Two times a day (BID) | ORAL | Status: DC

## 2016-08-19 MED ORDER — ONDANSETRON 8 MG PO TBDP: 8.0000 mg | ORAL_TABLET | Freq: Three times a day (TID) | ORAL | 0 refills | Status: DC | PRN

## 2016-08-19 MED ORDER — CEFTRIAXONE SODIUM 1 G IJ SOLR
1.0000 g | Freq: Once | INTRAMUSCULAR | Status: AC
  Administered 2016-08-19: 1 g via INTRAMUSCULAR

## 2016-08-19 NOTE — Progress Notes (Signed)
Subjective:    Patient ID: Pamela [place], female    DOB: June 01, 1986, 30 y.o.   MRN: [id number]  HPI This is a 30 yo female, accompanied by a friend, who presents today with dysuria x 2 weeks, left lower side back pain x 2 days. Has taken alleve 2 tablets this morning without improvement. Nausea no vomiting. Subjective fever. No chills. Significant lower abdominal pain/pressure. Was seen 07/02/16 with vaginal discharge, wet prep positive for BV. She completed treatment and reports vaginal symptoms resolved completely. She is due to start her period any day now. Is on OCPs, has not missed any doses.  Postponed seeking treatment because she thought she was having interstitial cystitis symptoms. Has a follow up appointment with urology in a couple of weeks.   Past Medical History:  Diagnosis Date  . Allergy   . Cancer ([place])    skin- hystory of melanoma   . Depression   . Endometriosis   . Fracture    right foot  . Frequent UTI   . History of shingles   . Interstitial cystitis   . Pap smear, abnormal    cryosx   . PID (acute pelvic inflammatory disease)    hx of  . Ureteral reflux    as a child    Past Surgical History:  Procedure Laterality Date  . DILATION AND CURETTAGE OF UTERUS    . EXCISION OF SKIN TAG N/A 02/11/2016   Procedure: EXCISION OF ANAL SKIN TAG AN INTERNAL PAPILLA ;  Surgeon: Leighton Ruff, MD;  Location: [facility];  Service: General;  Laterality: N/A;  . LAPAROSCOPY     pelvic   . MELANOMA EXCISION     Right leg   . TONSILLECTOMY     Family History  Problem Relation Age of Onset  . Cancer Maternal Grandmother     breast   Social History  Substance Use Topics  . Smoking status: Former Smoker    Quit date: 02/04/2012  . Smokeless tobacco: Former Systems developer     Comment: quit 2 years ago  . Alcohol use 0.6 oz/week    1 Standard drinks or equivalent per week     Comment: occassionally      Review of Systems Per HPI    Objective:   Physical Exam  Constitutional: She is oriented to person, place, and time. She appears well-developed and well-nourished. No distress.  HENT:  Head: Normocephalic.  Eyes: Conjunctivae are normal.  Cardiovascular: Normal rate, regular rhythm and normal heart sounds.   Pulmonary/Chest: Effort normal and breath sounds normal.  Abdominal: Soft. Bowel sounds are normal. She exhibits no distension. There is tenderness in the suprapubic area. There is CVA tenderness (L>R).  Neurological: She is alert and oriented to person, place, and time.  Skin: Skin is warm and dry. She is not diaphoretic.  Psychiatric: She has a normal mood and affect. Her behavior is normal. Judgment and thought content normal.  Vitals reviewed.     BP 110/70   Pulse 93   Ht 5\' 10"  (1.778 m)   Wt 142 lb (64.4 kg)   LMP 07/19/2016   SpO2 99%   BMI 20.37 kg/m  Wt Readings from Last 3 Encounters:  08/19/16 142 lb (64.4 kg)  07/02/16 140 lb 4 oz (63.6 kg)  06/02/16 137 lb 4 oz (62.3 kg)   Results for orders placed or performed in visit on 08/19/16  POCT Urinalysis Dipstick (Automated)  Result Value Ref Range  Color, UA AMBER    Clarity, UA CLOUDY    Glucose, UA NEG    Bilirubin, UA NEG    Ketones, UA NEG    Spec Grav, UA 1.025    Blood, UA 3+    pH, UA 6.0    Protein, UA +-    Urobilinogen, UA negative    Nitrite, UA -    Leukocytes, UA moderate (2+) (A) Negative        Assessment & Plan:  1. Pyelonephritis - suspicious for pyelo with CVA tenderness, nausea. UA with 2+ leuk, 3+ blood - Provided written and verbal information regarding diagnosis and treatment. - RTC/ER precautions reviewed - ciprofloxacin (CIPRO) 500 MG tablet; Take 1 tablet (500 mg total) by mouth 2 (two) times daily.  Dispense: 14 tablet; Refill: 0 - cefTRIAXone (ROCEPHIN) injection 1 g; Inject 1 g into the muscle once. - POCT Urinalysis Dipstick (Automated) - Urine culture - increase fluids  2. Nausea without vomiting -  ondansetron (ZOFRAN-ODT) 8 MG disintegrating tablet; Take 1 tablet (8 mg total) by mouth every 8 (eight) hours as needed for nausea.  Dispense: 10 tablet; Refill: 0 - POCT Urinalysis Dipstick (Automated) - Urine culture - ondansetron (ZOFRAN-ODT) disintegrating tablet 8 mg; Take 2 tablets (8 mg total) by mouth once.   Clarene Reamer, FNP-BC  [facility]  08/19/2016 12:26 PM

## 2016-08-19 NOTE — Patient Instructions (Signed)
Start your ciprofloxacin tomorrow morning Drink enough liquid to make your urine light yellow Take ondasetron as needed for nausea Keep an eye on your temperature- if over 101, or you have worsening pain, please to to the emergency room or call us  Pyelonephritis, Adult Pyelonephritis is a kidney infection. The kidneys are the organs that filter a person's blood and move waste out of the bloodstream and into the urine. Urine passes from the kidneys, through the ureters, and into the bladder. There are two main types of pyelonephritis:  Infections that come on quickly without any warning (acute pyelonephritis).  Infections that last for a long period of time (chronic pyelonephritis). In most cases, the infection clears up with treatment and does not cause further problems. More severe infections or chronic infections can sometimes spread to the bloodstream or lead to other problems with the kidneys. CAUSES This condition is usually caused by:  Bacteria traveling from the bladder to the kidney through infected urine. The urine in the bladder can become infected with bacteria from:  Bladder infection (cystitis).  Inflammation of the prostate gland (prostatitis).  Sexual intercourse, in females.  Bacteria traveling from the bloodstream to the kidney. RISK FACTORS This condition is more likely to develop in:  Pregnant women.  Older people.  People who have diabetes.  People who have kidney stones or bladder stones.  People who have other abnormalities of the kidney or ureter.  People who have a catheter placed in the bladder.  People who have cancer.  People who are sexually active.  Women who use spermicides.  People who have had a prior urinary tract infection. SYMPTOMS Symptoms of this condition include:  Frequent urination.  Strong or persistent urge to urinate.  Burning or stinging when urinating.  Abdominal pain.  Back pain.  Pain in the side or flank  area.  Fever.  Chills.  Blood in the urine, or dark urine.  Nausea.  Vomiting. DIAGNOSIS This condition may be diagnosed based on:  Medical history and physical exam.  Urine tests.  Blood tests. You may also have imaging tests of the kidneys, such as an ultrasound or CT scan. TREATMENT Treatment for this condition may depend on the severity of the infection.  If the infection is mild and is found early, you may be treated with antibiotic medicines taken by mouth. You will need to drink fluids to remain hydrated.  If the infection is more severe, you may need to stay in the hospital and receive antibiotics given directly into a vein through an IV tube. You may also need to receive fluids through an IV tube if you are not able to remain hydrated. After your hospital stay, you may need to take oral antibiotics for a period of time. Other treatments may be required, depending on the cause of the infection. HOME CARE INSTRUCTIONS Medicines  Take over-the-counter and prescription medicines only as told by your health care provider.  If you were prescribed an antibiotic medicine, take it as told by your health care provider. Do not stop taking the antibiotic even if you start to feel better. General Instructions  Drink enough fluid to keep your urine clear or pale yellow.  Avoid caffeine, tea, and carbonated beverages. They tend to irritate the bladder.  Urinate often. Avoid holding in urine for long periods of time.  Urinate before and after sex.  After a bowel movement, women should cleanse from front to back. Use each tissue only once.  Keep all follow-up  visits as told by your health care provider. This is important. SEEK MEDICAL CARE IF:  Your symptoms do not get better after 2 days of treatment.  Your symptoms get worse.  You have a fever. SEEK IMMEDIATE MEDICAL CARE IF:  You are unable to take your antibiotics or fluids.  You have shaking chills.  You  vomit.  You have severe flank or back pain.  You have extreme weakness or fainting.   This information is not intended to replace advice given to you by your health care provider. Make sure you discuss any questions you have with your health care provider.   Document Released: 11/09/2005 Document Revised: 07/31/2015 Document Reviewed: 03/04/2015 Elsevier Interactive Patient Education [employer].

## 2016-08-21 LAB — URINE CULTURE

## 2016-09-18 ENCOUNTER — Ambulatory Visit: Payer: BLUE CROSS/BLUE SHIELD | Admitting: Primary Care

## 2016-09-21 ENCOUNTER — Ambulatory Visit: Payer: BLUE CROSS/BLUE SHIELD | Admitting: Family Medicine

## 2016-10-22 ENCOUNTER — Encounter: Payer: Self-pay | Admitting: Primary Care

## 2016-10-22 ENCOUNTER — Ambulatory Visit (INDEPENDENT_AMBULATORY_CARE_PROVIDER_SITE_OTHER): Payer: BLUE CROSS/BLUE SHIELD | Admitting: Primary Care

## 2016-10-22 VITALS — BP 114/80 | HR 83 | Temp 98.2°F | Ht 70.0 in | Wt 144.8 lb

## 2016-10-22 DIAGNOSIS — J069 Acute upper respiratory infection, unspecified: Secondary | ICD-10-CM

## 2016-10-22 MED ORDER — HYDROCOD POLST-CPM POLST ER 10-8 MG/5ML PO SUER: 5.0000 mL | Freq: Every evening | ORAL | 0 refills | Status: DC | PRN

## 2016-10-22 MED ORDER — AZITHROMYCIN 250 MG PO TABS
ORAL_TABLET | ORAL | 0 refills | Status: DC
Start: 2016-10-22 — End: 2017-05-27

## 2016-10-22 NOTE — Patient Instructions (Signed)
Start Azithromycin antibiotics. Take 2 tablets by mouth today, then 1 tablet daily for 4 additional days.  You may take the Tussionex cough suppressant at bedtime as needed for cough and rest. Caution this medication contains codeine and will make you feel drowsy.  Try Delsym DM for daytime cough and congestion.  Ensure you are staying hydrated with water.  It was a pleasure meeting you!

## 2016-10-22 NOTE — Progress Notes (Signed)
Pre visit review using our clinic review tool, if applicable. No additional management support is needed unless otherwise documented below in the visit note. 

## 2016-10-22 NOTE — Progress Notes (Signed)
Subjective:    Patient ID: Pamela [place], female    DOB: [birth year], 30 y.o.   MRN: [id number]  HPI  Ms. Achterberg is a 30 year old female who presents today with a chief complaint of cough. She also reports nasal congestion, fevers (101), sore throat, chills. Her symptoms began Wednesday last week (8 days ago). Her cough is productive with yellow/brown sputum. She's taken Tylenol Cold and Cough and Advil without much improvement. Overall she's beginning to feel worse. 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  Review of Systems  Constitutional: Positive for chills, fatigue and fever.  HENT: Positive for congestion, sinus pressure and sore throat. Negative for ear pain.   Respiratory: Positive for cough.        Past Medical History:  Diagnosis Date  . Allergy   . Cancer ([place])    skin- hystory of melanoma   . Depression   . Endometriosis   . Fracture    right foot  . Frequent UTI   . History of shingles   . Interstitial cystitis   . Pap smear, abnormal    cryosx   . PID (acute pelvic inflammatory disease)    hx of  . Ureteral reflux    as a child      Social History   Social History  . Marital status: Single    Spouse name: N/A  . Number of children: N/A  . Years of education: N/A   Occupational History  . Not on file.   Social History Main Topics  . Smoking status: Former Smoker    Quit date: 02/04/2012  . Smokeless tobacco: Former Systems developer     Comment: quit 2 years ago  . Alcohol use 0.6 oz/week    1 Standard drinks or equivalent per week     Comment: occassionally  . Drug use: No  . Sexual activity: Not on file   Other Topics Concern  . Not on file   Social History Narrative  . No narrative on file    Past Surgical History:  Procedure Laterality Date  . DILATION AND CURETTAGE OF UTERUS    . EXCISION OF SKIN TAG N/A 02/11/2016   Procedure: EXCISION OF ANAL SKIN TAG AN INTERNAL PAPILLA ;  Surgeon: Leighton Ruff, MD;   Location: [facility];  Service: General;  Laterality: N/A;  . LAPAROSCOPY     pelvic   . MELANOMA EXCISION     Right leg   . TONSILLECTOMY      Family History  Problem Relation Age of Onset  . Cancer Maternal Grandmother     breast    Allergies  Allergen Reactions  . Cetirizine Hcl     REACTION: diarrhea  . Vicodin [Hydrocodone-Acetaminophen] Itching    Current Outpatient Prescriptions on File Prior to Visit  Medication Sig Dispense Refill  . ALPRAZolam (XANAX) 0.5 MG tablet Take 0.5 mg by mouth 2 (two) times daily as needed (bladder spazms).     . hydrOXYzine (VISTARIL) 25 MG capsule Take 25 mg by mouth at bedtime.     . Meth-Hyo-M Bl-Na Phos-Ph Sal (URIBEL PO) Take 1 tablet every 6 hours as needed.    . Multiple Vitamin (MULTIVITAMIN WITH MINERALS) TABS tablet Take 1 tablet by mouth daily.     No current facility-administered medications on file prior to visit.     BP 114/80   Pulse 83   Temp 98.2 F (36.8 C) (Oral)  Ht 5\' 10"  (1.778 m)   Wt 144 lb 12.8 oz (65.7 kg)   LMP 09/21/2016   SpO2 99%   BMI 20.78 kg/m    Objective:   Physical Exam  Constitutional: She appears well-nourished. She appears ill.  HENT:  Right Ear: Tympanic membrane and ear canal normal.  Left Ear: Tympanic membrane and ear canal normal.  Nose: Right sinus exhibits no maxillary sinus tenderness and no frontal sinus tenderness. Left sinus exhibits no maxillary sinus tenderness and no frontal sinus tenderness.  Mouth/Throat: Oropharynx is clear and moist.  Eyes: Conjunctivae are normal.  Neck: Neck supple.  Cardiovascular: Normal rate and regular rhythm.   Pulmonary/Chest: Effort normal. She has no decreased breath sounds. She has no wheezes. She has rhonchi in the right upper field and the left upper field. She has no rales.  Lymphadenopathy:    She has cervical adenopathy.  Skin: Skin is warm and dry.          Assessment & Plan:  URI:  Cough, congestion,  fevers, fatigue 8 days. Little to no improvement with OTC treatment, now feeling worse. Exam today with mild rhonchi to upper fields, does appear ill, vital stable. Given duration of symptoms coupled With presentation will treat for presumed bacterial involvement. Prescription for azithromycin sent to pharmacy, Tussionex printed for nighttime cough, use Delsym during daytime hours. Fluids, rest, follow-up as needed.  Sheral Flow, NP

## 2017-02-17 ENCOUNTER — Ambulatory Visit: Payer: BLUE CROSS/BLUE SHIELD | Admitting: Family Medicine

## 2017-05-27 ENCOUNTER — Encounter: Payer: Self-pay | Admitting: *Deleted

## 2017-05-27 ENCOUNTER — Encounter: Payer: Self-pay | Admitting: Family Medicine

## 2017-05-27 ENCOUNTER — Ambulatory Visit (INDEPENDENT_AMBULATORY_CARE_PROVIDER_SITE_OTHER): Payer: BLUE CROSS/BLUE SHIELD | Admitting: Family Medicine

## 2017-05-27 VITALS — BP 108/64 | HR 98 | Temp 98.4°F | Wt 142.8 lb

## 2017-05-27 DIAGNOSIS — J019 Acute sinusitis, unspecified: Secondary | ICD-10-CM

## 2017-05-27 DIAGNOSIS — J029 Acute pharyngitis, unspecified: Secondary | ICD-10-CM | POA: Diagnosis not present

## 2017-05-27 LAB — POCT RAPID STREP A (OFFICE): Rapid Strep A Screen: NEGATIVE

## 2017-05-27 MED ORDER — GUAIFENESIN-CODEINE 100-10 MG/5ML PO SYRP: 5.0000 mL | ORAL_SOLUTION | Freq: Two times a day (BID) | ORAL | 0 refills | Status: DC | PRN

## 2017-05-27 MED ORDER — AMOXICILLIN-POT CLAVULANATE 875-125 MG PO TABS: 1.0000 | ORAL_TABLET | Freq: Two times a day (BID) | ORAL | 0 refills | Status: AC

## 2017-05-27 NOTE — Progress Notes (Signed)
BP 108/64 (BP Location: Right Arm, Patient Position: Sitting, Cuff Size: Normal)   Pulse 98   Temp 98.4 F (36.9 C) (Oral)   Wt 142 lb 12 oz (64.8 kg)   SpO2 97%   BMI 20.48 kg/m    CC: cough with ST and fever Subjective:    Patient ID: Pamela [place], female    DOB: 1986/03/17, 31 y.o.   MRN: [id number]  HPI: Pamela [place] is a 31 y.o. female presenting on 05/27/2017 for Cough; Sore Throat; Swollen Glands; Facial Pain; and Generalized Body Aches   1 wk h/o cough with sneezing, then 3d h/o malaise, body aches, fever to 102.2, bad sore throat. Swollen glands in neck. L facial pain started today. Bringing up colored mucous, productive mucous from nose. Earache x 3 wks (attributed to allergies). + PNDrainage.   No tooth pain.   Taking alka seltzer cold and advil.   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  No h/o asthma Non smoker  Relevant past medical, surgical, family and social history reviewed and updated as indicated. Interim medical history since our last visit reviewed. Allergies and medications reviewed and updated. Outpatient Medications Prior to Visit  Medication Sig Dispense Refill  . ALPRAZolam (XANAX) 0.5 MG tablet Take 0.5 mg by mouth 2 (two) times daily as needed (bladder spazms).     Marland Kitchen azithromycin (ZITHROMAX) 250 MG tablet Take 2 tablets by mouth today, then 1 tablet daily for 4 additional days. 6 tablet 0  . chlorpheniramine-HYDROcodone (TUSSIONEX PENNKINETIC ER) 10-8 MG/5ML SUER Take 5 mLs by mouth at bedtime as needed for cough. 50 mL 0  . hydrOXYzine (VISTARIL) 25 MG capsule Take 25 mg by mouth at bedtime.     Marland Kitchen LEVONORGEST-ETH ESTRAD 91-DAY PO Take 1 tablet by mouth daily.    . Meth-Hyo-M Bl-Na Phos-Ph Sal (URIBEL PO) Take 1 tablet every 6 hours as needed.    . Multiple Vitamin (MULTIVITAMIN WITH MINERALS) TABS tablet Take 1 tablet by mouth daily.     No facility-administered medications prior to visit.      Per HPI unless specifically indicated in ROS section  below Review of Systems     Objective:    BP 108/64 (BP Location: Right Arm, Patient Position: Sitting, Cuff Size: Normal)   Pulse 98   Temp 98.4 F (36.9 C) (Oral)   Wt 142 lb 12 oz (64.8 kg)   SpO2 97%   BMI 20.48 kg/m   Wt Readings from Last 3 Encounters:  05/27/17 142 lb 12 oz (64.8 kg)  10/22/16 144 lb 12.8 oz (65.7 kg)  08/19/16 142 lb (64.4 kg)    Physical Exam  Constitutional: She appears well-developed and well-nourished. No distress.  HENT:  Head: Normocephalic and atraumatic.  Right Ear: Hearing, tympanic membrane, external ear and ear canal normal.  Left Ear: Hearing, tympanic membrane, external ear and ear canal normal.  Nose: Mucosal edema (L>R nasal mucosal congestion) present. No rhinorrhea. Right sinus exhibits no maxillary sinus tenderness and no frontal sinus tenderness. Left sinus exhibits maxillary sinus tenderness and frontal sinus tenderness.  Mouth/Throat: Uvula is midline and mucous membranes are normal. Posterior oropharyngeal edema and posterior oropharyngeal erythema present. No oropharyngeal exudate or tonsillar abscesses.  S/p tonsillectomy  Eyes: Conjunctivae and EOM are normal. Pupils are equal, round, and reactive to light. No scleral icterus.  Neck: Normal range of motion. Neck supple.  Cardiovascular: Normal rate, regular rhythm, normal heart sounds and intact distal pulses.   No murmur heard. Pulmonary/Chest:  Effort normal and breath sounds normal. No respiratory distress. She has no wheezes. She has no rales.  Lungs clear  Lymphadenopathy:    She has no cervical adenopathy.  Skin: Skin is warm and dry. No rash noted.  Nursing note and vitals reviewed.     Assessment & Plan:   Problem List Items Addressed This Visit    Acute sinusitis - Primary    Anticipate bacterial given progression of symptoms and high fevers 1 wk into illness. Cover with augmentin antibiotic sent to pharmacy. RST negative cheratussin for night time cough.  Out of  work note provided today.  Further supportive care as per instructions.  Pt agrees with plan.  rec 2nd form birth control while on abx.       Relevant Medications   amoxicillin-clavulanate (AUGMENTIN) 875-125 MG tablet   guaiFENesin-codeine (CHERATUSSIN AC) 100-10 MG/5ML syrup       Follow up plan: Return if symptoms worsen or fail to improve.  Ria Bush, MD

## 2017-05-27 NOTE — Addendum Note (Signed)
Addended by: Tammi Sou on: 05/27/2017 10:11 AM   Modules accepted: Orders

## 2017-05-27 NOTE — Patient Instructions (Signed)
You have a sinus infection. Take medicine as prescribed: augmentin twice daily for 10 days, take with food Use advil 400-600mg  with meals to help inflammation and control fever. Push fluids and plenty of rest. Nasal saline irrigation or neti pot to help drain sinuses. May use plain mucinex with plenty of fluid to help mobilize mucous. Please let us know if fever >101.5, trouble opening/closing mouth, difficulty swallowing, or worsening instead of improving as expected.

## 2017-05-27 NOTE — Assessment & Plan Note (Addendum)
Anticipate bacterial given progression of symptoms and high fevers 1 wk into illness. Cover with augmentin antibiotic sent to pharmacy. RST negative cheratussin for night time cough.  Out of work note provided today.  Further supportive care as per instructions.  Pt agrees with plan.  rec 2nd form birth control while on abx.

## 2017-06-06 LAB — HM PAP SMEAR

## 2017-09-20 ENCOUNTER — Encounter: Payer: Self-pay | Admitting: Internal Medicine

## 2017-09-20 ENCOUNTER — Ambulatory Visit (INDEPENDENT_AMBULATORY_CARE_PROVIDER_SITE_OTHER): Payer: BLUE CROSS/BLUE SHIELD | Admitting: Internal Medicine

## 2017-09-20 VITALS — BP 106/60 | HR 83 | Temp 97.4°F | Wt 139.0 lb

## 2017-09-20 DIAGNOSIS — R21 Rash and other nonspecific skin eruption: Secondary | ICD-10-CM | POA: Diagnosis not present

## 2017-09-20 MED ORDER — TRIAMCINOLONE ACETONIDE 0.1 % EX CREA: 1.0000 "application " | TOPICAL_CREAM | Freq: Two times a day (BID) | CUTANEOUS | 1 refills | Status: DC | PRN

## 2017-09-20 MED ORDER — METHYLPREDNISOLONE ACETATE 80 MG/ML IJ SUSP
80.0000 mg | Freq: Once | INTRAMUSCULAR | Status: AC
  Administered 2017-09-20: 80 mg via INTRAMUSCULAR

## 2017-09-20 NOTE — Assessment & Plan Note (Addendum)
Might be reaction to the doxy but also could be mild form of pityriasis rosea (since came on after infection). The appearance is not classic for PR now--but I showed them pictures and they feel her rash looks like it when it gets worse in evening Asked her to stop the doxy--- staph infections cleared anyway Will try TAC/zyrtec or allegra Will give depomedrol 80mg  IM---she is desperate and if this reaction to the med, should help (though it probably wouldn't help PR)

## 2017-09-20 NOTE — Progress Notes (Signed)
   Subjective:    Patient ID: Pamela [place], female    DOB: [birth year], 30 y.o.   MRN: [id number]  HPI Here due to rash --with mom  Had fever and sore throat about a week ago This resolved Started with rash 1-2 days later All over and itchy  Has tried benedryl without relief of the itching Aloe cream also not helping Changed detergent/body wash, etc  Current Outpatient Prescriptions on File Prior to Visit  Medication Sig Dispense Refill  . ALPRAZolam (XANAX) 0.5 MG tablet Take 0.5 mg by mouth 2 (two) times daily as needed (bladder spazms).     Marland Kitchen ELMIRON 100 MG capsule Take 1 tablet by mouth at bedtime.  11  . levonorgestrel-ethinyl estradiol (SEASONALE,INTROVALE,JOLESSA) 0.15-0.03 MG tablet Take 1 tablet by mouth daily.     No current facility-administered medications on file prior to visit.     Allergies  Allergen Reactions  . Cetirizine Hcl     REACTION: diarrhea  . Vicodin [Hydrocodone-Acetaminophen] Itching    Past Medical History:  Diagnosis Date  . Allergy   . Cancer ([place])    skin- hystory of melanoma   . Depression   . Endometriosis   . Fracture    right foot  . Frequent UTI   . History of shingles   . Interstitial cystitis   . Pap smear, abnormal    cryosx   . PID (acute pelvic inflammatory disease)    hx of  . Ureteral reflux    as a child     Past Surgical History:  Procedure Laterality Date  . DILATION AND CURETTAGE OF UTERUS    . EXCISION OF SKIN TAG N/A 02/11/2016   Procedure: EXCISION OF ANAL SKIN TAG AN INTERNAL PAPILLA ;  Surgeon: Leighton Ruff, MD;  Location: [facility];  Service: General;  Laterality: N/A;  . LAPAROSCOPY     pelvic   . MELANOMA EXCISION     Right leg   . TONSILLECTOMY      Family History  Problem Relation Age of Onset  . Cancer Maternal Grandmother        breast    Social History   Social History  . Marital status: Single    Spouse name: N/A  . Number of children: N/A  . Years of education:  N/A   Occupational History  . Not on file.   Social History Main Topics  . Smoking status: Former Smoker    Quit date: 02/04/2012  . Smokeless tobacco: Former Systems developer  . Alcohol use 0.6 oz/week    1 Standard drinks or equivalent per week     Comment: occassionally  . Drug use: No  . Sexual activity: Not on file   Other Topics Concern  . Not on file   Social History Narrative  . No narrative on file   Review of Systems No fever now Not sick now On doxy still for staph infection (Dr Candie Mile)--- worst in axillae and vagina. These are all gone    Objective:   Physical Exam  Skin:  Mostly papular rash on abdomen Slight redness without a striking rash in other places          Assessment & Plan:

## 2017-09-20 NOTE — Addendum Note (Signed)
Addended by: Pilar Grammes on: 09/20/2017 05:06 PM   Modules accepted: Orders

## 2017-09-20 NOTE — Patient Instructions (Signed)
Please stop the doxycycline---just in case it is involved with the rash. Try over the counter cetirizine 10mg  or fexofenadine 180mg  at bedtime to help itching and maybe help you sleep.

## 2017-10-04 ENCOUNTER — Ambulatory Visit: Payer: Self-pay | Admitting: *Deleted

## 2017-10-04 NOTE — Telephone Encounter (Signed)
  Reason for Disposition . Boil > 2 inches across (> 5 cm; larger than a golf ball or ping pong ball)  Answer Assessment - Initial Assessment Questions 1. APPEARANCE of BOIL: "What does the boil look like?"      Round, size of golf ball 2. LOCATION: "Where is the boil located?"      On right side, under bra line 3. NUMBER: "How many boils are there?"      one 4. SIZE: "How big is the boil?" (e.g., inches, cm; compare to size of a coin or other object)     Golf ball size 5. ONSET: "When did the boil start?"     For a week 6. PAIN: "Is there any pain?" If so, ask: "How bad is the pain?"   (Scale 1-10; or mild, moderate, severe)     Severe 8, on a 1-10 scale 7. FEVER: "Do you have a fever?" If so, ask: "What is it, how was it measured, and when did it start?"      100.7 last night 8. SOURCE: "Have you been around anyone with boils or other Staph infections?" "Have you ever had boils before?"     Has been dealing with staph infections and boils for the past 2 1/2 months MRSA confirmed this past week. 9. OTHER SYMPTOMS: "Do you have any other symptoms?" (e.g., shaking chills, weakness, rash elsewhere on body)     Shaking and chills, rash over entire body from neck down to feet, small bumps 10. PREGNANCY: "Is there any chance you are pregnant?" "When was your last menstrual period?"       No, last cycle on first week of September due to medications.  Protocols used: BOIL (SKIN ABSCESS)-A-AH  Pt has been getting treated for staph infection by Gynecologist due to original boil being in the groin area. Pt states she has been dealing with staph infection and treatment for about 2 1/2 months. Culture was confirmed positive for MRSA about a week ago. Pt has been taking sulfamethoxaz for 6 days and has 4 days left in treatment. Appt scheduled for 8:15 on 10/05/17.

## 2017-10-04 NOTE — Telephone Encounter (Signed)
appt sched with Dr Damita Dunnings 11/13

## 2017-10-05 ENCOUNTER — Ambulatory Visit: Payer: BLUE CROSS/BLUE SHIELD | Admitting: Family Medicine

## 2017-10-05 ENCOUNTER — Encounter (HOSPITAL_COMMUNITY): Payer: Self-pay

## 2017-10-05 ENCOUNTER — Encounter: Payer: Self-pay | Admitting: Family Medicine

## 2017-10-05 ENCOUNTER — Other Ambulatory Visit: Payer: Self-pay

## 2017-10-05 ENCOUNTER — Emergency Department (HOSPITAL_COMMUNITY): Payer: BLUE CROSS/BLUE SHIELD

## 2017-10-05 ENCOUNTER — Telehealth: Payer: Self-pay | Admitting: Family Medicine

## 2017-10-05 ENCOUNTER — Emergency Department (HOSPITAL_COMMUNITY)
Admission: EM | Admit: 2017-10-05 | Discharge: 2017-10-05 | Disposition: A | Discharge status: Home / Self Care | Payer: BLUE CROSS/BLUE SHIELD | Attending: Emergency Medicine | Admitting: Emergency Medicine

## 2017-10-05 DIAGNOSIS — Z85828 Personal history of other malignant neoplasm of skin: Secondary | ICD-10-CM | POA: Diagnosis not present

## 2017-10-05 DIAGNOSIS — L02213 Cutaneous abscess of chest wall: Secondary | ICD-10-CM | POA: Insufficient documentation

## 2017-10-05 DIAGNOSIS — R21 Rash and other nonspecific skin eruption: Secondary | ICD-10-CM

## 2017-10-05 DIAGNOSIS — Z79899 Other long term (current) drug therapy: Secondary | ICD-10-CM | POA: Insufficient documentation

## 2017-10-05 DIAGNOSIS — Z22322 Carrier or suspected carrier of Methicillin resistant Staphylococcus aureus: Secondary | ICD-10-CM

## 2017-10-05 DIAGNOSIS — Z87891 Personal history of nicotine dependence: Secondary | ICD-10-CM | POA: Insufficient documentation

## 2017-10-05 DIAGNOSIS — R0789 Other chest pain: Secondary | ICD-10-CM

## 2017-10-05 DIAGNOSIS — R600 Localized edema: Secondary | ICD-10-CM | POA: Diagnosis present

## 2017-10-05 DIAGNOSIS — L0291 Cutaneous abscess, unspecified: Secondary | ICD-10-CM

## 2017-10-05 LAB — CBC WITH DIFFERENTIAL/PLATELET
BASOS ABS: 0 10*3/uL (ref 0.0–0.1)
BASOS PCT: 0 %
EOS PCT: 6 %
Eosinophils Absolute: 0.5 10*3/uL (ref 0.0–0.7)
HCT: 39.4 % (ref 36.0–46.0)
Hemoglobin: 13 g/dL (ref 12.0–15.0)
LYMPHS PCT: 39 %
Lymphs Abs: 3.5 10*3/uL (ref 0.7–4.0)
MCH: 31 pg (ref 26.0–34.0)
MCHC: 33 g/dL (ref 30.0–36.0)
MCV: 94 fL (ref 78.0–100.0)
MONO ABS: 0.5 10*3/uL (ref 0.1–1.0)
Monocytes Relative: 5 %
Neutro Abs: 4.6 10*3/uL (ref 1.7–7.7)
Neutrophils Relative %: 50 %
PLATELETS: 294 10*3/uL (ref 150–400)
RBC: 4.19 MIL/uL (ref 3.87–5.11)
RDW: 13.2 % (ref 11.5–15.5)
WBC: 9.2 10*3/uL (ref 4.0–10.5)

## 2017-10-05 LAB — COMPREHENSIVE METABOLIC PANEL
ALK PHOS: 60 U/L (ref 38–126)
ALT: 14 U/L (ref 14–54)
ANION GAP: 7 (ref 5–15)
AST: 16 U/L (ref 15–41)
Albumin: 3.6 g/dL (ref 3.5–5.0)
BILIRUBIN TOTAL: 0.4 mg/dL (ref 0.3–1.2)
BUN: 12 mg/dL (ref 6–20)
CALCIUM: 8.5 mg/dL — AB (ref 8.9–10.3)
CO2: 23 mmol/L (ref 22–32)
Chloride: 102 mmol/L (ref 101–111)
Creatinine, Ser: 0.92 mg/dL (ref 0.44–1.00)
GFR calc Af Amer: >60 mL/min (ref >60)
Glucose, Bld: 87 mg/dL (ref 65–99)
POTASSIUM: 3.7 mmol/L (ref 3.5–5.1)
Sodium: 132 mmol/L — ABNORMAL LOW (ref 135–145)
TOTAL PROTEIN: 6.8 g/dL (ref 6.5–8.1)

## 2017-10-05 LAB — I-STAT CG4 LACTIC ACID, ED
LACTIC ACID, VENOUS: 1.12 mmol/L (ref 0.5–1.9)
Lactic Acid, Venous: 0.73 mmol/L (ref 0.5–1.9)

## 2017-10-05 LAB — I-STAT BETA HCG BLOOD, ED (MC, WL, AP ONLY)

## 2017-10-05 LAB — I-STAT TROPONIN, ED: TROPONIN I, POC: 0 ng/mL (ref 0.00–0.08)

## 2017-10-05 LAB — CK TOTAL AND CKMB (NOT AT ARMC)
CK, MB: 1.6 ng/mL (ref 0.5–5.0)
Relative Index: INVALID (ref 0.0–2.5)
Total CK: 66 U/L (ref 38–234)

## 2017-10-05 MED ORDER — SODIUM CHLORIDE 0.9 % IV BOLUS (SEPSIS)
1000.0000 mL | Freq: Once | INTRAVENOUS | Status: AC
  Administered 2017-10-05: 1000 mL via INTRAVENOUS

## 2017-10-05 MED ORDER — LIDOCAINE-EPINEPHRINE (PF) 2 %-1:200000 IJ SOLN
20.0000 mL | Freq: Once | INTRAMUSCULAR | Status: AC
  Administered 2017-10-05: 20 mL
  Filled 2017-10-05: qty 20

## 2017-10-05 MED ORDER — OXYCODONE-ACETAMINOPHEN 5-325 MG PO TABS
1.0000 | ORAL_TABLET | Freq: Once | ORAL | Status: AC
  Administered 2017-10-05: 1 via ORAL
  Filled 2017-10-05: qty 1

## 2017-10-05 MED ORDER — HYDROXYZINE HCL 10 MG PO TABS
10.0000 mg | ORAL_TABLET | Freq: Once | ORAL | Status: AC
  Administered 2017-10-05: 10 mg via ORAL
  Filled 2017-10-05: qty 1

## 2017-10-05 MED ORDER — DIPHENHYDRAMINE HCL 25 MG PO CAPS
25.0000 mg | ORAL_CAPSULE | Freq: Once | ORAL | Status: AC
  Administered 2017-10-05: 25 mg via ORAL
  Filled 2017-10-05: qty 1

## 2017-10-05 MED ORDER — IBUPROFEN 800 MG PO TABS: 800.0000 mg | ORAL_TABLET | Freq: Three times a day (TID) | ORAL | 0 refills | Status: DC

## 2017-10-05 MED ORDER — HYDROXYZINE HCL 25 MG PO TABS: 25.0000 mg | ORAL_TABLET | Freq: Four times a day (QID) | ORAL | 0 refills | Status: DC

## 2017-10-05 NOTE — Assessment & Plan Note (Addendum)
On septra, with temp up to 100.3, lightheaded.   Coarse BS.  Not tachy.  ddx d/w pt and driver.   Needs ER eval.  Doesn't appear to need EMS transport with family that can take her directly to ER . The current rash can't be attributed to septra use- it predates the septra.  Will call ER about pending arrival by car.  She'll likely need labs and IVF, at least.  D/w pt.  She agrees.  Patient has left OV and is in route to ER.

## 2017-10-05 NOTE — Progress Notes (Signed)
She has been lightheaded for the last 5 days.  Lower BP noted today.  Fevers for the last 2 days, up to 100.3 on home checks.  Chills and aches, pain with cough.  Some occ discolored sputum.    She is on septra currently for MRSA, would cx done last week by OBGYN.  Reported by patient.  She has R chest wall draining wound.  It spontaneously started to drain.   New rash but predates the septra.  Has tolerated septra in the past.  Itchy rash.  On the trunk.    She has a driver.   Meds, vitals, and allergies reviewed.   ROS: Per HPI unless specifically indicated in ROS section   nad Lightheaded on standing.   OP with cobblestoning, TMs wnl Neck supple, no LA Rrr, not tachy Coarse BS B abd soft Ext w/o edema, well perfused.  Blanching irregular rash in on the trunk B.  Draining abscess on the R chest wall, covered.

## 2017-10-05 NOTE — ED Notes (Signed)
Non-adherent dressing applied with tegaderm over wound site.

## 2017-10-05 NOTE — ED Notes (Signed)
Pt eating on arrival to room.

## 2017-10-05 NOTE — Discharge Instructions (Addendum)
As discussed, make sure you stay well-hydrated. Take ibuprofen for pain and follow up with Infectious disease Discontinue bactrim.  Take hydroxyzine for itching as needed. Have the wound rechecked in 48 hours. Monitor for any signs of worsening infection and return sooner if redness spreading around the wound , fever, chills, nausea, vomiting, or other new concerning symptoms in the meantime.

## 2017-10-05 NOTE — Telephone Encounter (Signed)
I wouldn't leave.  I think she should stay to be seen.  Thanks.

## 2017-10-05 NOTE — Telephone Encounter (Signed)
Phone call from the pt.  Reported she saw Dr. Damita Dunnings this AM for a boil and has MRSA.  Reported she was advised to go to the ER.  Stated she has been waiting greater than 5 hrs.  Pt. tearful.  Questioned whether she should continue to wait for treatment, or go to another ER? Stated she has inquired about how much longer, and has been told several times, "another 40 minutes."    Acknowledged pt's feelings, and encouraged her to stay at the [facility] ER, and Triage nurse will make Dr. Damita Dunnings aware of the situation.  The pt. agreed.

## 2017-10-05 NOTE — Patient Instructions (Signed)
Go to the ER at [facility].  We'll call ahead.  Tell them at the ER desk that we called ahead.  Take care.  Glad to see you.

## 2017-10-05 NOTE — Telephone Encounter (Signed)
I spoke with pt and advised per Dr Damita Dunnings that pt needs to stay at ED and I apologized for the wait but pt agreed to stay at ED. FYI to Dr Damita Dunnings.

## 2017-10-05 NOTE — ED Notes (Signed)
Pt verbalizes understanding of d/c instructions. Pt received prescriptions. Pt ambulatory at d/c with all belongings.  

## 2017-10-05 NOTE — ED Provider Notes (Signed)
[facility] [facility] EMERGENCY DEPARTMENT Provider Note   CSN: [id number] Arrival date & time: 10/05/17  1023     History   Chief Complaint Chief Complaint  Patient presents with  . Wound Infection    HPI Pamela Calhoun is a 31 y.o. female presenting with recurrent abscess since August 2018. She reports initial labial abscess 2 and subsequently in each axilla. She was treated with doxycycline for the entire month of October and completed her course. One week later she noted a recurrent abscess to her lower right rib cage midaxillary. The wound was cultured and revealed MRSA, she was started on Bactrim for 10 days since last Wednesday.  Now with generalized pruritic papular rash that started a month ago but is worsening over the past week. She reports a low-grade fever and chills, generalized myalgia and achy chest pain worse on deep inhale. She was seen by her PCP today who told her to come in to the emergency department. Also reports that she has been told to follow up with infectious disease. Denies any history of IV drug use or HIV. Rash does not involve the palms and soles.  HPI  Past Medical History:  Diagnosis Date  . Allergy   . Cancer ([place])    skin- hystory of melanoma   . Depression   . Endometriosis   . Fracture    right foot  . Frequent UTI   . History of shingles   . Interstitial cystitis   . Pap smear, abnormal    cryosx   . PID (acute pelvic inflammatory disease)    hx of  . Ureteral reflux    as a child     Patient Active Problem List   Diagnosis Date Noted  . Rash 09/20/2017  . Vaginal discharge 01/23/2015  . Dysuria 01/23/2015  . Screen for STD (sexually transmitted disease) 01/23/2015  . Plantar fasciitis 10/07/2014  . Acute sinusitis 08/31/2014  . MRSA (methicillin resistant Staphylococcus aureus) carrier 04/12/2014  . INTERSTITIAL CYSTITIS 06/25/2010  . ALLERGIC RHINITIS 03/22/2007  . SKIN CANCER, HX OF 03/22/2007    Past  Surgical History:  Procedure Laterality Date  . DILATION AND CURETTAGE OF UTERUS    . LAPAROSCOPY     pelvic   . MELANOMA EXCISION     Right leg   . TONSILLECTOMY      OB History    Gravida Para Term Preterm AB Living   2 0     2     SAB TAB Ectopic Multiple Live Births   1 1             Home Medications    Prior to Admission medications   Medication Sig Start Date End Date Taking? Authorizing Provider  ALPRAZolam Duanne Moron) 0.5 MG tablet Take 0.5 mg by mouth 2 (two) times daily as needed (bladder spazms).  03/21/14  Yes [provider]  ELMIRON 100 MG capsule Take 1 tablet by mouth at bedtime. 02/18/17  Yes [provider]  levonorgestrel-ethinyl estradiol (SEASONALE,INTROVALE,JOLESSA) 0.15-0.03 MG tablet Take 1 tablet at bedtime by mouth.    Yes [provider]  oxyCODONE-acetaminophen (PERCOCET) 10-325 MG tablet Take 1 tablet every 4 (four) hours as needed by mouth for pain.   Yes [provider]  sulfamethoxazole-trimethoprim (BACTRIM DS,SEPTRA DS) 800-160 MG tablet Take 1 tablet 2 (two) times daily by mouth.   Yes [provider]  hydrOXYzine (ATARAX/VISTARIL) 25 MG tablet Take 1 tablet (25 mg total) every 6 (  six) hours by mouth. 10/05/17   Emeline General, PA-C  ibuprofen (ADVIL,MOTRIN) 800 MG tablet Take 1 tablet (800 mg total) 3 (three) times daily by mouth. 10/05/17   Emeline General, PA-C    Family History Family History  Problem Relation Age of Onset  . Cancer Maternal Grandmother        breast    Social History Social History   Tobacco Use  . Smoking status: Former Smoker    Last attempt to quit: 02/04/2012    Years since quitting: 5.6  . Smokeless tobacco: Former Network engineer Use Topics  . Alcohol use: Yes    Alcohol/week: 0.6 oz    Types: 1 Standard drinks or equivalent per week    Comment: occassionally  . Drug use: No     Allergies   Cetirizine hcl and Vicodin  [hydrocodone-acetaminophen]   Review of Systems Review of Systems  Constitutional: Positive for chills and fever.  Eyes: Negative for photophobia, pain, redness and visual disturbance.  Respiratory: Negative for cough, shortness of breath, wheezing and stridor.   Cardiovascular: Positive for chest pain. Negative for palpitations.  Gastrointestinal: Negative for abdominal distention, abdominal pain, nausea and vomiting.  Genitourinary: Negative for difficulty urinating, dysuria and hematuria.  Musculoskeletal: Positive for myalgias. Negative for arthralgias, back pain, gait problem, joint swelling, neck pain and neck stiffness.  Skin: Positive for rash and wound. Negative for color change and pallor.  Neurological: Negative for dizziness, seizures, syncope, weakness, light-headedness and headaches.     Physical Exam Updated Vital Signs BP (!) 107/55   Pulse 71   Temp 98.5 F (36.9 C)   Resp 18   Ht 5\' 10"  (1.778 m)   Wt 64.4 kg (142 lb)   LMP 07/24/2017   SpO2 100%   BMI 20.37 kg/m   Physical Exam  Constitutional: She appears well-developed and well-nourished. No distress.  HENT:  Head: Normocephalic and atraumatic.  Eyes: Conjunctivae and EOM are normal.  Neck: Normal range of motion. Neck supple.  Cardiovascular: Normal rate, regular rhythm, normal heart sounds and intact distal pulses.  No murmur heard. Pulmonary/Chest: Effort normal and breath sounds normal. No stridor. No respiratory distress. She has no wheezes. She has no rales.  Abdominal: She exhibits no distension.  Musculoskeletal: Normal range of motion. She exhibits no edema or deformity.  Neurological: She is alert.  Skin: Skin is warm and dry. Capillary refill takes less than 2 seconds. Rash noted. She is not diaphoretic. No pallor.  Right mid-axillary lower chest wall draining abscess  Psychiatric: She has a normal mood and affect.  Nursing note and vitals reviewed.    ED Treatments / Results   Labs (all labs ordered are listed, but only abnormal results are displayed) Labs Reviewed  COMPREHENSIVE METABOLIC PANEL - Abnormal; Notable for the following components:      Result Value   Sodium 132 (*)    Calcium 8.5 (*)    All other components within normal limits  CULTURE, BLOOD (ROUTINE X 2)  CULTURE, BLOOD (ROUTINE X 2)  CBC WITH DIFFERENTIAL/PLATELET  CK TOTAL AND CKMB (NOT AT [facility])  I-STAT CG4 LACTIC ACID, ED  I-STAT BETA HCG BLOOD, ED ([facility], [facility], [facility] ONLY)  I-STAT CG4 LACTIC ACID, ED  I-STAT TROPONIN, ED    EKG  EKG Interpretation None       Radiology Dg Chest 2 View  Result Date: 10/05/2017 CLINICAL DATA:  Possible sepsis with fever and body aches. EXAM: CHEST  2  VIEW COMPARISON:  June 12, 2007 FINDINGS: The heart size and mediastinal contours are within normal limits. Both lungs are clear. The visualized skeletal structures are unremarkable. IMPRESSION: No active cardiopulmonary disease. Electronically Signed   By: Abelardo Diesel M.D.   On: 10/05/2017 19:19    Procedures Procedures (including critical care time) INCISION AND DRAINAGE Performed by: Emeline General Consent: Verbal consent obtained. Risks and benefits: risks, benefits and alternatives were discussed Type: abscess  Body area: right mid axillary chest wall  Anesthesia: local infiltration  Incision was made with a scalpel.  Local anesthetic: lidocaine 2% with epinephrine  Anesthetic total: 4 ml  Complexity: complex Blunt dissection to break up loculations  Drainage: purulent/bloody  Drainage amount: 71mL  Packing material: 1/4 in iodoform gauze  Patient tolerance: Patient tolerated the procedure well with no immediate complications.    Medications Ordered in ED Medications  hydrOXYzine (ATARAX/VISTARIL) tablet 10 mg (not administered)  sodium chloride 0.9 % bolus 1,000 mL (0 mLs Intravenous Stopped 10/05/17 1944)  diphenhydrAMINE (BENADRYL) capsule 25 mg (25 mg Oral Given  10/05/17 1945)  oxyCODONE-acetaminophen (PERCOCET/ROXICET) 5-325 MG per tablet 1 tablet (1 tablet Oral Given 10/05/17 1952)  lidocaine-EPINEPHrine (XYLOCAINE W/EPI) 2 %-1:200000 (PF) injection 20 mL (20 mLs Infiltration Given 10/05/17 2108)     Initial Impression / Assessment and Plan / ED Course  I have reviewed the triage vital signs and the nursing notes.  Pertinent labs & imaging results that were available during my care of the patient were reviewed by me and considered in my medical decision making (see chart for details).    Patient presents with recurrent abscesses and recently cultured MRSA wound, currently on Bactrim since last Wednesday for MRSA abscess.  Abscess was not I&D in office per patient. They extracted purulent discharge from wound to culture only. Presenting with subjective fever, chills, chest pain, generalized myalgias and pruritic papular rash without palms and sole involvement.  Patient is well-appearing, hemodynamically stable, afebrile nontoxic. Labs, chest x-ray and EKG unremarkable. Low suspicion for ACS or PE. Heart score:0 PERC negative  Patient with skin abscess amenable to incision and drainage.  Abscess was large enough to warrant packing,  wound recheck in 2 days. Encouraged home warm soaks and flushing.  No signs of cellulitis in surrounding skin.  Will d/c to home.   Dc home with symptomatic relief and close follow up. Discussed strict return precautions and advised to return to the emergency department if experiencing any new or worsening symptoms. Instructions were understood and patient agreed with discharge plan. Final Clinical Impressions(s) / ED Diagnoses   Final diagnoses:  Abscess  Rash  Other chest pain    ED Discharge Orders        Ordered    ibuprofen (ADVIL,MOTRIN) 800 MG tablet  3 times daily     10/05/17 2133    hydrOXYzine (ATARAX/VISTARIL) 25 MG tablet  Every 6 hours     10/05/17 2133       Dossie Der 10/05/17 2146    Forde Dandy, MD 10/05/17 [phone]

## 2017-10-05 NOTE — ED Triage Notes (Signed)
Pt reports being treated for staph infection with abx x 1 month. After one week of stopping medication pt developed another abscess to right mid back. PT reports low grade temp, chills, body aches at home. Pt states the abscess drained on its own at 0400 this morning and continues to drain. Sent in by md for further evaluation of infection

## 2017-10-08 ENCOUNTER — Ambulatory Visit: Payer: BLUE CROSS/BLUE SHIELD | Admitting: Family Medicine

## 2017-10-08 ENCOUNTER — Encounter: Payer: Self-pay | Admitting: Family Medicine

## 2017-10-08 VITALS — BP 114/70 | HR 99 | Temp 97.9°F | Wt 143.0 lb

## 2017-10-08 DIAGNOSIS — R21 Rash and other nonspecific skin eruption: Secondary | ICD-10-CM | POA: Diagnosis not present

## 2017-10-08 DIAGNOSIS — L02212 Cutaneous abscess of back [any part, except buttock]: Secondary | ICD-10-CM

## 2017-10-08 NOTE — Progress Notes (Signed)
Subjective:    Patient ID: Pamela Calhoun, female    DOB: [birth year], 31 y.o.   MRN: [id number]  HPI This is a 31 yo female, accompanied by her mother, who presents today for a wound check.  She has had problems with recurrent skin infections, positive for MRSA.  She was seen in the emergency room 3 days ago with an continued abscess of her right mid axillary rib cage.  She had been started on Bactrim for 10 days, the culture revealed MRSA.  She has completed course of Bactrim.  She was sent to the emergency room from this office because of increasing size, low-grade fever/chills and myalgias.  In the emergency room the abscess was incised and drained.  Packing was placed but fell out yesterday.  She has been changing the dressing.  Blood cultures were drawn and were negative.  A referral was put in for infectious disease.  She has not been contacted about an appointment yet.  She has not had any further fever/chills.  She does have a rash on her trunk that began prior to starting antibiotic.  It is pruritic.  Past Medical History:  Diagnosis Date  . Allergy   . Cancer ([place])    skin- hystory of melanoma   . Depression   . Endometriosis   . Fracture    right foot  . Frequent UTI   . History of shingles   . Interstitial cystitis   . Pap smear, abnormal    cryosx   . PID (acute pelvic inflammatory disease)    hx of  . Ureteral reflux    as a child    Past Surgical History:  Procedure Laterality Date  . DILATION AND CURETTAGE OF UTERUS    . EXCISION OF ANAL SKIN TAG AN INTERNAL PAPILLA N/A 02/11/2016   Performed by Leighton Ruff, MD at [facility]  . LAPAROSCOPY     pelvic   . MELANOMA EXCISION     Right leg   . TONSILLECTOMY     Family History  Problem Relation Age of Onset  . Cancer Maternal Grandmother        breast   Social History   Tobacco Use  . Smoking status: Former Smoker    Last attempt to quit: 02/04/2012    Years since quitting: 5.6  .  Smokeless tobacco: Former Network engineer Use Topics  . Alcohol use: Yes    Alcohol/week: 0.6 oz    Types: 1 Standard drinks or equivalent per week    Comment: occassionally  . Drug use: No      Review of Systems Per HPI    Objective:   Physical Exam  Constitutional: She is oriented to person, place, and time. She appears well-developed and well-nourished. No distress.  Thin  HENT:  Head: Normocephalic and atraumatic.  Cardiovascular: Normal rate.  Pulmonary/Chest: Effort normal.  Neurological: She is alert and oriented to person, place, and time.  Skin: Skin is warm and dry. She is not diaphoretic.     Appears to be resolving vesicles on upper chest scattered, non-erythematous.  Psychiatric: She has a normal mood and affect. Her behavior is normal. Judgment and thought content normal.  Vitals reviewed.     BP 114/70 (BP Location: Right Arm, Patient Position: Sitting, Cuff Size: Normal)   Pulse 99   Temp 97.9 F (36.6 C) (Oral)   Wt 143 lb (64.9 kg)   SpO2 99%   BMI 20.52 kg/m  Wt Readings from Last 3 Encounters:  10/08/17 143 lb (64.9 kg)  10/05/17 142 lb (64.4 kg)  10/05/17 141 lb 8 oz (64.2 kg)       Assessment & Plan:  1. Cutaneous abscess of back excluding buttocks - Provided written and verbal information regarding diagnosis and treatment. -Had our patient care coordinator reach out to infectious disease regarding appointment, also provided patient with number to call and check on appointment. -We will have her come back in 3 days (Monday), for dressing change and additional packing if needed  2. Rash -Does not appear to be getting new lesions, not sure of etiology -Suggested she take cetirizine for itching -May need dermatology referral if it does not clear up  Clarene Reamer, FNP-BC  [facility] at [facility] [place]  10/10/2017 5:00 PM

## 2017-10-08 NOTE — Patient Instructions (Signed)
Try cetirizine (generic Zyrtec) for itching   Wound Care, Adult Taking care of your wound properly can help to prevent pain and infection. It can also help your wound to heal more quickly. How is this treated? Wound care  Follow instructions from your health care provider about how to take care of your wound. Make sure you: ? Wash your hands with soap and water before you change the bandage (dressing). If soap and water are not available, use hand sanitizer. ? Change your dressing as told by your health care provider. ? Leave stitches (sutures), skin glue, or adhesive strips in place. These skin closures may need to stay in place for 2 weeks or longer. If adhesive strip edges start to loosen and curl up, you may trim the loose edges. Do not remove adhesive strips completely unless your health care provider tells you to do that.  Check your wound area every day for signs of infection. Check for: ? More redness, swelling, or pain. ? More fluid or blood. ? Warmth. ? Pus or a bad smell.  Ask your health care provider if you should clean the wound with mild soap and water. Doing this may include: ? Using a clean towel to pat the wound dry after cleaning it. Do not rub or scrub the wound. ? Applying a cream or ointment. Do this only as told by your health care provider. ? Covering the incision with a clean dressing.  Ask your health care provider when you can leave the wound uncovered. Medicines   If you were prescribed an antibiotic medicine, cream, or ointment, take or use the antibiotic as told by your health care provider. Do not stop taking or using the antibiotic even if your condition improves.  Take over-the-counter and prescription medicines only as told by your health care provider. If you were prescribed pain medicine, take it at least 30 minutes before doing any wound care or as told by your health care provider. General instructions  Return to your normal activities as told by  your health care provider. Ask your health care provider what activities are safe.  Do not scratch or pick at the wound.  Keep all follow-up visits as told by your health care provider. This is important.  Eat a diet that includes protein, vitamin A, vitamin C, and other nutrient-rich foods. These help the wound heal: ? Protein-rich foods include meat, dairy, beans, nuts, and other sources. ? Vitamin A-rich foods include carrots and dark green, leafy vegetables. ? Vitamin C-rich foods include citrus, tomatoes, and other fruits and vegetables. ? Nutrient-rich foods have protein, carbohydrates, fat, vitamins, or minerals. Eat a variety of healthy foods including vegetables, fruits, and whole grains. Contact a health care provider if:  You received a tetanus shot and you have swelling, severe pain, redness, or bleeding at the injection site.  Your pain is not controlled with medicine.  You have more redness, swelling, or pain around the wound.  You have more fluid or blood coming from the wound.  Your wound feels warm to the touch.  You have pus or a bad smell coming from the wound.  You have a fever or chills.  You are nauseous or you vomit.  You are dizzy. Get help right away if:  You have a red streak going away from your wound.  The edges of the wound open up and separate.  Your wound is bleeding and the bleeding does not stop with gentle pressure.  You have a  rash.  You faint.  You have trouble breathing. This information is not intended to replace advice given to you by your health care provider. Make sure you discuss any questions you have with your health care provider. Document Released: 08/18/2008 Document Revised: 07/08/2016 Document Reviewed: 05/26/2016 Elsevier Interactive Patient Education  2017 [employer].

## 2017-10-10 LAB — CULTURE, BLOOD (ROUTINE X 2)
Culture: NO GROWTH
Culture: NO GROWTH
SPECIAL REQUESTS: ADEQUATE
Special Requests: ADEQUATE

## 2017-10-11 ENCOUNTER — Ambulatory Visit: Payer: BLUE CROSS/BLUE SHIELD | Admitting: Family Medicine

## 2017-10-11 ENCOUNTER — Encounter: Payer: Self-pay | Admitting: Family Medicine

## 2017-10-11 VITALS — BP 112/74 | HR 87 | Temp 97.9°F | Wt 143.5 lb

## 2017-10-11 DIAGNOSIS — L02212 Cutaneous abscess of back [any part, except buttock]: Secondary | ICD-10-CM

## 2017-10-11 NOTE — Patient Instructions (Signed)
Remove dressing and packing in 2 days  Keep area clean and dry, keep covered while open  Send me a picture via mychart if you want me to check.

## 2017-10-11 NOTE — Progress Notes (Signed)
   Subjective:    Patient ID: Pamela Calhoun, female    DOB: 1986-08-24, 31 y.o.   MRN: [id number]  HPI This is a 31 yo female who presents today for follow up of wound. She was seen 3 days ago and wound was packed and dressed. She reports decreased redness and firmness of wound as well as decreased pain. She has not had any fever/chills. She is still waiting to hear from ID regarding an appointment.   Past Medical History:  Diagnosis Date  . Allergy   . Cancer ([place])    skin- hystory of melanoma   . Depression   . Endometriosis   . Fracture    right foot  . Frequent UTI   . History of shingles   . Interstitial cystitis   . Pap smear, abnormal    cryosx   . PID (acute pelvic inflammatory disease)    hx of  . Ureteral reflux    as a child    Past Surgical History:  Procedure Laterality Date  . DILATION AND CURETTAGE OF UTERUS    . EXCISION OF ANAL SKIN TAG AN INTERNAL PAPILLA N/A 02/11/2016   Performed by Leighton Ruff, MD at [facility]  . LAPAROSCOPY     pelvic   . MELANOMA EXCISION     Right leg   . TONSILLECTOMY     Family History  Problem Relation Age of Onset  . Cancer Maternal Grandmother        breast   Social History   Tobacco Use  . Smoking status: Former Smoker    Last attempt to quit: 02/04/2012    Years since quitting: 5.6  . Smokeless tobacco: Former Network engineer Use Topics  . Alcohol use: Yes    Alcohol/week: 0.6 oz    Types: 1 Standard drinks or equivalent per week    Comment: occassionally  . Drug use: No      Review of Systems Per HPI    Objective:   Physical Exam  Constitutional: She is oriented to person, place, and time. She appears well-developed and well-nourished.  HENT:  Head: Normocephalic and atraumatic.  Eyes: Conjunctivae are normal.  Cardiovascular: Normal rate.  Pulmonary/Chest: Effort normal.  Neurological: She is alert and oriented to person, place, and time.  Skin: Skin is warm and dry.  Dressing  and packing removed from wound. Size decreased from 3 days ago, no drainage. Irrigated with 3 cc lidocaine 1% and packing loosely placed. Wound dressed.   Psychiatric: She has a normal mood and affect. Her behavior is normal. Judgment and thought content normal.  Vitals reviewed.     BP 112/74 (BP Location: Right Arm, Patient Position: Sitting, Cuff Size: Normal)   Pulse 87   Temp 97.9 F (36.6 C) (Oral)   Wt 143 lb 8 oz (65.1 kg)   SpO2 98%   BMI 20.59 kg/m      Assessment & Plan:  1. Cutaneous abscess of back excluding buttocks - Improving, she was given instructions for further wound care- remove packing in 2 days, keep covered until closed - Discussed RTC precautions and advised her that she can send me a picture of wound through Mychart if she has questions.    Clarene Reamer, FNP-BC  [facility] at [facility]  10/13/2017 6:06 AM

## 2017-10-13 ENCOUNTER — Encounter: Payer: Self-pay | Admitting: Family Medicine

## 2017-10-26 ENCOUNTER — Ambulatory Visit (INDEPENDENT_AMBULATORY_CARE_PROVIDER_SITE_OTHER): Payer: BLUE CROSS/BLUE SHIELD | Admitting: Internal Medicine

## 2017-10-26 ENCOUNTER — Encounter: Payer: Self-pay | Admitting: Internal Medicine

## 2017-10-26 DIAGNOSIS — L732 Hidradenitis suppurativa: Secondary | ICD-10-CM

## 2017-10-26 MED ORDER — PERMETHRIN 5 % EX CREA: 1.0000 "application " | TOPICAL_CREAM | Freq: Once | CUTANEOUS | 2 refills | Status: AC

## 2017-10-26 NOTE — Progress Notes (Signed)
[facility]      Reason for Consult:rash and boils    Referring Physician: Dr. Helane Rima    Patient ID: Pamela Calhoun, female    DOB: [birth year], 31 y.o.   MRN: [id number]  HPI:   I saw her for a history of MRSA infection x 1 in 2015.  At the time, she did do decolonization and had no issues until about 4 months ago, started with a rash all over that is very pruritic.  She has developed several boils as well that have needed drainage, with one to the side of her right breast and now one in her left axilla.  She has had multiple boils in her bilateral groin area and axilla in the past.  She was told they were MRSA and a culture at the time did grow that.  She has been on prolonged courses of doxycycline, Bactrim and another antibiotic for the boils.  No associated fever or chills.  Past Medical History:  Diagnosis Date  . Allergy   . Cancer ([place])    skin- hystory of melanoma   . Depression   . Endometriosis   . Fracture    right foot  . Frequent UTI   . History of shingles   . Interstitial cystitis   . Pap smear, abnormal    cryosx   . PID (acute pelvic inflammatory disease)    hx of  . Ureteral reflux    as a child     Prior to Admission medications   Medication Sig Start Date End Date Taking? Authorizing Provider  ALPRAZolam Duanne Moron) 1 MG tablet  10/07/17  Yes [provider]  ELMIRON 100 MG capsule Take 1 tablet by mouth at bedtime. 02/18/17  Yes [provider]  hydrOXYzine (ATARAX/VISTARIL) 25 MG tablet Take 1 tablet (25 mg total) every 6 (six) hours by mouth. 10/05/17  Yes Emeline General, PA-C  levonorgestrel-ethinyl estradiol (SEASONALE,INTROVALE,JOLESSA) 0.15-0.03 MG tablet Take 1 tablet at bedtime by mouth.    Yes [provider]  ibuprofen (ADVIL,MOTRIN) 800 MG tablet Take 1 tablet (800 mg total) 3 (three) times daily by mouth. Patient not taking: Reported on 10/26/2017 10/05/17   Emeline General, PA-C    oxyCODONE-acetaminophen (PERCOCET) 10-325 MG tablet Take 1 tablet every 4 (four) hours as needed by mouth for pain.    [provider]  permethrin (ELIMITE) 5 % cream Apply 1 application topically once for 1 dose. Rub into body and leave on 8-14 hours; wash off in shower; may repeat x 2, weekly 10/26/17 10/26/17  Comer, Okey Regal, MD    Allergies  Allergen Reactions  . Cetirizine Hcl     REACTION: diarrhea  . Vicodin [Hydrocodone-Acetaminophen] Itching    Social History   Tobacco Use  . Smoking status: Former Smoker    Last attempt to quit: 02/04/2012    Years since quitting: 5.7  . Smokeless tobacco: Former Network engineer Use Topics  . Alcohol use: Yes    Alcohol/week: 0.6 oz    Types: 1 Standard drinks or equivalent per week    Comment: occassionally  . Drug use: No    Family History  Problem Relation Age of Onset  . Cancer Maternal Grandmother        breast    Review of Systems  Constitutional: negative for fatigue, malaise and anorexia Gastrointestinal: negative for diarrhea Musculoskeletal: negative for myalgias and arthralgias All other systems reviewed and are negative  Constitutional: in no apparent distress and alert  Vitals:   10/26/17 1354  BP: 106/69  Pulse: 83  Temp: 97.7 F (36.5 C)   EYES: anicteric ENMT: Cardiovascular: Cor RRR Respiratory: CTA B; normal respiratory effort GI: Bowel sounds are normal, liver is not enlarged, spleen is not enlarged Musculoskeletal: no pedal edema noted Skin: she has multiple small papules, erythematous and pruritic including between fingers; one boil on left axilla, healing boil on right side next to breast Hematologic: no cervical lad  Labs: Lab Results  Component Value Date   WBC 9.2 10/05/2017   HGB 13.0 10/05/2017   HCT 39.4 10/05/2017   MCV 94.0 10/05/2017   PLT 294 10/05/2017    Lab Results  Component Value Date   CREATININE 0.92 10/05/2017   BUN 12 10/05/2017   NA 132 (L) 10/05/2017    K 3.7 10/05/2017   CL 102 10/05/2017   CO2 23 10/05/2017    Lab Results  Component Value Date   ALT 14 10/05/2017   AST 16 10/05/2017   ALKPHOS 60 10/05/2017   BILITOT 0.4 10/05/2017     Assessment:  1) Rash - very itchy and not typical of boils, most c/w scabies.  I discussed with her the best course would be to treat her for scabies with permethrin first, may repeat x 2 and see if the rash improves.  I suspect the boils are a result of scratching and superinfection rather than recurrent MRSA boils.    2) boils - she has been doing hibiclens baths but I recommended, after #1, to try bleach baths as long as skin is intact.  There is no indication for oral antibiotics for boils for short or long durations, only drainage.  I have recommended not to take further antibiotic courses unless otherwise indicated.    3) hydradinitis suppurativa - I most suspect she has this rather than boils.  These have been in areas of sweat glands and recurrent. Previous MRSA culture from vulva infection but a wound swab, likely more related to skin flora than infection.  I will send her to dermatology for another opinion and consideration of treatment options which can be done after she does #1.    All questions answered and info given and she will call for further issues or concerns, otherwise return PRN

## 2017-10-26 NOTE — Patient Instructions (Signed)
[facility] Five day Treatment Plan for MRSA (Staph) Decolonization  Please let us know if you have questions or concerns or do not understand the information we give you.  Prepare Chose a period when you will be uninterrupted by going away or other distractions. To ensure that your skin is in good condition, follow the Routine Skin Care principles below to reduce drying and enhance healing. Do not start while you have any active boils. Routine Skin Care principles to reduce drying and enhance healing:  Avoid the use of soap when bathing or showering. DO NOT routinely use antiseptic solutions. If you need to use something, chose a soap substitute (examples -QV Wash or Cetaphil).   When drying with a towel, be gentle and pat dry your skin. Avoid rubbing the skin.   Reduce the overall frequency of bathing or showering. A short shower (3 minutes) is better than a bath in terms of its effect on the skin.   Use a simple sorbelene based-cream on your skin prior to showering and immediately after drying. (Examples: Hydroderm or other Sorbelene-based preparations). Especially protect healing or dry areas of skin in this way. Don't use a barrier cream with a vaseline base or with perfumes and additives. You are more likely to be allergic to these products, and cause further damage to your skin.   Make sure that you clean and cover any skin cuts or grazes that occur. Try to avoid picking or biting fingernails and the skin around the nails Keep your fingernails clipped short and clean to reduce problems caused by scratching.   For itchy skin, try gently massaging sorbelene-based cream into itchy areas instead of scratching it. A long-acting non-sedating antihistamine drug is the next option to try. However make sure that you are not taking medication that will interact with this drug type.                                                                         To prepare yourself  for your treatment, it is recommended that you complete the following steps:  Remove nose, ear and other body piercing items for several days prior to the treatment and keep them out during the treatment period   Purchase a new toothbrush, disposable razor (if used), sterident for dentures (if required) and a container of alcohol hand hygiene solution (gel or rub)   Discard old toothbrushes and razors when the treatment starts. Also discard opened deodorant rollers, skin adhesive tapes, skin creams and solutions- all of these may already be contaminated with staph   Discard pumice stone(s), sponges and disposable face cloths if used    Discard all make-up brushes, creams, and implements   Discard or hot wash all fluffy toys   Wash hair brush and comb, nail files, plastic toys and cutters in the dishwasher or purchase new ones   Remove nail varnish and artificial nails  Daily routine for 5 days     *Minimize contact with members of the community during the 5 days of treatment* Body washes The effectiveness of the program increases if the correct procedure is used:  Apply the provided antiseptic body wash (2% chlorhexidine) in the shower daily   Take   care to wash hair, under the arms and into the groin and into any folds of skin   Allow the antiseptic to remain on the skin for at least 5 minutes  Nasal ointment  Disinfect your hands with alcohol gel/rub and allow to dry    Open the mupirocin 2% (Bactroban) nasal ointment.   Place small amount (size of match head) of ointment onto a clean cotton swab and massage gently around the inside of the nostril on one side, making sure not to insert it too deeply (no more than 2 cm or a little less than an inch).   Use a new cotton bud for the other nostril so that you do not contaminate the Bactroban tube with staph.    After applying the ointment, press a finger against the nose next to the nostril opening and use a circular motion to  spread the ointment within the nose   Apply the mupirocin ointment two times a day for 5 days   Disinfect your hands with alcohol rub/gel after applying the ointmentp>  Personal items (combs, razor, eyeglasses, jewelry, etc.)     Disinfect all personal items daily with an alcohol-based cleanser  House Environment and Clothes/Linens  On day 2 and 5 of the treatment, clean your house, (especially the bedroom and bathroom). Clean dust off all surfaces and then vacuum clean all floor surfaces AND soft furnishings (such as your favorite chair). If your chair/couch has a vinyl or leather covering then wipe over the chair with warm soapy water and then dry with a clean towel (which should then be washed). Staph lives in skin scales from humans that contaminate the environment. This can lead to re-infection.    Disinfect the shower floor and/or bath tub daily    On days 1, 3, AND 5 of the treatment wash your clothes, underwear, pajamas and bed linen (such as towels, sheets, washcloths, and bath mats). A hot wash with laundry detergent is best (there is no need to use expensive laundry detergent or powder). Dry clothes in sun if possible. Change into clean clothes or pajamas on those days after your shower.    Do not share or exchange any personal items of clothing  Sports/Gym     Surfaces, equipment and towels, and skin-to-skin contact are all potential sources for staph re-infection Pets Dogs and other companion animals can also be colonized with the same strains of MRSA. Best to wash or replace bedding material for the animal and wash the animal at least once during the treatment period with antiseptic solution (2% chlorhexidine wash). Ensure that the skin of the animal is kept in good condition. If the pet has any chronic skin disorders, consult your vet prior to starting your treatment process. What about my partner, family, or household members? Usually when an aggressive strain of staph moves in  to a family or household, only certain members of that group get infections (boils). This is despite the fact that the strain has probably transferred itself from person to person within the group. Those without boils may also be carrying the bacterium, however they must have better resistance (immunity) or perhaps have better skin condition. Staph likes to invade through cuts, scratches and skin with dermatitis or dryness.    Follow-up after decolonization treatment  Possible approaches will vary depending on how your treatment goes. Your provider will instruct you on the follow-up best suited for you. Some options are:   Wait and see - if no further   boils occur within 6 months then it is probable that the strain of staph has been eliminated from you.    Continue intermittent body washes 1-2 times per week with 2% aqueous chlorhexidine soap preparation or similar   Future antibiotic use  The best preventative approach to avoiding future problems may be to avoid use of antibiotics unless there is a strong indication. Antibiotics are often prescribed for minor infections or for respiratory infections that are mostly due to viruses. It is in your best interest to ride these infections out rather than taking antibiotics. Taking antibiotics alters your natural bacterial flora on your skin and in your gut. This may reduce your resistance against acquiring a resistant bacterial strain such as MRSA).   Important note: if you do become very ill with possible infection and require hospital review, it is important for you to remind your medical care providers that you have been colonized with MRSA in the past as they may have to use antibiotics that are active against the strain that you had previously.   References Wiese-Posselt et al. Clin Inf Dis 2007:44:e88  CDC:  http://www.cdc.gov/ncidod/dhqp/ar_mrsa_ca.html   Bleach baths  Take a bleach baths twice a week for at least 15 minutes with any kind of  soap for 3 weeks. Bleach baths can be a good treatment for people who do not have broken skin or eczema. They can help prevent MRSA from coming back. Use lotion after the bath, because a bleach bath can dry out the skin.   . How much bleach to put in: an average full bathtub (adult level) holds about 40 gallons of water, add 1/2 cup bleach for each bleach bath.    

## 2017-10-28 ENCOUNTER — Telehealth: Payer: Self-pay | Admitting: *Deleted

## 2017-10-28 MED ORDER — IVERMECTIN 3 MG PO TABS: 200.0000 ug/kg | ORAL_TABLET | Freq: Once | ORAL | 1 refills | Status: AC

## 2017-10-28 NOTE — Telephone Encounter (Signed)
She can take ivermectin, 4 tabs once her pregnancy test is negative.  She should do the permethrin cream though as discussed as it is a better treatment for scabies.

## 2017-10-28 NOTE — Addendum Note (Signed)
Addended by: Thayer Headings on: 10/28/2017 04:45 PM   Modules accepted: Orders

## 2017-10-28 NOTE — Telephone Encounter (Signed)
Patient called requesting an oral medication for scabies. She can not take benadryl and she can not do bleach baths because she has open wounds from scratching. Her Dermatology appt is in January and I told her that is the first available for a new patient. She is fine with this but she would like oral med instead of the permethrin.

## 2017-10-29 ENCOUNTER — Other Ambulatory Visit: Payer: Self-pay | Admitting: *Deleted

## 2017-10-29 NOTE — Telephone Encounter (Signed)
Left patient a voice mail to return the call. If she does not come for lab prior to 12:00 pm today; it will have to wait until next week for her pregnancy test.

## 2017-10-29 NOTE — Telephone Encounter (Signed)
Cassie, pharmacist added the irvermectin and it is now in historical; awaiting the pregnancy test.

## 2017-11-12 ENCOUNTER — Telehealth: Payer: Self-pay | Admitting: *Deleted

## 2017-11-12 NOTE — Telephone Encounter (Signed)
Patient called and advised that she was treated for scabies and was given medication by Dr. Linus Salmons and her rash went away and is now returned. She stated, "I'm not sure if I didn't wash all my clothes; but now I have to suffer". Asked patient if she could possibly call her PCP office and request the Rx, Irvermectin; which she was given for treatment. She asked if I thought they would give it to her without being seen and I said I wasn't sure, but possibly. She said she called and left multiple voice mails and no one returned her call. Do not see any phone messages. Explained that our MD is away from the clinic and will not return until January 2019. She agreed to call her PCP.

## 2017-12-08 ENCOUNTER — Encounter: Payer: Self-pay | Admitting: Family Medicine

## 2017-12-08 ENCOUNTER — Ambulatory Visit: Payer: BLUE CROSS/BLUE SHIELD | Admitting: Family Medicine

## 2017-12-08 VITALS — BP 120/66 | HR 69 | Temp 98.1°F | Ht 70.0 in | Wt 142.0 lb

## 2017-12-08 DIAGNOSIS — N898 Other specified noninflammatory disorders of vagina: Secondary | ICD-10-CM | POA: Diagnosis not present

## 2017-12-08 DIAGNOSIS — R102 Pelvic and perineal pain unspecified side: Secondary | ICD-10-CM

## 2017-12-08 LAB — POCT WET PREP (WET MOUNT)
KOH Wet Prep POC: NEGATIVE
TRICHOMONAS WET PREP HPF POC: ABSENT

## 2017-12-08 LAB — POCT URINE PREGNANCY: PREG TEST UR: NEGATIVE

## 2017-12-08 MED ORDER — METRONIDAZOLE 0.75 % VA GEL: 1.0000 | Freq: Two times a day (BID) | VAGINAL | 0 refills | Status: DC

## 2017-12-08 NOTE — Progress Notes (Signed)
Subjective:    Patient ID: Pamela Calhoun, female    DOB: Jun 06, 1986, 32 y.o.   MRN: [id number]  HPI Here with c/o of pelvic pain and possible vaginitis   Now has odor and d/c (whilte)  No itching  Pelvic pain Is all the way across - feels like menstural cramping   LMP 14 d ago  She does get mid cycle pain sometimes even on the pill  No susp of STD at all   No chance pregnant    Has IC (messes with her bladder)   A little nausea for 3 d  No vomiting Diarrhea for a few days   Wt Readings from Last 3 Encounters:  12/08/17 142 lb (64.4 kg)  10/26/17 139 lb (63 kg)  10/11/17 143 lb 8 oz (65.1 kg)   Results for orders placed or performed in visit on 12/08/17  POCT Wet Prep [employer] [facility])  Result Value Ref Range   Source Wet Prep POC vaginal    WBC, Wet Prep HPF POC mod    Bacteria Wet Prep HPF POC Moderate (A) Few   BACTERIA WET PREP MORPHOLOGY POC     Clue Cells Wet Prep HPF POC Many (A) None   Clue Cells Wet Prep Whiff POC     Yeast Wet Prep HPF POC None    KOH Wet Prep POC neg    Trichomonas Wet Prep HPF POC Absent Absent  POCT urine pregnancy  Result Value Ref Range   Preg Test, Ur Negative Negative     Patient Active Problem List   Diagnosis Date Noted  . Pelvic pain 12/08/2017  . Hydradenitis 10/26/2017  . Rash 09/20/2017  . Vaginal discharge 01/23/2015  . Dysuria 01/23/2015  . Screen for STD (sexually transmitted disease) 01/23/2015  . Plantar fasciitis 10/07/2014  . MRSA (methicillin resistant Staphylococcus aureus) carrier 04/12/2014  . INTERSTITIAL CYSTITIS 06/25/2010  . ALLERGIC RHINITIS 03/22/2007  . SKIN CANCER, HX OF 03/22/2007   Past Medical History:  Diagnosis Date  . Allergy   . Cancer ([place])    skin- hystory of melanoma   . Depression   . Endometriosis   . Fracture    right foot  . Frequent UTI   . History of shingles   . Interstitial cystitis   . Pap smear, abnormal    cryosx   . PID (acute pelvic inflammatory disease)    hx of    . Ureteral reflux    as a child    Past Surgical History:  Procedure Laterality Date  . DILATION AND CURETTAGE OF UTERUS    . EXCISION OF SKIN TAG N/A 02/11/2016   Procedure: EXCISION OF ANAL SKIN TAG AN INTERNAL PAPILLA ;  Surgeon: Leighton Ruff, MD;  Location[facility];  Service: General;  Laterality: N/A;  . LAPAROSCOPY     pelvic   . MELANOMA EXCISION     Right leg   . TONSILLECTOMY     Social History   Tobacco Use  . Smoking status: Former Smoker    Last attempt to quit: 02/04/2012    Years since quitting: 5.8  . Smokeless tobacco: Former Network engineer Use Topics  . Alcohol use: Yes    Alcohol/week: 0.6 oz    Types: 1 Standard drinks or equivalent per week    Comment: occassionally  . Drug use: No   Family History  Problem Relation Age of Onset  . Cancer Maternal Grandmother  breast   Allergies  Allergen Reactions  . Cetirizine Hcl     REACTION: diarrhea  . Vicodin [Hydrocodone-Acetaminophen] Itching   Current Outpatient Medications on File Prior to Visit  Medication Sig Dispense Refill  . ALPRAZolam (XANAX) 1 MG tablet   0  . ELMIRON 100 MG capsule Take 1 tablet by mouth at bedtime.  11  . hydrOXYzine (ATARAX/VISTARIL) 25 MG tablet Take 1 tablet (25 mg total) every 6 (six) hours by mouth. 12 tablet 0  . ibuprofen (ADVIL,MOTRIN) 800 MG tablet Take 1 tablet (800 mg total) 3 (three) times daily by mouth. 21 tablet 0  . levonorgestrel-ethinyl estradiol (SEASONALE,INTROVALE,JOLESSA) 0.15-0.03 MG tablet Take 1 tablet at bedtime by mouth.      No current facility-administered medications on file prior to visit.     Review of Systems  Constitutional: Negative for activity change, appetite change, fatigue, fever and unexpected weight change.  HENT: Negative for congestion, ear pain, rhinorrhea, sinus pressure and sore throat.   Eyes: Negative for pain, redness and visual disturbance.  Respiratory: Negative for cough, shortness of breath and  wheezing.   Cardiovascular: Negative for chest pain and palpitations.  Gastrointestinal: Negative for abdominal pain, blood in stool, constipation and diarrhea.  Endocrine: Negative for polydipsia and polyuria.  Genitourinary: Positive for pelvic pain and vaginal discharge. Negative for dysuria, flank pain, frequency, genital sores, hematuria, urgency and vaginal bleeding.  Musculoskeletal: Negative for arthralgias, back pain and myalgias.  Skin: Negative for pallor and rash.  Allergic/Immunologic: Negative for environmental allergies.  Neurological: Negative for dizziness, syncope and headaches.  Hematological: Negative for adenopathy. Does not bruise/bleed easily.  Psychiatric/Behavioral: Negative for decreased concentration and dysphoric mood. The patient is not nervous/anxious.        Objective:   Physical Exam  Constitutional: She appears well-developed and well-nourished. No distress.  HENT:  Head: Normocephalic and atraumatic.  Mouth/Throat: Oropharynx is clear and moist.  Eyes: Conjunctivae and EOM are normal. Pupils are equal, round, and reactive to light. Right eye exhibits no discharge. Left eye exhibits no discharge. No scleral icterus.  Neck: Normal range of motion. Neck supple.  Cardiovascular: Normal rate and normal heart sounds.  Pulmonary/Chest: Effort normal and breath sounds normal.  Abdominal: There is tenderness. There is no rebound and no guarding.  Mild midline suprapubic tenderness w/o rebound or guarding   Genitourinary: Vaginal discharge found.  Genitourinary Comments: Mild injection of labia minora w/o lesions White vaginal d/c with odor  No CMT on bimanual exam  No M noted   Wet prep pos for clue cells  Musculoskeletal: She exhibits no edema.  Lymphadenopathy:    She has no cervical adenopathy.  Neurological: She is alert.  Skin: Skin is warm and dry. No rash noted. No erythema. No pallor.  Psychiatric: She has a normal mood and affect.            Assessment & Plan:   Problem List Items Addressed This Visit      Other   Pelvic pain    With vaginal d/c and odor and wet prep pos for BV tx with metrogel vaginal  Neg upreg  Unsure if nausea related  Pending gc/chlamyd tests (unlikely)   Update if not starting to improve in a week or if worsening        Relevant Orders   POCT urine pregnancy (Completed)   C. trachomatis/N. gonorrhoeae RNA   Vaginal discharge - Primary    Wet prep pos for BV Odor noted  Has  had this before  tx with metrogel vaginal  Gc/chl probe pending Update if not starting to improve in a week or if worsening        Relevant Orders   POCT Wet Prep [facility]) (Completed)   C. trachomatis/N. gonorrhoeae RNA

## 2017-12-08 NOTE — Patient Instructions (Signed)
Take care of yourself  Use the metro gel vaginal gel twice daily as px Alert me if not improved  Also if nausea or diarrhea worsen

## 2017-12-09 LAB — C. TRACHOMATIS/N. GONORRHOEAE RNA
C. trachomatis RNA, TMA: NOT DETECTED
N. GONORRHOEAE RNA, TMA: NOT DETECTED

## 2017-12-09 NOTE — Assessment & Plan Note (Signed)
With vaginal d/c and odor and wet prep pos for BV tx with metrogel vaginal  Neg upreg  Unsure if nausea related  Pending gc/chlamyd tests (unlikely)   Update if not starting to improve in a week or if worsening

## 2017-12-09 NOTE — Assessment & Plan Note (Signed)
Wet prep pos for BV Odor noted  Has had this before  tx with metrogel vaginal  Gc/chl probe pending Update if not starting to improve in a week or if worsening

## 2018-01-11 ENCOUNTER — Encounter: Payer: Self-pay | Admitting: Internal Medicine

## 2018-01-11 ENCOUNTER — Ambulatory Visit: Payer: BLUE CROSS/BLUE SHIELD | Admitting: Internal Medicine

## 2018-01-11 VITALS — BP 110/76 | HR 98 | Temp 98.4°F | Wt 138.0 lb

## 2018-01-11 DIAGNOSIS — J0110 Acute frontal sinusitis, unspecified: Secondary | ICD-10-CM | POA: Diagnosis not present

## 2018-01-11 MED ORDER — PREDNISONE 10 MG PO TABS: ORAL_TABLET | ORAL | 0 refills | Status: DC

## 2018-01-11 MED ORDER — AMOXICILLIN 500 MG PO CAPS: 500.0000 mg | ORAL_CAPSULE | Freq: Three times a day (TID) | ORAL | 0 refills | Status: DC

## 2018-01-11 MED ORDER — HYDROCOD POLST-CPM POLST ER 10-8 MG/5ML PO SUER: 5.0000 mL | Freq: Every evening | ORAL | 0 refills | Status: DC | PRN

## 2018-01-11 NOTE — Progress Notes (Signed)
HPI  Pt presents to the clinic today with c/o sinus pain and pressure, ear pain, nasal congestion ,sore throat and cough. This started 3 weeks ago. She is blowing yellow/green mucous out of her nose. She describes the ear pain as fullness, which just developed 1 week ago. She denies difficulty swallowing. The cough is productive of yellow/green mucous. She reports fever, chills and body aches. She has tried Mucinex, Copywriter, advertising, Ibuprofen, Flonase and Claritin with some relief. She has a history of allergies. She has not had sick contacts.  Review of Systems     Past Medical History:  Diagnosis Date  . Allergy   . Cancer ([place])    skin- hystory of melanoma   . Depression   . Endometriosis   . Fracture    right foot  . Frequent UTI   . History of shingles   . Interstitial cystitis   . Pap smear, abnormal    cryosx   . PID (acute pelvic inflammatory disease)    hx of  . Ureteral reflux    as a child     Family History  Problem Relation Age of Onset  . Cancer Maternal Grandmother        breast    Social History   Socioeconomic History  . Marital status: Single    Spouse name: Not on file  . Number of children: Not on file  . Years of education: Not on file  . Highest education level: Not on file  Social Needs  . Financial resource strain: Not on file  . Food insecurity - worry: Not on file  . Food insecurity - inability: Not on file  . Transportation needs - medical: Not on file  . Transportation needs - non-medical: Not on file  Occupational History  . Not on file  Tobacco Use  . Smoking status: Former Smoker    Last attempt to quit: 02/04/2012    Years since quitting: 5.9  . Smokeless tobacco: Former Network engineer and Sexual Activity  . Alcohol use: Yes    Alcohol/week: 0.6 oz    Types: 1 Standard drinks or equivalent per week    Comment: occassionally  . Drug use: No  . Sexual activity: Not on file  Other Topics Concern  . Not on file  Social History  Narrative  . Not on file    Allergies  Allergen Reactions  . Cetirizine Hcl     REACTION: diarrhea  . Vicodin [Hydrocodone-Acetaminophen] Itching     Constitutional: Positive headache, fatigue and fever. Denies abrupt weight changes.  HEENT:  Positive eye pain, facial pain, nasal congestion and sore throat. Denies eye redness, ear pain, ringing in the ears, wax buildup, runny nose or bloody nose. Respiratory: Positive cough. Denies difficulty breathing or shortness of breath.  Cardiovascular: Denies chest pain, chest tightness, palpitations or swelling in the hands or feet.   No other specific complaints in a complete review of systems (except as listed in HPI above).  Objective:  BP 110/76   Pulse 98   Temp 98.4 F (36.9 C) (Oral)   Wt 138 lb (62.6 kg)   SpO2 99%   BMI 19.80 kg/m   General: Appears her stated age, ill appearing, in NAD. HEENT: Head: normal shape and size, frontal and maxillary sinus tenderness noted; Ears: Tm's red but intact, normal light reflex; Nose: mucosa boggy and moist, septum midline; Throat/Mouth: + PND. Teeth present, mucosa erythematous and moist, no exudate noted, no lesions or ulcerations  noted.  Neck: Bilateral anterior cervical adenopathy noted.  Cardiovascular: Normal rate and rhythm. S1,S2 noted.  No murmur, rubs or gallops noted.  Pulmonary/Chest: Normal effort and positive vesicular breath sounds. No respiratory distress. No wheezes, rales or ronchi noted.       Assessment & Plan:   Acute Frontal Sinusitis:  Can use a Neti Pot which can be purchased from your local drug store. Flonase 2 sprays each nostril for 3 days and then as needed. eRx for Amoxil TID for 10 days eRx for Pred Taper x 6 days eRx for Tussionex for cough- sedation caution given  RTC as needed or if symptoms persist. Webb Silversmith, NP

## 2018-01-11 NOTE — Patient Instructions (Signed)

## 2018-03-23 ENCOUNTER — Ambulatory Visit (INDEPENDENT_AMBULATORY_CARE_PROVIDER_SITE_OTHER): Payer: BLUE CROSS/BLUE SHIELD

## 2018-03-23 ENCOUNTER — Encounter: Payer: BLUE CROSS/BLUE SHIELD | Admitting: Family Medicine

## 2018-03-23 DIAGNOSIS — Z111 Encounter for screening for respiratory tuberculosis: Secondary | ICD-10-CM | POA: Diagnosis not present

## 2018-03-24 ENCOUNTER — Ambulatory Visit (INDEPENDENT_AMBULATORY_CARE_PROVIDER_SITE_OTHER): Payer: BLUE CROSS/BLUE SHIELD | Admitting: Family Medicine

## 2018-03-24 ENCOUNTER — Encounter: Payer: Self-pay | Admitting: Family Medicine

## 2018-03-24 VITALS — BP 118/64 | HR 67 | Temp 98.0°F | Ht 68.5 in | Wt 141.2 lb

## 2018-03-24 DIAGNOSIS — Z0000 Encounter for general adult medical examination without abnormal findings: Secondary | ICD-10-CM

## 2018-03-24 DIAGNOSIS — J301 Allergic rhinitis due to pollen: Secondary | ICD-10-CM

## 2018-03-24 LAB — CBC WITH DIFFERENTIAL/PLATELET
BASOS ABS: 0 10*3/uL (ref 0.0–0.1)
Basophils Relative: 0.4 % (ref 0.0–3.0)
EOS ABS: 0.3 10*3/uL (ref 0.0–0.7)
Eosinophils Relative: 4.6 % (ref 0.0–5.0)
HEMATOCRIT: 39.3 % (ref 36.0–46.0)
HEMOGLOBIN: 13.4 g/dL (ref 12.0–15.0)
LYMPHS PCT: 44.4 % (ref 12.0–46.0)
Lymphs Abs: 3.1 10*3/uL (ref 0.7–4.0)
MCHC: 34.1 g/dL (ref 30.0–36.0)
MCV: 92.4 fl (ref 78.0–100.0)
MONOS PCT: 6.4 % (ref 3.0–12.0)
Monocytes Absolute: 0.5 10*3/uL (ref 0.1–1.0)
Neutro Abs: 3.1 10*3/uL (ref 1.4–7.7)
Neutrophils Relative %: 44.2 % (ref 43.0–77.0)
Platelets: 302 10*3/uL (ref 150.0–400.0)
RBC: 4.25 Mil/uL (ref 3.87–5.11)
RDW: 12.4 % (ref 11.5–15.5)
WBC: 7.1 10*3/uL (ref 4.0–10.5)

## 2018-03-24 LAB — LIPID PANEL
CHOLESTEROL: 182 mg/dL (ref 0–200)
HDL: 57.9 mg/dL (ref >39.00)
LDL Cholesterol: 105 mg/dL — ABNORMAL HIGH (ref 0–99)
NonHDL: 124.12
TRIGLYCERIDES: 98 mg/dL (ref 0.0–149.0)
Total CHOL/HDL Ratio: 3
VLDL: 19.6 mg/dL (ref 0.0–40.0)

## 2018-03-24 LAB — COMPREHENSIVE METABOLIC PANEL
ALBUMIN: 4.2 g/dL (ref 3.5–5.2)
ALK PHOS: 49 U/L (ref 39–117)
ALT: 8 U/L (ref 0–35)
AST: 13 U/L (ref 0–37)
BILIRUBIN TOTAL: 0.4 mg/dL (ref 0.2–1.2)
BUN: 14 mg/dL (ref 6–23)
CALCIUM: 9.5 mg/dL (ref 8.4–10.5)
CO2: 26 mEq/L (ref 19–32)
CREATININE: 0.79 mg/dL (ref 0.40–1.20)
Chloride: 104 mEq/L (ref 96–112)
GFR: 88.63 mL/min (ref >60.00)
Glucose, Bld: 88 mg/dL (ref 70–99)
Potassium: 4.3 mEq/L (ref 3.5–5.1)
Sodium: 138 mEq/L (ref 135–145)
TOTAL PROTEIN: 7.3 g/dL (ref 6.0–8.3)

## 2018-03-24 LAB — TSH: TSH: 1.46 u[IU]/mL (ref 0.35–4.50)

## 2018-03-24 NOTE — Progress Notes (Signed)
Subjective:    Patient ID: Pamela Calhoun, female    DOB: Oct 30, 1986, 32 y.o.   MRN: [id number]  HPI Here for employment exam  Had her PPD was yesterday- coming in tomorrow to have it checked   Change jobs back to [employer] - starts next Monday  Excited about the change  Has been doing well    Wt Readings from Last 3 Encounters:  03/24/18 141 lb 4 oz (64.1 kg)  01/11/18 138 lb (62.6 kg)  12/08/17 142 lb (64.4 kg)  eating well  Exercises 3 times per weeks  21.16 kg/m    Pap/gyn care - last one was in July with gyn  No problems with vaginitis  seasonale controls periods for the most part - some breakthrough bleeding  No plans for pregnancy  Self breast  exam  Is planning a baseline mammogram with her gyn this year  Breast cancer in family- MGM and MGGM  Tetanus shot 11/14   Flu shot - was in the fall   HIV screen 5/15 nl   Derm f/u (remote hx of melanoma) No boil in 6 mo (hydradenitis)    Labs- wellness labs for today  Patient Active Problem List   Diagnosis Date Noted  . Routine general medical examination at a health care facility 03/24/2018  . Hydradenitis 10/26/2017  . Screen for STD (sexually transmitted disease) 01/23/2015  . MRSA (methicillin resistant Staphylococcus aureus) carrier 04/12/2014  . INTERSTITIAL CYSTITIS 06/25/2010  . Allergic rhinitis 03/22/2007  . SKIN CANCER, HX OF 03/22/2007   Past Medical History:  Diagnosis Date  . Allergy   . Cancer ([place])    skin- hystory of melanoma   . Depression   . Endometriosis   . Fracture    right foot  . Frequent UTI   . History of shingles   . Interstitial cystitis   . Pap smear, abnormal    cryosx   . PID (acute pelvic inflammatory disease)    hx of  . Ureteral reflux    as a child    Past Surgical History:  Procedure Laterality Date  . DILATION AND CURETTAGE OF UTERUS    . EXCISION OF SKIN TAG N/A 02/11/2016   Procedure: EXCISION OF ANAL SKIN TAG AN INTERNAL PAPILLA ;  Surgeon: Leighton Ruff, MD;  Location: [facility];  Service: General;  Laterality: N/A;  . LAPAROSCOPY     pelvic   . MELANOMA EXCISION     Right leg   . TONSILLECTOMY     Social History   Tobacco Use  . Smoking status: Former Smoker    Last attempt to quit: 02/04/2012    Years since quitting: 6.1  . Smokeless tobacco: Former Network engineer Use Topics  . Alcohol use: Yes    Alcohol/week: 0.6 oz    Types: 1 Standard drinks or equivalent per week    Comment: occassionally  . Drug use: No   Family History  Problem Relation Age of Onset  . Cancer Maternal Grandmother        breast   Allergies  Allergen Reactions  . Cetirizine Hcl     REACTION: diarrhea  . Vicodin [Hydrocodone-Acetaminophen] Itching   Current Outpatient Medications on File Prior to Visit  Medication Sig Dispense Refill  . ALPRAZolam (XANAX) 1 MG tablet   0  . ELMIRON 100 MG capsule Take 1 tablet by mouth at bedtime.  11  . hydrOXYzine (ATARAX/VISTARIL) 25 MG tablet Take 1 tablet (  25 mg total) every 6 (six) hours by mouth. 12 tablet 0  . levonorgestrel-ethinyl estradiol (SEASONALE,INTROVALE,JOLESSA) 0.15-0.03 MG tablet Take 1 tablet at bedtime by mouth.      No current facility-administered medications on file prior to visit.     Review of Systems  Constitutional: Negative for activity change, appetite change, fatigue, fever and unexpected weight change.  HENT: Negative for congestion, ear pain, rhinorrhea, sinus pressure and sore throat.   Eyes: Negative for pain, redness and visual disturbance.  Respiratory: Negative for cough, shortness of breath and wheezing.   Cardiovascular: Negative for chest pain and palpitations.  Gastrointestinal: Negative for abdominal pain, blood in stool, constipation and diarrhea.  Endocrine: Negative for polydipsia and polyuria.  Genitourinary: Negative for dysuria, frequency and urgency.  Musculoskeletal: Negative for arthralgias, back pain and myalgias.  Skin: Negative  for pallor and rash.  Allergic/Immunologic: Negative for environmental allergies.  Neurological: Negative for dizziness, syncope and headaches.  Hematological: Negative for adenopathy. Does not bruise/bleed easily.  Psychiatric/Behavioral: Negative for decreased concentration and dysphoric mood. The patient is not nervous/anxious.        Objective:   Physical Exam  Constitutional: She appears well-developed and well-nourished. No distress.  Well appearing   HENT:  Head: Normocephalic and atraumatic.  Right Ear: External ear normal.  Left Ear: External ear normal.  Nose: Nose normal.  Mouth/Throat: Oropharynx is clear and moist.  Boggy nares-not congested   Eyes: Pupils are equal, round, and reactive to light. Conjunctivae and EOM are normal. Right eye exhibits no discharge. Left eye exhibits no discharge. No scleral icterus.  Neck: Normal range of motion. Neck supple. No JVD present. Carotid bruit is not present. No thyromegaly present.  Cardiovascular: Normal rate, regular rhythm, normal heart sounds and intact distal pulses. Exam reveals no gallop.  Pulmonary/Chest: Effort normal and breath sounds normal. No respiratory distress. She has no wheezes. She has no rales.  Abdominal: Soft. Bowel sounds are normal. She exhibits no distension and no mass. There is no tenderness.  Genitourinary:  Genitourinary Comments: Sees gyn for exam  Musculoskeletal: She exhibits no edema or tenderness.  Lymphadenopathy:    She has no cervical adenopathy.  Neurological: She is alert. She has normal reflexes. No cranial nerve deficit. She exhibits normal muscle tone. Coordination normal.  Skin: Skin is warm and dry. No rash noted. No erythema. No pallor.  Solar lentigines diffusely   Psychiatric: She has a normal mood and affect.  Cheerful           Assessment & Plan:   Problem List Items Addressed This Visit      Respiratory   Allergic rhinitis    Allegra and claritin not working well    Will try zyrtec 10 mg daily  Also steroid ns daily   If no further imp can consider singulaire Disc pollen avoidance         Other   Routine general medical examination at a health care facility - Primary    Reviewed health habits including diet and exercise and skin cancer prevention Reviewed appropriate screening tests for age  Also reviewed health mt list, fam hx and immunization status , as well as social and family history   Doing well  No restrictions for teaching (will have PPD read tomorrow and return paperwork) Good health habits Wellness labs today  Plans to get baseline mammogram Continue gyn care  Reminded to get yearly skin screen at derm for hx of melanoma  Relevant Orders   CBC with Differential/Platelet   Comprehensive metabolic panel   Lipid panel   TSH

## 2018-03-24 NOTE — Assessment & Plan Note (Signed)
Allegra and claritin not working well   Will try zyrtec 10 mg daily  Also steroid ns daily   If no further imp can consider singulaire Disc pollen avoidance

## 2018-03-24 NOTE — Assessment & Plan Note (Signed)
Reviewed health habits including diet and exercise and skin cancer prevention Reviewed appropriate screening tests for age  Also reviewed health mt list, fam hx and immunization status , as well as social and family history   Doing well  No restrictions for teaching (will have PPD read tomorrow and return paperwork) Good health habits Wellness labs today  Plans to get baseline mammogram Continue gyn care  Reminded to get yearly skin screen at derm for hx of melanoma

## 2018-03-24 NOTE — Patient Instructions (Addendum)
Check in with dermatology once a year for skin screen   Get your PPD checked tomorrow   For allergies - try zyrtec 10 mg daily  Also flonase or nasacort every day   Keep exercising Wear sun block Eat healthy   Labs today

## 2018-03-25 ENCOUNTER — Encounter: Payer: Self-pay | Admitting: *Deleted

## 2018-04-05 ENCOUNTER — Ambulatory Visit (INDEPENDENT_AMBULATORY_CARE_PROVIDER_SITE_OTHER)
Admission: RE | Admit: 2018-04-05 | Discharge: 2018-04-05 | Disposition: A | Discharge status: Home / Self Care | Payer: BLUE CROSS/BLUE SHIELD | Source: Ambulatory Visit | Attending: Internal Medicine | Admitting: Internal Medicine

## 2018-04-05 ENCOUNTER — Encounter: Payer: Self-pay | Admitting: Internal Medicine

## 2018-04-05 ENCOUNTER — Ambulatory Visit: Payer: BLUE CROSS/BLUE SHIELD | Admitting: Internal Medicine

## 2018-04-05 VITALS — BP 100/72 | HR 81 | Temp 97.5°F | Resp 12 | Ht 68.5 in | Wt 144.0 lb

## 2018-04-05 DIAGNOSIS — R05 Cough: Secondary | ICD-10-CM

## 2018-04-05 DIAGNOSIS — R059 Cough, unspecified: Secondary | ICD-10-CM

## 2018-04-05 MED ORDER — AMOXICILLIN 500 MG PO TABS: 1000.0000 mg | ORAL_TABLET | Freq: Two times a day (BID) | ORAL | 0 refills | Status: AC

## 2018-04-05 NOTE — Progress Notes (Signed)
Subjective:    Patient ID: Pamela Calhoun, female    DOB: [birth year], 32 y.o.   MRN: [id number]  HPI Here due to respiratory symptoms  Sick for 2 days Cough started about 1 week ago--related to sinus and allergies (takes allegra and flonase) Now achy since yesterday Very tired 2 days ago [employer] to work yesterday-- but felt bad. Sent home with fever to 101.3  Has pressure on chest at night Some chills and sweats Cough productive of clear to yellow mucus Not SOB No pleuritic pain  Taking tylenol and advil Cold remedy also  Current Outpatient Medications on File Prior to Visit  Medication Sig Dispense Refill  . ALPRAZolam (XANAX) 1 MG tablet   0  . ELMIRON 100 MG capsule Take 1 tablet by mouth at bedtime.  11  . hydrOXYzine (ATARAX/VISTARIL) 25 MG tablet Take 1 tablet (25 mg total) every 6 (six) hours by mouth. 12 tablet 0  . levonorgestrel-ethinyl estradiol (SEASONALE,INTROVALE,JOLESSA) 0.15-0.03 MG tablet Take 1 tablet at bedtime by mouth.      No current facility-administered medications on file prior to visit.     Allergies  Allergen Reactions  . Cetirizine Hcl     REACTION: diarrhea  . Vicodin [Hydrocodone-Acetaminophen] Itching    Past Medical History:  Diagnosis Date  . Allergy   . Cancer ([place])    skin- hystory of melanoma   . Depression   . Endometriosis   . Fracture    right foot  . Frequent UTI   . History of shingles   . Interstitial cystitis   . Pap smear, abnormal    cryosx   . PID (acute pelvic inflammatory disease)    hx of  . Ureteral reflux    as a child     Past Surgical History:  Procedure Laterality Date  . DILATION AND CURETTAGE OF UTERUS    . EXCISION OF SKIN TAG N/A 02/11/2016   Procedure: EXCISION OF ANAL SKIN TAG AN INTERNAL PAPILLA ;  Surgeon: Leighton Ruff, MD;  Location: [facility];  Service: General;  Laterality: N/A;  . LAPAROSCOPY     pelvic   . MELANOMA EXCISION     Right leg   . TONSILLECTOMY       Family History  Problem Relation Age of Onset  . Cancer Maternal Grandmother        breast    Social History   Socioeconomic History  . Marital status: Single    Spouse name: Not on file  . Number of children: Not on file  . Years of education: Not on file  . Highest education level: Not on file  Occupational History  . Not on file  Social Needs  . Financial resource strain: Not on file  . Food insecurity:    Worry: Not on file    Inability: Not on file  . Transportation needs:    Medical: Not on file    Non-medical: Not on file  Tobacco Use  . Smoking status: Former Smoker    Last attempt to quit: 02/04/2012    Years since quitting: 6.1  . Smokeless tobacco: Former Network engineer and Sexual Activity  . Alcohol use: Yes    Alcohol/week: 0.6 oz    Types: 1 Standard drinks or equivalent per week    Comment: occassionally  . Drug use: No  . Sexual activity: Not on file  Lifestyle  . Physical activity:    Days per week: Not on file  Minutes per session: Not on file  . Stress: Not on file  Relationships  . Social connections:    Talks on phone: Not on file    Gets together: Not on file    Attends religious service: Not on file    Active member of club or organization: Not on file    Attends meetings of clubs or organizations: Not on file    Relationship status: Not on file  . Intimate partner violence:    Fear of current or ex partner: Not on file    Emotionally abused: Not on file    Physically abused: Not on file    Forced sexual activity: Not on file  Other Topics Concern  . Not on file  Social History Narrative  . Not on file   Review of Systems No vomiting or diarrhea Appetite is off No rash    Objective:   Physical Exam  Constitutional:  Looks mildly ill appearing but no distress  HENT:  Slight pharyngeal injection without exudate No sinus tenderness TMs normal Marked pale nasal congestion  Neck:  Small non tender anterior cervical  nodes  Pulmonary/Chest: Effort normal and breath sounds normal. No stridor. No respiratory distress. She has no wheezes. She has no rales.  No dullness to percussion          Assessment & Plan:

## 2018-04-05 NOTE — Assessment & Plan Note (Addendum)
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

## 2018-04-05 NOTE — Patient Instructions (Signed)
Please start the amoxicillin if your sinus symptoms are worsening over the next few days.

## 2018-06-01 DIAGNOSIS — R87610 Atypical squamous cells of undetermined significance on cytologic smear of cervix (ASC-US): Secondary | ICD-10-CM | POA: Insufficient documentation

## 2018-07-07 ENCOUNTER — Encounter: Payer: Self-pay | Admitting: Family Medicine

## 2018-07-07 ENCOUNTER — Other Ambulatory Visit (HOSPITAL_COMMUNITY)
Admission: RE | Admit: 2018-07-07 | Discharge: 2018-07-07 | Disposition: A | Discharge status: Home / Self Care | Payer: BLUE CROSS/BLUE SHIELD | Source: Ambulatory Visit | Attending: Family Medicine | Admitting: Family Medicine

## 2018-07-07 ENCOUNTER — Ambulatory Visit: Payer: BLUE CROSS/BLUE SHIELD | Admitting: Family Medicine

## 2018-07-07 VITALS — BP 98/78 | HR 96 | Temp 98.0°F | Resp 16 | Wt 150.1 lb

## 2018-07-07 DIAGNOSIS — N898 Other specified noninflammatory disorders of vagina: Secondary | ICD-10-CM

## 2018-07-07 DIAGNOSIS — R070 Pain in throat: Secondary | ICD-10-CM

## 2018-07-07 DIAGNOSIS — Z01411 Encounter for gynecological examination (general) (routine) with abnormal findings: Secondary | ICD-10-CM | POA: Diagnosis not present

## 2018-07-07 DIAGNOSIS — N760 Acute vaginitis: Secondary | ICD-10-CM

## 2018-07-07 DIAGNOSIS — J029 Acute pharyngitis, unspecified: Secondary | ICD-10-CM

## 2018-07-07 DIAGNOSIS — B9689 Other specified bacterial agents as the cause of diseases classified elsewhere: Secondary | ICD-10-CM

## 2018-07-07 LAB — POCT RAPID STREP A (OFFICE): Rapid Strep A Screen: NEGATIVE

## 2018-07-07 MED ORDER — METRONIDAZOLE 500 MG PO TABS: 500.0000 mg | ORAL_TABLET | Freq: Two times a day (BID) | ORAL | 0 refills | Status: AC

## 2018-07-07 MED ORDER — FLUCONAZOLE 150 MG PO TABS: ORAL_TABLET | ORAL | 0 refills | Status: DC

## 2018-07-07 NOTE — Patient Instructions (Signed)
Great to meet you!  Use a mild soap like dove or cetaphil for washing - these are gentle on skin

## 2018-07-07 NOTE — Progress Notes (Signed)
Subjective:    Patient ID: Pamela Calhoun, female    DOB: [birth year], 32 y.o.   MRN: [id number]  HPI   Presents to clinic due to sore throat, headache, fever for 2 days. Also c/o vaginal odor and discharge for 1 week.   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  She denies any possibility of STD.  States about 2 weeks ago she used a tampon while swimming at summer camp, she usually does not use tampons because in the past they have been extremely irritating to her vaginal skin.   Patient Active Problem List   Diagnosis Date Noted  . Cough 04/05/2018  . Routine general medical examination at a health care facility 03/24/2018  . Hydradenitis 10/26/2017  . Screen for STD (sexually transmitted disease) 01/23/2015  . MRSA (methicillin resistant Staphylococcus aureus) carrier 04/12/2014  . INTERSTITIAL CYSTITIS 06/25/2010  . Allergic rhinitis 03/22/2007  . SKIN CANCER, HX OF 03/22/2007   Social History   Tobacco Use  . Smoking status: Former Smoker    Last attempt to quit: 02/04/2012    Years since quitting: 6.4  . Smokeless tobacco: Former Network engineer Use Topics  . Alcohol use: Yes    Alcohol/week: 1.0 standard drinks    Types: 1 Standard drinks or equivalent per week    Comment: occassionally   Review of Systems  Constitutional:+fever and fatigue  HENT: +sore throat   Eyes: Negative.   Respiratory: Negative for cough, shortness of breath and wheezing.   Cardiovascular: Negative for chest pain, palpitations and leg swelling.  Gastrointestinal: Negative for abdominal pain, diarrhea, nausea and vomiting.  Genitourinary: +vaginal odor and discharge  Musculoskeletal: Negative for arthralgias and myalgias.  Skin: Negative for color change, pallor and rash.  Neurological: Negative for syncope, light-headedness and headaches.  Psychiatric/Behavioral: The patient is not nervous/anxious.       Objective:     Physical Exam  Constitutional: She is oriented to person, place, and time. She appears well-developed and well-nourished.  Non-toxic appearance. No distress.  HENT:  Right Ear: Tympanic membrane and ear canal normal.  Left Ear: Tympanic membrane and ear canal normal.  Mouth/Throat: Uvula is midline. Mucous membranes are not pale, not dry and not cyanotic. Posterior oropharyngeal erythema present. No oropharyngeal exudate or posterior oropharyngeal edema.  Mild redness in throat. No exudates.   Eyes: Pupils are equal, round, and reactive to light. EOM are normal.  Cardiovascular: Normal rate, regular rhythm and normal heart sounds.  Pulmonary/Chest: Effort normal and breath sounds normal. No respiratory distress.  Abdominal: Soft. Bowel sounds are normal. She exhibits no distension. There is no tenderness. There is no guarding.  Genitourinary: Pelvic exam was performed with patient supine. Cervix exhibits no discharge. Right adnexum displays no tenderness. Left adnexum displays no tenderness. No bleeding in the vagina. No foreign body in the vagina. Vaginal discharge found.  Genitourinary Comments: Small to moderate amount of white discharge in vagina.   Neurological: She is alert and oriented to person, place, and time.  Psychiatric: She has a normal mood and affect.  Nursing note and vitals reviewed.     Assessment & Plan:    Viral pharyngitis - Rest, increase fluids, tylenol or motrin as needed for pain, salt water gargles, chloraseptic throat spray  Vaginal discharge - Suspect BV or yeast. Will treat for both with flagyl course and diflucan. STD testing collected and sent to lab. Discussed  use of mild soap when washing genitourinary area, rinsing well with water. Avoid any douching or scented soaps.   Follow up if symptoms persist or worsen.

## 2018-07-12 LAB — CERVICOVAGINAL ANCILLARY ONLY
BACTERIAL VAGINITIS: POSITIVE — AB
CANDIDA VAGINITIS: NEGATIVE
Chlamydia: NEGATIVE
HPV: NOT DETECTED
Neisseria Gonorrhea: NEGATIVE
Trichomonas: NEGATIVE

## 2018-07-28 ENCOUNTER — Encounter: Payer: Self-pay | Admitting: Family Medicine

## 2018-07-28 ENCOUNTER — Ambulatory Visit: Payer: BLUE CROSS/BLUE SHIELD | Admitting: Family Medicine

## 2018-07-28 DIAGNOSIS — N760 Acute vaginitis: Secondary | ICD-10-CM | POA: Diagnosis not present

## 2018-07-28 DIAGNOSIS — B9689 Other specified bacterial agents as the cause of diseases classified elsewhere: Secondary | ICD-10-CM | POA: Diagnosis not present

## 2018-07-28 LAB — POCT WET PREP (WET MOUNT): Trichomonas Wet Prep HPF POC: ABSENT

## 2018-07-28 MED ORDER — METRONIDAZOLE 0.75 % VA GEL: 1.0000 | Freq: Two times a day (BID) | VAGINAL | 0 refills | Status: AC

## 2018-07-28 NOTE — Assessment & Plan Note (Signed)
Recurrent with clue cells on wet prep =improved but not resolved with oral flagyl Per pt she resp better to topical metrogel vaginal px for use bid for a week Update if not starting to improve in a week or if worsening

## 2018-07-28 NOTE — Progress Notes (Signed)
Subjective:    Patient ID: Pamela Calhoun, female    DOB: [birth year], 32 y.o.   MRN: [id number]  HPI Here for vaginal d/c for 1 mo   Saw gyn 3 weeks ago and given diflucan for yeast  Symptoms did not totally clear  Some pain/pressure in pelvic area  Wt Readings from Last 3 Encounters:  07/28/18 149 lb 8 oz (67.8 kg)  07/07/18 150 lb 2 oz (68.1 kg)  04/05/18 144 lb (65.3 kg)   22.40 kg/m   Per epic= had neg screen for gc/chlamydia on 8/15  Pos wet prep for BV (gardenarella), neg trich and neg candida  tx with diflucan and flagyl by FNP Guse   Symptoms improved - pain did / but d/c did not and odor also  Pain got worse last week  Does not think she has uti (IC) -bladder is spasming (this often happens with vaginal inf and she is certain she does not have a uti  Wet prep today Results for orders placed or performed in visit on 07/28/18  POCT Wet Prep [employer] [facility])  Result Value Ref Range   Source Wet Prep POC vag    WBC, Wet Prep HPF POC many    Bacteria Wet Prep HPF POC Many (A) Few   BACTERIA WET PREP MORPHOLOGY POC     Clue Cells Wet Prep HPF POC Many (A) None   Clue Cells Wet Prep Whiff POC     Yeast Wet Prep HPF POC None None   KOH Wet Prep POC None None   Trichomonas Wet Prep HPF POC Absent Absent     Patient Active Problem List   Diagnosis Date Noted  . BV (bacterial vaginosis) 07/28/2018  . Cough 04/05/2018  . Routine general medical examination at a health care facility 03/24/2018  . Hydradenitis 10/26/2017  . Screen for STD (sexually transmitted disease) 01/23/2015  . MRSA (methicillin resistant Staphylococcus aureus) carrier 04/12/2014  . INTERSTITIAL CYSTITIS 06/25/2010  . Allergic rhinitis 03/22/2007  . SKIN CANCER, HX OF 03/22/2007   Past Medical History:  Diagnosis Date  . Allergy   . Cancer ([place])    skin- hystory of melanoma   . Depression   . Endometriosis   . Fracture    right foot  . Frequent UTI   . History of shingles   .  Interstitial cystitis   . Pap smear, abnormal    cryosx   . PID (acute pelvic inflammatory disease)    hx of  . Ureteral reflux    as a child    Past Surgical History:  Procedure Laterality Date  . DILATION AND CURETTAGE OF UTERUS    . EXCISION OF SKIN TAG N/A 02/11/2016   Procedure: EXCISION OF ANAL SKIN TAG AN INTERNAL PAPILLA ;  Surgeon: Leighton Ruff, MD;  Location: [facility];  Service: General;  Laterality: N/A;  . LAPAROSCOPY     pelvic   . MELANOMA EXCISION     Right leg   . TONSILLECTOMY     Social History   Tobacco Use  . Smoking status: Former Smoker    Last attempt to quit: 02/04/2012    Years since quitting: 6.4  . Smokeless tobacco: Former Network engineer Use Topics  . Alcohol use: Yes    Alcohol/week: 1.0 standard drinks    Types: 1 Standard drinks or equivalent per week    Comment: occassionally  . Drug use: No   Family History  Problem Relation Age  of Onset  . Cancer Maternal Grandmother        breast   Allergies  Allergen Reactions  . Cetirizine Hcl     REACTION: diarrhea  . Vicodin [Hydrocodone-Acetaminophen] Itching   Current Outpatient Medications on File Prior to Visit  Medication Sig Dispense Refill  . ALPRAZolam (XANAX) 1 MG tablet   0  . ELMIRON 100 MG capsule Take 2 tablets by mouth at bedtime.   11  . hydrOXYzine (ATARAX/VISTARIL) 25 MG tablet Take 1 tablet (25 mg total) every 6 (six) hours by mouth. 12 tablet 0  . norethindrone-ethinyl estradiol (MICROGESTIN,JUNEL,LOESTRIN) 1-20 MG-MCG tablet   11   No current facility-administered medications on file prior to visit.      Review of Systems  Constitutional: Negative for activity change, appetite change, fatigue, fever and unexpected weight change.  HENT: Negative for congestion, ear pain, rhinorrhea, sinus pressure and sore throat.   Eyes: Negative for pain, redness and visual disturbance.  Respiratory: Negative for cough, shortness of breath and wheezing.     Cardiovascular: Negative for chest pain and palpitations.  Gastrointestinal: Negative for abdominal pain, blood in stool, constipation and diarrhea.  Endocrine: Negative for polydipsia and polyuria.  Genitourinary: Positive for dysuria, frequency, pelvic pain and vaginal discharge. Negative for difficulty urinating, flank pain, genital sores, hematuria and urgency.  Musculoskeletal: Negative for arthralgias, back pain and myalgias.  Skin: Negative for pallor and rash.  Allergic/Immunologic: Negative for environmental allergies.  Neurological: Negative for dizziness, syncope and headaches.  Hematological: Negative for adenopathy. Does not bruise/bleed easily.  Psychiatric/Behavioral: Negative for decreased concentration and dysphoric mood. The patient is not nervous/anxious.        Objective:   Physical Exam  Constitutional: She appears well-developed and well-nourished. No distress.  Well appearing   HENT:  Head: Normocephalic and atraumatic.  Eyes: Pupils are equal, round, and reactive to light. Conjunctivae and EOM are normal. Left eye exhibits no discharge. No scleral icterus.  Neck: Normal range of motion. Neck supple.  Cardiovascular: Normal rate, regular rhythm and normal heart sounds.  Pulmonary/Chest: Effort normal and breath sounds normal.  Abdominal: Soft. Bowel sounds are normal. She exhibits no mass. There is no rebound and no guarding.  No suprapubic tenderness or fullness  No cva tenderness   Genitourinary:  Genitourinary Comments: Nl vaginal mucosa with thin white d/c  No lesions  No tenderness  Musculoskeletal: She exhibits no edema.  Lymphadenopathy:    She has no cervical adenopathy.  Neurological: She is alert.  Skin: Skin is warm and dry. No erythema.  Psychiatric: She has a normal mood and affect.          Assessment & Plan:   Problem List Items Addressed This Visit      Genitourinary   BV (bacterial vaginosis)    Recurrent with clue cells on  wet prep =improved but not resolved with oral flagyl Per pt she resp better to topical metrogel vaginal px for use bid for a week Update if not starting to improve in a week or if worsening         Relevant Orders   POCT Wet Prep [facility] [place]) (Completed)

## 2018-07-28 NOTE — Patient Instructions (Signed)
Use the vaginal metrogel  Alert me if no improvement or if worse

## 2018-08-16 ENCOUNTER — Encounter: Payer: Self-pay | Admitting: Internal Medicine

## 2018-08-16 ENCOUNTER — Ambulatory Visit: Payer: BLUE CROSS/BLUE SHIELD | Admitting: Internal Medicine

## 2018-08-16 VITALS — BP 120/80 | HR 81 | Temp 98.4°F | Wt 152.0 lb

## 2018-08-16 DIAGNOSIS — J0100 Acute maxillary sinusitis, unspecified: Secondary | ICD-10-CM | POA: Diagnosis not present

## 2018-08-16 MED ORDER — METHYLPREDNISOLONE ACETATE 80 MG/ML IJ SUSP
80.0000 mg | Freq: Once | INTRAMUSCULAR | Status: AC
  Administered 2018-08-16: 80 mg via INTRAMUSCULAR

## 2018-08-16 MED ORDER — AMOXICILLIN-POT CLAVULANATE 875-125 MG PO TABS: 1.0000 | ORAL_TABLET | Freq: Two times a day (BID) | ORAL | 0 refills | Status: DC

## 2018-08-16 NOTE — Patient Instructions (Signed)

## 2018-08-16 NOTE — Progress Notes (Signed)
HPI  Pt presents to the clinic today with c/o headache, facial pain and pressure, nasal congestion and sore throat. She reports this started 2 weeks ago. She is blowing yellow/green mucous out of her nose. She denies difficulty swallowing. She denies runny nose, ear pain or cough. She denies fever, chills or body aches. She has tried Copywriter, advertising, Mucinex, Ibuprofen, and Claritin without any relief. She has a history of allergies. She has had sick contacts.  Review of Systems     Past Medical History:  Diagnosis Date  . Allergy   . Cancer ([place])    skin- hystory of melanoma   . Depression   . Endometriosis   . Fracture    right foot  . Frequent UTI   . History of shingles   . Interstitial cystitis   . Pap smear, abnormal    cryosx   . PID (acute pelvic inflammatory disease)    hx of  . Ureteral reflux    as a child     Family History  Problem Relation Age of Onset  . Cancer Maternal Grandmother        breast    Social History   Socioeconomic History  . Marital status: Single    Spouse name: Not on file  . Number of children: Not on file  . Years of education: Not on file  . Highest education level: Not on file  Occupational History  . Not on file  Social Needs  . Financial resource strain: Not on file  . Food insecurity:    Worry: Not on file    Inability: Not on file  . Transportation needs:    Medical: Not on file    Non-medical: Not on file  Tobacco Use  . Smoking status: Former Smoker    Last attempt to quit: 02/04/2012    Years since quitting: 6.5  . Smokeless tobacco: Former Network engineer and Sexual Activity  . Alcohol use: Yes    Alcohol/week: 1.0 standard drinks    Types: 1 Standard drinks or equivalent per week    Comment: occassionally  . Drug use: No  . Sexual activity: Not on file  Lifestyle  . Physical activity:    Days per week: Not on file    Minutes per session: Not on file  . Stress: Not on file  Relationships  . Social  connections:    Talks on phone: Not on file    Gets together: Not on file    Attends religious service: Not on file    Active member of club or organization: Not on file    Attends meetings of clubs or organizations: Not on file    Relationship status: Not on file  . Intimate partner violence:    Fear of current or ex partner: Not on file    Emotionally abused: Not on file    Physically abused: Not on file    Forced sexual activity: Not on file  Other Topics Concern  . Not on file  Social History Narrative  . Not on file    Allergies  Allergen Reactions  . Cetirizine Hcl     REACTION: diarrhea  . Vicodin [Hydrocodone-Acetaminophen] Itching     Constitutional: Positive headache. Denies fatigue, fever or abrupt weight changes.  HEENT:  Positive facial pain, nasal congestion and sore throat. Denies eye redness, ear pain, ringing in the ears, wax buildup, runny nose or bloody nose. Respiratory:  Denies cough, difficulty breathing or shortness of  breath.  Cardiovascular: Denies chest pain, chest tightness, palpitations or swelling in the hands or feet.   No other specific complaints in a complete review of systems (except as listed in HPI above).  Objective:   BP 120/80   Pulse 81   Temp 98.4 F (36.9 C) (Oral)   Wt 152 lb (68.9 kg)   SpO2 98%   BMI 22.78 kg/m   General: Appears his stated age, in NAD. HEENT: Head: normal shape and size, maxillary sinus tenderness noted;  Ears: Tm's gray and intact, normal light reflex; Nose: mucosa boggy and moist, septum midline; Throat/Mouth: + PND. Teeth present, mucosa erythematous and moist, no exudate noted, no lesions or ulcerations noted.  Neck:  Bilateral anterior cervical adenopathy noted.  Cardiovascular: Normal rate and rhythm. S1,S2 noted.  No murmur, rubs or gallops noted.  Pulmonary/Chest: Normal effort and positive vesicular breath sounds. No respiratory distress. No wheezes, rales or ronchi noted.       Assessment &  Plan:   Acute Maxillary Sinusitis  Can use a Neti Pot which can be purchased from your local drug store. Flonase 2 sprays each nostril for 3 days and then as needed. 80 mg Depo IM today eRx for Augmentin BID for 10 days Work note provided  RTC as needed or if symptoms persist. Webb Silversmith, NP

## 2018-08-16 NOTE — Addendum Note (Signed)
Addended by: Lurlean Nanny on: 08/16/2018 03:33 PM   Modules accepted: Orders

## 2018-11-04 ENCOUNTER — Ambulatory Visit: Payer: BLUE CROSS/BLUE SHIELD | Admitting: Family Medicine

## 2018-11-04 ENCOUNTER — Encounter: Payer: Self-pay | Admitting: Family Medicine

## 2018-11-04 VITALS — BP 102/68 | HR 90 | Temp 97.9°F | Ht 68.5 in | Wt 157.0 lb

## 2018-11-04 DIAGNOSIS — R52 Pain, unspecified: Secondary | ICD-10-CM

## 2018-11-04 DIAGNOSIS — J22 Unspecified acute lower respiratory infection: Secondary | ICD-10-CM | POA: Diagnosis not present

## 2018-11-04 LAB — POC INFLUENZA A&B (BINAX/QUICKVUE)
INFLUENZA B, POC: NEGATIVE
Influenza A, POC: NEGATIVE

## 2018-11-04 MED ORDER — AZITHROMYCIN 250 MG PO TABS: ORAL_TABLET | ORAL | 0 refills | Status: DC

## 2018-11-04 MED ORDER — BENZONATATE 100 MG PO CAPS: 100.0000 mg | ORAL_CAPSULE | Freq: Three times a day (TID) | ORAL | 0 refills | Status: DC | PRN

## 2018-11-04 MED ORDER — ALBUTEROL SULFATE HFA 108 (90 BASE) MCG/ACT IN AERS: 2.0000 | INHALATION_SPRAY | RESPIRATORY_TRACT | 1 refills | Status: DC | PRN

## 2018-11-04 NOTE — Progress Notes (Signed)
Subjective:    Patient ID: Pamela Calhoun, female    DOB: 1986/01/05, 32 y.o.   MRN: [id number]  HPI This is a 32 yo female who presents today with 2 days of body aches, headache. Fever 102 two days ago. Cough started after. Teaches preschool. Has taken Alkaseltzer, ibuprofen, tylenol. Some wheeze, some SOB. Drinking liquids ok. Has been exposed to flu, bronchitis, stomach bug at preschool where she works. Cough rarely productive, occasionally thick.   Past Medical History:  Diagnosis Date  . Allergy   . Cancer ([place])    skin- hystory of melanoma   . Depression   . Endometriosis   . Fracture    right foot  . Frequent UTI   . History of shingles   . Interstitial cystitis   . Pap smear, abnormal    cryosx   . PID (acute pelvic inflammatory disease)    hx of  . Ureteral reflux    as a child    Past Surgical History:  Procedure Laterality Date  . DILATION AND CURETTAGE OF UTERUS    . EXCISION OF SKIN TAG N/A 02/11/2016   Procedure: EXCISION OF ANAL SKIN TAG AN INTERNAL PAPILLA ;  Surgeon: Leighton Ruff, MD;  Location: [facility];  Service: General;  Laterality: N/A;  . LAPAROSCOPY     pelvic   . MELANOMA EXCISION     Right leg   . TONSILLECTOMY     Family History  Problem Relation Age of Onset  . Cancer Maternal Grandmother        breast   Social History   Tobacco Use  . Smoking status: Former Smoker    Last attempt to quit: 02/04/2012    Years since quitting: 6.7  . Smokeless tobacco: Former Network engineer Use Topics  . Alcohol use: Yes    Alcohol/week: 1.0 standard drinks    Types: 1 Standard drinks or equivalent per week    Comment: occassionally  . Drug use: No      Review of Systems Per HPI    Objective:   Physical Exam Vitals signs reviewed.  Constitutional:      Appearance: She is normal weight. She is ill-appearing.  HENT:     Head: Normocephalic and atraumatic.     Right Ear: Tympanic membrane, ear canal and external ear  normal.     Left Ear: Tympanic membrane, ear canal and external ear normal.     Nose: Nose normal.     Mouth/Throat:     Mouth: Mucous membranes are moist.     Pharynx: Oropharynx is clear.  Eyes:     Conjunctiva/sclera: Conjunctivae normal.  Neck:     Musculoskeletal: Normal range of motion and neck supple.  Cardiovascular:     Rate and Rhythm: Normal rate and regular rhythm.     Heart sounds: Normal heart sounds.  Pulmonary:     Effort: Pulmonary effort is normal.     Breath sounds: Rhonchi (left posterior lobe, some clearing with cough) present.  Neurological:     Mental Status: She is alert and oriented to person, place, and time.  Psychiatric:        Mood and Affect: Mood normal.        Behavior: Behavior normal.        Thought Content: Thought content normal.        Judgment: Judgment normal.       BP 102/68 (BP Location: Right Arm, Patient Position: Sitting, Cuff  Size: Normal)   Pulse 90   Temp 97.9 F (36.6 C) (Oral)   Ht 5' 8.5" (1.74 m)   Wt 157 lb (71.2 kg)   SpO2 97%   BMI 23.52 kg/m  Wt Readings from Last 3 Encounters:  11/04/18 157 lb (71.2 kg)  08/16/18 152 lb (68.9 kg)  07/28/18 149 lb 8 oz (67.8 kg)   Results for orders placed or performed in visit on 11/04/18  POC Influenza A&B(BINAX/QUICKVUE)  Result Value Ref Range   Influenza A, POC Negative Negative   Influenza B, POC Negative Negative       Assessment & Plan:  1. Generalized body aches - POC Influenza A&B(BINAX/QUICKVUE)  2. Lower respiratory infection - Provided written and verbal information regarding diagnosis and treatment. - RTC precautions reviewed - azithromycin (ZITHROMAX) 250 MG tablet; Take 2 tabs PO x 1 dose, then 1 tab PO QD x 4 days  Dispense: 6 tablet; Refill: 0 - benzonatate (TESSALON) 100 MG capsule; Take 1-2 capsules (100-200 mg total) by mouth 3 (three) times daily as needed.  Dispense: 30 capsule; Refill: 0 - albuterol (PROVENTIL HFA;VENTOLIN HFA) 108 (90 Base)  MCG/ACT inhaler; Inhale 2 puffs into the lungs every 4 (four) hours as needed for wheezing or shortness of breath (cough, shortness of breath or wheezing.).  Dispense: 1 Inhaler; Refill: [place], FNP-BC  [facility]  11/04/2018 4:56 PM

## 2018-11-04 NOTE — Patient Instructions (Signed)
Good to see you today, I hope you feel better soon  I have sent in a cough pill, antibiotic, and albuterol inhaler to your pharmacy  You can also take the following for your symptoms-   For nasal congestion you can use Afrin nasal spray for 3 days max, Sudafed, saline nasal spray (generic is fine for all). For thick phlegm, you can take Mucinex to thin your secretions (gereric is fine) Drink enough fluids to make your urine light yellow. For fever/chill/muscle aches you can take over the counter acetaminophen or ibuprofen.  Please come back in if you are not better in 5-7 days or if you develop wheezing, shortness of breath or persistent vomiting.

## 2019-01-31 ENCOUNTER — Ambulatory Visit
Admission: EM | Admit: 2019-01-31 | Discharge: 2019-01-31 | Disposition: A | Discharge status: Home / Self Care | Payer: BLUE CROSS/BLUE SHIELD | Attending: Family Medicine | Admitting: Family Medicine

## 2019-01-31 ENCOUNTER — Other Ambulatory Visit: Payer: Self-pay

## 2019-01-31 ENCOUNTER — Encounter: Payer: Self-pay | Admitting: Emergency Medicine

## 2019-01-31 DIAGNOSIS — K529 Noninfective gastroenteritis and colitis, unspecified: Secondary | ICD-10-CM

## 2019-01-31 DIAGNOSIS — Z87891 Personal history of nicotine dependence: Secondary | ICD-10-CM | POA: Diagnosis not present

## 2019-01-31 DIAGNOSIS — E860 Dehydration: Secondary | ICD-10-CM

## 2019-01-31 MED ORDER — ONDANSETRON 4 MG PO TBDP: 4.0000 mg | ORAL_TABLET | Freq: Three times a day (TID) | ORAL | 0 refills | Status: DC | PRN

## 2019-01-31 MED ORDER — ONDANSETRON 8 MG PO TBDP
8.0000 mg | ORAL_TABLET | Freq: Once | ORAL | Status: AC
  Administered 2019-01-31: 8 mg via ORAL

## 2019-01-31 NOTE — Discharge Instructions (Signed)
Frequent sips.  Medication as needed.  Take care  Dr. Lacinda Axon

## 2019-01-31 NOTE — ED Provider Notes (Signed)
[facility]    CSN: [id number] Arrival date & time: 01/31/19  1709  History   Chief Complaint Chief Complaint  Patient presents with  . Emesis  . Diarrhea   HPI  33 year old female presents with the above complaints.  Patient reports her symptoms started yesterday.  She reports nausea, vomiting, diarrhea.  Associated diffuse abdominal pain.  Severe. She has had a low-grade temperature.  Associated chills and body aches.  No medications tried. No known exacerbating or relieving factors.  Vomiting has now subsided. Tolerating fluids.  No other associated symptoms.  No other complaints.  Past Medical History:  Diagnosis Date  . Allergy   . Cancer ([place])    skin- hystory of melanoma   . Depression   . Endometriosis   . Fracture    right foot  . Frequent UTI   . History of shingles   . Interstitial cystitis   . Pap smear, abnormal    cryosx   . PID (acute pelvic inflammatory disease)    hx of  . Ureteral reflux    as a child     Patient Active Problem List   Diagnosis Date Noted  . BV (bacterial vaginosis) 07/28/2018  . Cough 04/05/2018  . Routine general medical examination at a health care facility 03/24/2018  . Hydradenitis 10/26/2017  . Screen for STD (sexually transmitted disease) 01/23/2015  . MRSA (methicillin resistant Staphylococcus aureus) carrier 04/12/2014  . INTERSTITIAL CYSTITIS 06/25/2010  . Allergic rhinitis 03/22/2007  . SKIN CANCER, HX OF 03/22/2007    Past Surgical History:  Procedure Laterality Date  . DILATION AND CURETTAGE OF UTERUS    . EXCISION OF SKIN TAG N/A 02/11/2016   Procedure: EXCISION OF ANAL SKIN TAG AN INTERNAL PAPILLA ;  Surgeon: Leighton Ruff, MD;  Location: [facility];  Service: General;  Laterality: N/A;  . LAPAROSCOPY     pelvic   . MELANOMA EXCISION     Right leg   . TONSILLECTOMY      OB History    Gravida  2   Para  0   Term      Preterm      AB  2   Living        SAB  1   TAB  1   Ectopic      Multiple      Live Births               Home Medications    Prior to Admission medications   Medication Sig Start Date End Date Taking? Authorizing Provider  albuterol (PROVENTIL HFA;VENTOLIN HFA) 108 (90 Base) MCG/ACT inhaler Inhale 2 puffs into the lungs every 4 (four) hours as needed for wheezing or shortness of breath (cough, shortness of breath or wheezing.). 11/04/18  Yes Elby Beck, FNP  ALPRAZolam Duanne Moron) 1 MG tablet  10/07/17  Yes [provider]  ELMIRON 100 MG capsule Take 2 tablets by mouth at bedtime.  02/18/17  Yes [provider]  hydrOXYzine (ATARAX/VISTARIL) 25 MG tablet Take 1 tablet (25 mg total) every 6 (six) hours by mouth. 10/05/17  Yes Avie Echevaria B, PA-C  norethindrone-ethinyl estradiol (MICROGESTIN,JUNEL,LOESTRIN) 1-20 MG-MCG tablet  06/01/18  Yes [provider]  ondansetron (ZOFRAN-ODT) 4 MG disintegrating tablet Take 1 tablet (4 mg total) by mouth every 8 (eight) hours as needed for nausea or vomiting. 01/31/19   Coral Spikes, DO    Family History Family History  Problem  Relation Age of Onset  . Cancer Maternal Grandmother        breast    Social History Social History   Tobacco Use  . Smoking status: Former Smoker    Last attempt to quit: 02/04/2012    Years since quitting: 6.9  . Smokeless tobacco: Former Network engineer Use Topics  . Alcohol use: Yes    Alcohol/week: 1.0 standard drinks    Types: 1 Standard drinks or equivalent per week    Comment: occassionally  . Drug use: No     Allergies   Cetirizine hcl and Vicodin [hydrocodone-acetaminophen]   Review of Systems Review of Systems  Constitutional: Positive for chills and fever.  Gastrointestinal: Positive for abdominal pain, diarrhea, nausea and vomiting.   Physical Exam Triage Vital Signs ED Triage Vitals  Enc Vitals Group     BP 01/31/19 1737 133/85     Pulse Rate 01/31/19 1737 71     Resp 01/31/19 1737 14       Temp 01/31/19 1737 98 F (36.7 C)     Temp Source 01/31/19 1737 Oral     SpO2 01/31/19 1737 100 %     Weight 01/31/19 1734 155 lb (70.3 kg)     Height 01/31/19 1734 5\' 10"  (1.778 m)     Head Circumference --      Peak Flow --      Pain Score 01/31/19 1734 6     Pain Loc --      Pain Edu? --      Excl. in [place]? --    Updated Vital Signs BP 133/85 (BP Location: Left Arm)   Pulse 71   Temp 98 F (36.7 C) (Oral)   Resp 14   Ht 5\' 10"  (1.778 m)   Wt 70.3 kg   SpO2 100%   BMI 22.24 kg/m   Visual Acuity Right Eye Distance:   Left Eye Distance:   Bilateral Distance:    Right Eye Near:   Left Eye Near:    Bilateral Near:     Physical Exam Constitutional:      General: She is not in acute distress.    Appearance: She is normal weight.  HENT:     Head: Normocephalic and atraumatic.     Nose: Nose normal.     Mouth/Throat:     Pharynx: Oropharynx is clear. No oropharyngeal exudate.  Eyes:     General:        Right eye: No discharge.        Left eye: No discharge.     Conjunctiva/sclera: Conjunctivae normal.  Cardiovascular:     Rate and Rhythm: Normal rate and regular rhythm.  Pulmonary:     Effort: Pulmonary effort is normal.     Breath sounds: Normal breath sounds.  Abdominal:     General: There is no distension.     Palpations: Abdomen is soft.     Comments: Diffusely tender.  Neurological:     Mental Status: She is alert.  Psychiatric:        Mood and Affect: Mood normal.        Behavior: Behavior normal.    UC Treatments / Results  Labs (all labs ordered are listed, but only abnormal results are displayed) Labs Reviewed - No data to display  EKG None  Radiology No results found.  Procedures Procedures (including critical care time)  Medications Ordered in UC Medications  ondansetron (ZOFRAN-ODT) disintegrating tablet 8 mg (8 mg Oral  Given 01/31/19 1741)    Initial Impression / Assessment and Plan / UC Course  I have reviewed the triage  vital signs and the nursing notes.  Pertinent labs & imaging results that were available during my care of the patient were reviewed by me and considered in my medical decision making (see chart for details).    33 year old female presents with gastroenteritis with associated dehydration.  Tolerating fluids.  Zofran as needed.  Work note given.  Final Clinical Impressions(s) / UC Diagnoses   Final diagnoses:  Gastroenteritis  Dehydration     Discharge Instructions     Frequent sips.  Medication as needed.  Take care  Dr. Lacinda Axon    ED Prescriptions    Medication Sig Dispense Auth. Provider   ondansetron (ZOFRAN-ODT) 4 MG disintegrating tablet Take 1 tablet (4 mg total) by mouth every 8 (eight) hours as needed for nausea or vomiting. 20 tablet Coral Spikes, DO     Controlled Substance Prescriptions [place] Controlled Substance Registry consulted? Not Applicable   Coral Spikes, DO 01/31/19 2106

## 2019-01-31 NOTE — ED Triage Notes (Signed)
Patient c/o N/V/D that started yesterday.  Patient reports fever and chills and bodyaches.

## 2019-04-19 ENCOUNTER — Ambulatory Visit
Admission: EM | Admit: 2019-04-19 | Discharge: 2019-04-19 | Disposition: A | Discharge status: Home / Self Care | Payer: BLUE CROSS/BLUE SHIELD | Attending: Emergency Medicine | Admitting: Emergency Medicine

## 2019-04-19 ENCOUNTER — Telehealth: Payer: Self-pay

## 2019-04-19 ENCOUNTER — Other Ambulatory Visit: Payer: Self-pay

## 2019-04-19 ENCOUNTER — Ambulatory Visit (INDEPENDENT_AMBULATORY_CARE_PROVIDER_SITE_OTHER): Payer: BLUE CROSS/BLUE SHIELD

## 2019-04-19 DIAGNOSIS — Z7189 Other specified counseling: Secondary | ICD-10-CM

## 2019-04-19 DIAGNOSIS — R509 Fever, unspecified: Secondary | ICD-10-CM

## 2019-04-19 DIAGNOSIS — R059 Cough, unspecified: Secondary | ICD-10-CM

## 2019-04-19 DIAGNOSIS — R05 Cough: Secondary | ICD-10-CM

## 2019-04-19 DIAGNOSIS — B349 Viral infection, unspecified: Secondary | ICD-10-CM

## 2019-04-19 DIAGNOSIS — Z20822 Contact with and (suspected) exposure to covid-19: Secondary | ICD-10-CM

## 2019-04-19 MED ORDER — GUAIFENESIN-CODEINE 100-10 MG/5ML PO SOLN: 5.0000 mL | Freq: Every evening | ORAL | 0 refills | Status: DC | PRN

## 2019-04-19 MED ORDER — BENZONATATE 100 MG PO CAPS: 100.0000 mg | ORAL_CAPSULE | Freq: Three times a day (TID) | ORAL | 0 refills | Status: DC | PRN

## 2019-04-19 MED ORDER — ALBUTEROL SULFATE HFA 108 (90 BASE) MCG/ACT IN AERS: 2.0000 | INHALATION_SPRAY | RESPIRATORY_TRACT | 0 refills | Status: DC | PRN

## 2019-04-19 NOTE — ED Provider Notes (Addendum)
[facility] ____________________________________________  Time seen: Approximately 6:10 PM  I have reviewed the triage vital signs and the nursing notes.   HISTORY  Chief Complaint Fever   HPI Pamela Calhoun is a 33 y.o. female presenting for evaluation of approximately 2 weeks of cough and nasal congestion symptoms.  Patient reports cough has continued to increase.  Patient does have a history of seasonal allergies and occasional wheezing when sick.  States that she has occasionally noticed herself wheezing at night with her cough.  Denies other wheezing.  States she does intermittently feel short of breath and winded, particularly with cough and wheeze.  States she has soreness in her chest with coughing.  Denies hemoptysis.  Cough is occasionally productive but mostly dry.  Occasional nasal congestion.  Occasional sore throat from coughing.  Declined strep swab.  States this past Friday to Monday she was running a fever with T-max 101.  Has been taken intermittent over-the-counter Tylenol and ibuprofen as well as cough medication.  Denies other aggravating leaving factors.  Works in a daycare.  Denies home sick contacts.  Denies known coated contacts.  Denies pregnancy.  No LMP recorded. (Menstrual status: Oral contraceptives).  Tower, Wynelle Fanny, MD: PCP    Past Medical History:  Diagnosis Date  . Allergy   . Cancer ([place])    skin- hystory of melanoma   . Depression   . Endometriosis   . Fracture    right foot  . Frequent UTI   . History of shingles   . Interstitial cystitis   . Pap smear, abnormal    cryosx   . PID (acute pelvic inflammatory disease)    hx of  . Ureteral reflux    as a child     Patient Active Problem List   Diagnosis Date Noted  . BV (bacterial vaginosis) 07/28/2018  . Cough 04/05/2018  . Routine general medical examination at a health care facility 03/24/2018  . Hydradenitis 10/26/2017  . Screen for STD (sexually transmitted disease)  01/23/2015  . MRSA (methicillin resistant Staphylococcus aureus) carrier 04/12/2014  . INTERSTITIAL CYSTITIS 06/25/2010  . Allergic rhinitis 03/22/2007  . SKIN CANCER, HX OF 03/22/2007    Past Surgical History:  Procedure Laterality Date  . DILATION AND CURETTAGE OF UTERUS    . EXCISION OF SKIN TAG N/A 02/11/2016   Procedure: EXCISION OF ANAL SKIN TAG AN INTERNAL PAPILLA ;  Surgeon: Leighton Ruff, MD;  Location: [facility];  Service: General;  Laterality: N/A;  . LAPAROSCOPY     pelvic   . MELANOMA EXCISION     Right leg   . TONSILLECTOMY       No current facility-administered medications for this encounter.   Current Outpatient Medications:  .  albuterol (PROVENTIL HFA;VENTOLIN HFA) 108 (90 Base) MCG/ACT inhaler, Inhale 2 puffs into the lungs every 4 (four) hours as needed for wheezing or shortness of breath (cough, shortness of breath or wheezing.)., Disp: 1 Inhaler, Rfl: 1 .  ALPRAZolam (XANAX) 1 MG tablet, , Disp: , Rfl: 0 .  ELMIRON 100 MG capsule, Take 2 tablets by mouth at bedtime. , Disp: , Rfl: 11 .  hydrOXYzine (ATARAX/VISTARIL) 25 MG tablet, Take 1 tablet (25 mg total) every 6 (six) hours by mouth., Disp: 12 tablet, Rfl: 0 .  norethindrone-ethinyl estradiol (MICROGESTIN,JUNEL,LOESTRIN) 1-20 MG-MCG tablet, , Disp: , Rfl: 11 .  albuterol (VENTOLIN HFA) 108 (90 Base) MCG/ACT inhaler, Inhale 2 puffs into the lungs every 4 (four) hours  as needed for wheezing., Disp: 1 Inhaler, Rfl: 0 .  benzonatate (TESSALON PERLES) 100 MG capsule, Take 1 capsule (100 mg total) by mouth 3 (three) times daily as needed for cough., Disp: 15 capsule, Rfl: 0 .  guaiFENesin-codeine 100-10 MG/5ML syrup, Take 5 mLs by mouth at bedtime as needed for cough. Do not drive or operate machinery as can cause drowsiness., Disp: 50 mL, Rfl: 0 .  ondansetron (ZOFRAN-ODT) 4 MG disintegrating tablet, Take 1 tablet (4 mg total) by mouth every 8 (eight) hours as needed for nausea or vomiting., Disp: 20  tablet, Rfl: 0  Allergies Cetirizine hcl and Vicodin [hydrocodone-acetaminophen]  Family History  Problem Relation Age of Onset  . Cancer Maternal Grandmother        breast    Social History Social History   Tobacco Use  . Smoking status: Former Smoker    Last attempt to quit: 02/04/2012    Years since quitting: 7.2  . Smokeless tobacco: Former Network engineer Use Topics  . Alcohol use: Yes    Alcohol/week: 1.0 standard drinks    Types: 1 Standard drinks or equivalent per week    Comment: occasionally  . Drug use: No    Review of Systems Constitutional: Positive fever ENT: Positive sore throat.  Positive nasal congestion. Cardiovascular: Denies chest pain. Respiratory: As above. Gastrointestinal: No abdominal pain.  No nausea, no vomiting.  No diarrhea.   Genitourinary: Negative for dysuria. Musculoskeletal: Negative for back pain. Skin: Negative for rash. Neurological: Negative for focal weakness or numbness.   ____________________________________________   PHYSICAL EXAM:  VITAL SIGNS: ED Triage Vitals  Enc Vitals Group     BP 04/19/19 1719 122/63     Pulse Rate 04/19/19 1719 89     Resp 04/19/19 1719 18     Temp 04/19/19 1719 98.4 F (36.9 C)     Temp Source 04/19/19 1719 Oral     SpO2 04/19/19 1719 99 %     Weight 04/19/19 1717 160 lb (72.6 kg)     Height 04/19/19 1717 5\' 9"  (1.753 m)     Head Circumference --      Peak Flow --      Pain Score 04/19/19 1717 7     Pain Loc --      Pain Edu? --      Excl. in [place]? --     Constitutional: Alert and oriented. Well appearing and in no acute distress. Eyes: Conjunctivae are normal.  Head: Atraumatic. No sinus tenderness to palpation. No swelling. No erythema.  Ears: no erythema, normal TMs bilaterally.   Nose:Nasal congestion   Mouth/Throat: Mucous membranes are moist. No pharyngeal erythema. No tonsillar swelling or exudate.  Neck: No stridor.  No cervical spine tenderness to palpation.  Hematological/Lymphatic/Immunilogical: No cervical lymphadenopathy. Cardiovascular: Normal rate, regular rhythm. Grossly normal heart sounds.  Good peripheral circulation. Respiratory: Normal respiratory effort.  No retractions. No wheezes, rales or rhonchi. Good air movement.  Frequent dry cough noted in room. Musculoskeletal: Ambulatory with steady gait.  No extremity edema noted bilaterally. Neurologic:  Normal speech and language. No gait instability. Skin:  Skin appears warm, dry and intact. No rash noted. Psychiatric: Mood and affect are normal. Speech and behavior are normal. ___________________________________________   LABS (all labs ordered are listed, but only abnormal results are displayed)  Labs Reviewed - No data to display ____________________________________________  RADIOLOGY  Dg Chest 2 View  Result Date: 04/19/2019 CLINICAL DATA:  Cough and fever EXAM: CHEST -  2 VIEW COMPARISON:  04/05/2018 FINDINGS: The cardiac silhouette is stable. There is no pneumothorax. There is pleuroparenchymal scarring at the lung apices. No large pleural effusion. There is no acute osseous abnormality detected. IMPRESSION: No active cardiopulmonary disease. Electronically Signed   By: Constance Holster M.D.   On: 04/19/2019 17:41   ____________________________________________   PROCEDURES Procedures    INITIAL IMPRESSION / ASSESSMENT AND PLAN / ED COURSE  Pertinent labs & imaging results that were available during my care of the patient were reviewed by me and considered in my medical decision making (see chart for details).  Overall well-appearing patient.  No acute distress.  Coughing congestion complaints.  Chest x-ray as above, no active cardiopulmonary disease.  Lungs clear throughout at this time, vital signs stable.  Suspect viral illness.  PRN guaifenesin with codeine, PRN Tessalon Perles and as needed albuterol inhaler given.  Patient states that she is tolerated codeine well  in the past.  Albuterol use as needed at night.  Parent Tylenol ibuprofen.  Discussed with patient recommend for COVID-19 testing, COVID-19 testing order done and patient to have test tomorrow.  [place] DHHS information given and recommend for patient to adhere.  Remain home until testing results received and further directed.  Encourage very close monitoring.  Proceed directly to emergency room for any chest pain, shortness of breath or worsening concerns.  Discussed follow up with Primary care physician this week by phone. Discussed follow up and return parameters including no resolution or any worsening concerns. Patient verbalized understanding and agreed to plan.   ____________________________________________   FINAL CLINICAL IMPRESSION(S) / ED DIAGNOSES  Final diagnoses:  Cough  Fever, unspecified  Viral illness  Advice Given About Covid-19 Virus Infection     ED Discharge Orders         Ordered    guaiFENesin-codeine 100-10 MG/5ML syrup  At bedtime PRN     04/19/19 1809    albuterol (VENTOLIN HFA) 108 (90 Base) MCG/ACT inhaler  Every 4 hours PRN     04/19/19 1809    benzonatate (TESSALON PERLES) 100 MG capsule  3 times daily PRN     04/19/19 1809           Note: This dictation was prepared with Dragon dictation along with smaller phrase technology. Any transcriptional errors that result from this process are unintentional.         Marylene Land, NP 04/19/19 1901

## 2019-04-19 NOTE — ED Triage Notes (Signed)
Patient complains of cough, fever, shortness of breath and states that she has been coughing for 2.5 weeks. States that fever started on Friday and broke Monday. Reports that she has continued to feel pain when taking a breath.

## 2019-04-19 NOTE — Telephone Encounter (Signed)
Call from [facility] # [phone]. Pt scheduled for COVID-19 test and order placed per order of Marylene Land NP.

## 2019-04-19 NOTE — ED Notes (Signed)
Patient scheduled for Coronavirus testing for 04/20/19 at 10:45 am at the [facility] thru testing site in [place].

## 2019-04-19 NOTE — Discharge Instructions (Addendum)
Take medication as prescribed. Rest. Drink plenty of fluids.  Continue Tylenol and ibuprofen as needed for fever.  Monitor yourself very close.  You are scheduled to have COVID testing tomorrow morning at 1045.  They will call.  Follow up with your primary care physician this week by phone.  Return to urgent care as needed.  As discussed proceed directly to the emergency room for chest pain, shortness of breath, worsening concerns.

## 2019-04-20 ENCOUNTER — Other Ambulatory Visit: Payer: BLUE CROSS/BLUE SHIELD

## 2019-04-20 DIAGNOSIS — Z20822 Contact with and (suspected) exposure to covid-19: Secondary | ICD-10-CM

## 2019-04-21 LAB — NOVEL CORONAVIRUS, NAA: SARS-CoV-2, NAA: NOT DETECTED

## 2019-04-24 ENCOUNTER — Telehealth: Payer: Self-pay | Admitting: Family Medicine

## 2019-04-24 NOTE — Telephone Encounter (Signed)
I spoke with pt, she will pick up today. I left the letter up front for pick up.

## 2019-04-24 NOTE — Telephone Encounter (Signed)
Letter done, in IN box

## 2019-07-19 ENCOUNTER — Ambulatory Visit
Admission: EM | Admit: 2019-07-19 | Discharge: 2019-07-19 | Disposition: A | Discharge status: Home / Self Care | Payer: BLUE CROSS/BLUE SHIELD | Attending: Family Medicine | Admitting: Family Medicine

## 2019-07-19 ENCOUNTER — Encounter: Payer: Self-pay | Admitting: Emergency Medicine

## 2019-07-19 ENCOUNTER — Other Ambulatory Visit: Payer: Self-pay

## 2019-07-19 DIAGNOSIS — H6502 Acute serous otitis media, left ear: Secondary | ICD-10-CM | POA: Diagnosis not present

## 2019-07-19 DIAGNOSIS — R509 Fever, unspecified: Secondary | ICD-10-CM

## 2019-07-19 DIAGNOSIS — R0981 Nasal congestion: Secondary | ICD-10-CM | POA: Diagnosis not present

## 2019-07-19 DIAGNOSIS — J0110 Acute frontal sinusitis, unspecified: Secondary | ICD-10-CM

## 2019-07-19 DIAGNOSIS — J029 Acute pharyngitis, unspecified: Secondary | ICD-10-CM | POA: Diagnosis not present

## 2019-07-19 DIAGNOSIS — Z87891 Personal history of nicotine dependence: Secondary | ICD-10-CM

## 2019-07-19 DIAGNOSIS — Z7189 Other specified counseling: Secondary | ICD-10-CM | POA: Diagnosis not present

## 2019-07-19 LAB — RAPID STREP SCREEN (MED CTR MEBANE ONLY): Streptococcus, Group A Screen (Direct): NEGATIVE

## 2019-07-19 MED ORDER — AMOXICILLIN 875 MG PO TABS: 875.0000 mg | ORAL_TABLET | Freq: Two times a day (BID) | ORAL | 0 refills | Status: DC

## 2019-07-19 NOTE — ED Triage Notes (Signed)
Patient states she has had sinus congestion and pressure that started 2 weeks ago. She states she had a fever yesterday of 100.5. She also reports a sore throat, mild cough, generalized body aches, facial pain and right ear pain.

## 2019-07-19 NOTE — ED Provider Notes (Signed)
[facility]    CSN: [id number] Arrival date & time: 07/19/19  1624      History   Chief Complaint Chief Complaint  Patient presents with  . Sore Throat  . Nasal Congestion  . Fever    HPI Pamela Calhoun is a 33 y.o. female.   33 yo female with a c/o sinus congestion, pressure and headache as well as left ear pain for the past 2 weeks. States yesterday she had a fever and mild cough. Denies any chest pains or shortness of breath.    Sore Throat  Fever   Past Medical History:  Diagnosis Date  . Allergy   . Cancer ([place])    skin- hystory of melanoma   . Depression   . Endometriosis   . Fracture    right foot  . Frequent UTI   . History of shingles   . Interstitial cystitis   . Pap smear, abnormal    cryosx   . PID (acute pelvic inflammatory disease)    hx of  . Ureteral reflux    as a child     Patient Active Problem List   Diagnosis Date Noted  . BV (bacterial vaginosis) 07/28/2018  . Cough 04/05/2018  . Routine general medical examination at a health care facility 03/24/2018  . Hydradenitis 10/26/2017  . Screen for STD (sexually transmitted disease) 01/23/2015  . MRSA (methicillin resistant Staphylococcus aureus) carrier 04/12/2014  . INTERSTITIAL CYSTITIS 06/25/2010  . Allergic rhinitis 03/22/2007  . SKIN CANCER, HX OF 03/22/2007    Past Surgical History:  Procedure Laterality Date  . DILATION AND CURETTAGE OF UTERUS    . EXCISION OF SKIN TAG N/A 02/11/2016   Procedure: EXCISION OF ANAL SKIN TAG AN INTERNAL PAPILLA ;  Surgeon: Leighton Ruff, MD;  Location: [facility];  Service: General;  Laterality: N/A;  . LAPAROSCOPY     pelvic   . MELANOMA EXCISION     Right leg   . TONSILLECTOMY      OB History    Gravida  2   Para  0   Term      Preterm      AB  2   Living        SAB  1   TAB  1   Ectopic      Multiple      Live Births               Home Medications    Prior to Admission  medications   Medication Sig Start Date End Date Taking? Authorizing Provider  albuterol (PROVENTIL HFA;VENTOLIN HFA) 108 (90 Base) MCG/ACT inhaler Inhale 2 puffs into the lungs every 4 (four) hours as needed for wheezing or shortness of breath (cough, shortness of breath or wheezing.). 11/04/18  Yes Elby Beck, FNP  ALPRAZolam Duanne Moron) 1 MG tablet  10/07/17  Yes [provider]  ELMIRON 100 MG capsule Take 2 tablets by mouth at bedtime.  02/18/17  Yes [provider]  hydrOXYzine (ATARAX/VISTARIL) 25 MG tablet Take 1 tablet (25 mg total) every 6 (six) hours by mouth. 10/05/17  Yes Avie Echevaria B, PA-C  norethindrone-ethinyl estradiol (MICROGESTIN,JUNEL,LOESTRIN) 1-20 MG-MCG tablet  06/01/18  Yes [provider]  albuterol (VENTOLIN HFA) 108 (90 Base) MCG/ACT inhaler Inhale 2 puffs into the lungs every 4 (four) hours as needed for wheezing. 04/19/19   Marylene Land, NP  amoxicillin (AMOXIL) 875 MG tablet Take 1 tablet (875 mg total)  by mouth 2 (two) times daily. 07/19/19   Norval Gable, MD  benzonatate (TESSALON PERLES) 100 MG capsule Take 1 capsule (100 mg total) by mouth 3 (three) times daily as needed for cough. 04/19/19   Marylene Land, NP  guaiFENesin-codeine 100-10 MG/5ML syrup Take 5 mLs by mouth at bedtime as needed for cough. Do not drive or operate machinery as can cause drowsiness. 04/19/19   Marylene Land, NP  ondansetron (ZOFRAN-ODT) 4 MG disintegrating tablet Take 1 tablet (4 mg total) by mouth every 8 (eight) hours as needed for nausea or vomiting. 01/31/19   Coral Spikes, DO    Family History Family History  Problem Relation Age of Onset  . Cancer Maternal Grandmother        breast    Social History Social History   Tobacco Use  . Smoking status: Former Smoker    Quit date: 02/04/2012    Years since quitting: 7.4  . Smokeless tobacco: Former Network engineer Use Topics  . Alcohol use: Yes    Alcohol/week: 1.0 standard drinks     Types: 1 Standard drinks or equivalent per week    Comment: occasionally  . Drug use: No     Allergies   Cetirizine hcl and Vicodin [hydrocodone-acetaminophen]   Review of Systems Review of Systems  Constitutional: Positive for fever.     Physical Exam Triage Vital Signs ED Triage Vitals  Enc Vitals Group     BP 07/19/19 1639 124/78     Pulse Rate 07/19/19 1639 90     Resp 07/19/19 1639 18     Temp 07/19/19 1639 98.8 F (37.1 C)     Temp Source 07/19/19 1639 Oral     SpO2 07/19/19 1639 97 %     Weight 07/19/19 1637 172 lb (78 kg)     Height 07/19/19 1637 5\' 10"  (1.778 m)     Head Circumference --      Peak Flow --      Pain Score 07/19/19 1637 5     Pain Loc --      Pain Edu? --      Excl. in [place]? --    No data found.  Updated Vital Signs BP 124/78 (BP Location: Right Arm)   Pulse 90   Temp 98.8 F (37.1 C) (Oral)   Resp 18   Ht 5\' 10"  (1.778 m)   Wt 78 kg   LMP 07/05/2019   SpO2 97%   BMI 24.68 kg/m   Visual Acuity Right Eye Distance:   Left Eye Distance:   Bilateral Distance:    Right Eye Near:   Left Eye Near:    Bilateral Near:     Physical Exam Vitals signs and nursing note reviewed.  Constitutional:      General: She is not in acute distress.    Appearance: She is not toxic-appearing or diaphoretic.  HENT:     Left Ear: A middle ear effusion is present. Tympanic membrane is erythematous and bulging.     Nose: Congestion present.     Right Sinus: Frontal sinus tenderness present.     Left Sinus: Frontal sinus tenderness present.     Mouth/Throat:     Pharynx: Uvula midline. Posterior oropharyngeal erythema present. No oropharyngeal exudate.     Tonsils: No tonsillar exudate or tonsillar abscesses.  Cardiovascular:     Rate and Rhythm: Normal rate.  Pulmonary:     Effort: Pulmonary effort is normal. No respiratory distress.  Breath sounds: Normal breath sounds.  Neurological:     Mental Status: She is alert.      UC Treatments  / Results  Labs (all labs ordered are listed, but only abnormal results are displayed) Labs Reviewed  RAPID STREP SCREEN ([facility] ONLY)  NOVEL CORONAVIRUS, NAA (HOSP ORDER, SEND-OUT TO REF LAB; TAT 18-24 HRS)  CULTURE, GROUP A STREP [facility])    EKG   Radiology No results found.  Procedures Procedures (including critical care time)  Medications Ordered in UC Medications - No data to display  Initial Impression / Assessment and Plan / UC Course  I have reviewed the triage vital signs and the nursing notes.  Pertinent labs & imaging results that were available during my care of the patient were reviewed by me and considered in my medical decision making (see chart for details).      Final Clinical Impressions(s) / UC Diagnoses   Final diagnoses:  Acute frontal sinusitis, recurrence not specified  Acute serous otitis media of left ear, recurrence not specified  Advice Given About Covid-19 Virus Infection     Discharge Instructions     Self-isolate Await test result    ED Prescriptions    Medication Sig Dispense Auth. Provider   amoxicillin (AMOXIL) 875 MG tablet Take 1 tablet (875 mg total) by mouth 2 (two) times daily. 20 tablet Norval Gable, MD      1. Lab result and diagnosis reviewed with patient 2. rx as per orders above; reviewed possible side effects, interactions, risks and benefits  3. Recommend supportive treatment with rest, fluids, otc analgesics prn 4. Follow-up prn if symptoms worsen or don't improve  Controlled Substance Prescriptions [place] Controlled Substance Registry consulted? Not Applicable   Norval Gable, MD 07/19/19 [phone]

## 2019-07-19 NOTE — Discharge Instructions (Signed)
Self-isolate Await test result

## 2019-07-20 LAB — NOVEL CORONAVIRUS, NAA (HOSP ORDER, SEND-OUT TO REF LAB; TAT 18-24 HRS): SARS-CoV-2, NAA: NOT DETECTED

## 2019-07-22 LAB — CULTURE, GROUP A STREP (THRC)

## 2019-07-25 DIAGNOSIS — C439 Malignant melanoma of skin, unspecified: Secondary | ICD-10-CM | POA: Insufficient documentation

## 2019-07-25 DIAGNOSIS — Z85820 Personal history of malignant melanoma of skin: Secondary | ICD-10-CM | POA: Insufficient documentation

## 2019-12-08 ENCOUNTER — Ambulatory Visit
Admission: EM | Admit: 2019-12-08 | Discharge: 2019-12-08 | Disposition: A | Discharge status: Home / Self Care | Payer: BLUE CROSS/BLUE SHIELD | Attending: Family Medicine | Admitting: Family Medicine

## 2019-12-08 ENCOUNTER — Encounter: Payer: Self-pay | Admitting: Emergency Medicine

## 2019-12-08 ENCOUNTER — Other Ambulatory Visit: Payer: Self-pay

## 2019-12-08 DIAGNOSIS — Z792 Long term (current) use of antibiotics: Secondary | ICD-10-CM | POA: Insufficient documentation

## 2019-12-08 DIAGNOSIS — Z79899 Other long term (current) drug therapy: Secondary | ICD-10-CM | POA: Diagnosis not present

## 2019-12-08 DIAGNOSIS — Z885 Allergy status to narcotic agent status: Secondary | ICD-10-CM | POA: Diagnosis not present

## 2019-12-08 DIAGNOSIS — Z85820 Personal history of malignant melanoma of skin: Secondary | ICD-10-CM | POA: Diagnosis not present

## 2019-12-08 DIAGNOSIS — Z803 Family history of malignant neoplasm of breast: Secondary | ICD-10-CM | POA: Diagnosis not present

## 2019-12-08 DIAGNOSIS — J0100 Acute maxillary sinusitis, unspecified: Secondary | ICD-10-CM | POA: Diagnosis present

## 2019-12-08 DIAGNOSIS — Z20822 Contact with and (suspected) exposure to covid-19: Secondary | ICD-10-CM | POA: Diagnosis not present

## 2019-12-08 DIAGNOSIS — Z87891 Personal history of nicotine dependence: Secondary | ICD-10-CM | POA: Diagnosis not present

## 2019-12-08 DIAGNOSIS — F329 Major depressive disorder, single episode, unspecified: Secondary | ICD-10-CM | POA: Diagnosis not present

## 2019-12-08 DIAGNOSIS — Z888 Allergy status to other drugs, medicaments and biological substances status: Secondary | ICD-10-CM | POA: Insufficient documentation

## 2019-12-08 DIAGNOSIS — R6889 Other general symptoms and signs: Secondary | ICD-10-CM | POA: Diagnosis not present

## 2019-12-08 DIAGNOSIS — Z793 Long term (current) use of hormonal contraceptives: Secondary | ICD-10-CM | POA: Diagnosis not present

## 2019-12-08 MED ORDER — AMOXICILLIN 875 MG PO TABS: 875.0000 mg | ORAL_TABLET | Freq: Two times a day (BID) | ORAL | 0 refills | Status: DC

## 2019-12-08 NOTE — Discharge Instructions (Addendum)
Rest, fluids, over the counter medications as needed  

## 2019-12-08 NOTE — ED Provider Notes (Signed)
[facility]    CSN: [id number] Arrival date & time: 12/08/19  1833      History   Chief Complaint Chief Complaint  Patient presents with  . Cough  . Sore Throat  . Generalized Body Aches    HPI Pamela Calhoun is a 34 y.o. female.   34 yo female with a c/o nasal congestion and worsening sinus pressure and headaches for the past 2 weeks. States she has a h/o sinus infections and these symptoms are similar.  States since yesterday she's developed new symptoms of fevers, body aches, chills, and cough. States possible recent covid exposure.    Cough Sore Throat    Past Medical History:  Diagnosis Date  . Allergy   . Cancer ([place])    skin- hystory of melanoma   . Depression   . Endometriosis   . Fracture    right foot  . Frequent UTI   . History of shingles   . Interstitial cystitis   . Pap smear, abnormal    cryosx   . PID (acute pelvic inflammatory disease)    hx of  . Ureteral reflux    as a child     Patient Active Problem List   Diagnosis Date Noted  . BV (bacterial vaginosis) 07/28/2018  . Cough 04/05/2018  . Routine general medical examination at a health care facility 03/24/2018  . Hydradenitis 10/26/2017  . Screen for STD (sexually transmitted disease) 01/23/2015  . MRSA (methicillin resistant Staphylococcus aureus) carrier 04/12/2014  . INTERSTITIAL CYSTITIS 06/25/2010  . Allergic rhinitis 03/22/2007  . SKIN CANCER, HX OF 03/22/2007    Past Surgical History:  Procedure Laterality Date  . DILATION AND CURETTAGE OF UTERUS    . EXCISION OF SKIN TAG N/A 02/11/2016   Procedure: EXCISION OF ANAL SKIN TAG AN INTERNAL PAPILLA ;  Surgeon: Leighton Ruff, MD;  Location: [facility];  Service: General;  Laterality: N/A;  . LAPAROSCOPY     pelvic   . MELANOMA EXCISION     Right leg   . TONSILLECTOMY      OB History    Gravida  2   Para  0   Term      Preterm      AB  2   Living        SAB  1   TAB  1   Ectopic      Multiple      Live Births               Home Medications    Prior to Admission medications   Medication Sig Start Date End Date Taking? Authorizing Provider  albuterol (VENTOLIN HFA) 108 (90 Base) MCG/ACT inhaler Inhale 2 puffs into the lungs every 4 (four) hours as needed for wheezing. 04/19/19  Yes Marylene Land, NP  ALPRAZolam Duanne Moron) 1 MG tablet  10/07/17  Yes [provider]  ELMIRON 100 MG capsule Take 2 tablets by mouth at bedtime.  02/18/17  Yes [provider]  hydrOXYzine (ATARAX/VISTARIL) 25 MG tablet Take 1 tablet (25 mg total) every 6 (six) hours by mouth. 10/05/17  Yes Avie Echevaria B, PA-C  norethindrone-ethinyl estradiol (MICROGESTIN,JUNEL,LOESTRIN) 1-20 MG-MCG tablet  06/01/18  Yes [provider]  albuterol (PROVENTIL HFA;VENTOLIN HFA) 108 (90 Base) MCG/ACT inhaler Inhale 2 puffs into the lungs every 4 (four) hours as needed for wheezing or shortness of breath (cough, shortness of breath or wheezing.). 11/04/18   Elby Beck, FNP  amoxicillin (AMOXIL) 875 MG tablet Take 1 tablet (875 mg total) by mouth 2 (two) times daily. 12/08/19   Norval Gable, MD  benzonatate (TESSALON PERLES) 100 MG capsule Take 1 capsule (100 mg total) by mouth 3 (three) times daily as needed for cough. 04/19/19   Marylene Land, NP  guaiFENesin-codeine 100-10 MG/5ML syrup Take 5 mLs by mouth at bedtime as needed for cough. Do not drive or operate machinery as can cause drowsiness. 04/19/19   Marylene Land, NP  ondansetron (ZOFRAN-ODT) 4 MG disintegrating tablet Take 1 tablet (4 mg total) by mouth every 8 (eight) hours as needed for nausea or vomiting. 01/31/19   Coral Spikes, DO    Family History Family History  Problem Relation Age of Onset  . Cancer Maternal Grandmother        breast    Social History Social History   Tobacco Use  . Smoking status: Former Smoker    Quit date: 02/04/2012    Years since quitting: 7.8  . Smokeless  tobacco: Former Network engineer Use Topics  . Alcohol use: Yes    Alcohol/week: 1.0 standard drinks    Types: 1 Standard drinks or equivalent per week    Comment: occasionally  . Drug use: No     Allergies   Cetirizine hcl and Vicodin [hydrocodone-acetaminophen]   Review of Systems Review of Systems  Respiratory: Positive for cough.      Physical Exam Triage Vital Signs ED Triage Vitals  Enc Vitals Group     BP 12/08/19 1848 127/87     Pulse Rate 12/08/19 1848 97     Resp 12/08/19 1848 14     Temp 12/08/19 1848 98.3 F (36.8 C)     Temp Source 12/08/19 1848 Oral     SpO2 12/08/19 1848 97 %     Weight 12/08/19 1844 170 lb (77.1 kg)     Height 12/08/19 1844 5\' 10"  (1.778 m)     Head Circumference --      Peak Flow --      Pain Score 12/08/19 1843 7     Pain Loc --      Pain Edu? --      Excl. in [place]? --    No data found.  Updated Vital Signs BP 127/87 (BP Location: Left Arm)   Pulse 97   Temp 98.3 F (36.8 C) (Oral)   Resp 14   Ht 5\' 10"  (1.778 m)   Wt 77.1 kg   SpO2 97%   BMI 24.39 kg/m   Visual Acuity Right Eye Distance:   Left Eye Distance:   Bilateral Distance:    Right Eye Near:   Left Eye Near:    Bilateral Near:     Physical Exam Vitals and nursing note reviewed.  Constitutional:      General: She is not in acute distress.    Appearance: She is not toxic-appearing or diaphoretic.  HENT:     Right Ear: Tympanic membrane normal.     Left Ear: Tympanic membrane normal.     Nose: Congestion present.     Right Sinus: Maxillary sinus tenderness present.     Left Sinus: Maxillary sinus tenderness present.  Cardiovascular:     Rate and Rhythm: Normal rate.     Heart sounds: Normal heart sounds.  Pulmonary:     Effort: Pulmonary effort is normal. No respiratory distress.     Breath sounds: Normal breath sounds.  Neurological:     Mental  Status: She is alert.      UC Treatments / Results  Labs (all labs ordered are listed, but only  abnormal results are displayed) Labs Reviewed  NOVEL CORONAVIRUS, NAA (HOSP ORDER, SEND-OUT TO REF LAB; TAT 18-24 HRS)    EKG   Radiology No results found.  Procedures Procedures (including critical care time)  Medications Ordered in UC Medications - No data to display  Initial Impression / Assessment and Plan / UC Course  I have reviewed the triage vital signs and the nursing notes.  Pertinent labs & imaging results that were available during my care of the patient were reviewed by me and considered in my medical decision making (see chart for details).     Final Clinical Impressions(s) / UC Diagnoses   Final diagnoses:  Acute maxillary sinusitis, recurrence not specified  Flu-like symptoms     Discharge Instructions     Rest, fluids, over the counter medications as needed    ED Prescriptions    Medication Sig Dispense Auth. Provider   amoxicillin (AMOXIL) 875 MG tablet Take 1 tablet (875 mg total) by mouth 2 (two) times daily. 20 tablet Norval Gable, MD      1. diagnosis reviewed with patient 2. rx as per orders above; reviewed possible side effects, interactions, risks and benefits  3. Recommend supportive treatment as above 4. covid test done 5. Follow-up prn if symptoms worsen or don't improve   PDMP not reviewed this encounter.   Norval Gable, MD 12/08/19 1940

## 2019-12-08 NOTE — ED Triage Notes (Signed)
Patient c/o cough, congestion, bodyaches, runny nose, and headaches and low grade fever that started couple of days ago.  Patient states that she was sent home from work today due to [place] like symptoms.

## 2019-12-10 LAB — NOVEL CORONAVIRUS, NAA (HOSP ORDER, SEND-OUT TO REF LAB; TAT 18-24 HRS): SARS-CoV-2, NAA: NOT DETECTED

## 2020-01-17 ENCOUNTER — Ambulatory Visit
Admission: EM | Admit: 2020-01-17 | Discharge: 2020-01-17 | Disposition: A | Discharge status: Home / Self Care | Payer: BLUE CROSS/BLUE SHIELD | Attending: Family Medicine | Admitting: Family Medicine

## 2020-01-17 ENCOUNTER — Encounter: Payer: Self-pay | Admitting: Emergency Medicine

## 2020-01-17 ENCOUNTER — Other Ambulatory Visit: Payer: Self-pay

## 2020-01-17 DIAGNOSIS — K529 Noninfective gastroenteritis and colitis, unspecified: Secondary | ICD-10-CM | POA: Diagnosis not present

## 2020-01-17 MED ORDER — ONDANSETRON HCL 4 MG PO TABS: 4.0000 mg | ORAL_TABLET | Freq: Three times a day (TID) | ORAL | 0 refills | Status: DC | PRN

## 2020-01-17 NOTE — ED Triage Notes (Addendum)
Patient in today c/o nausea, vomiting and diarrhea since ~2am this morning. Patient's last episode of emesis was 2pm this afternoon. Patient's last episode of diarrhea was ~11:00 am this morning. Patient denies any other symptoms.

## 2020-01-17 NOTE — Discharge Instructions (Signed)
Fluids.  Rest.  Zofran as needed.  Take care  Dr. Lacinda Axon

## 2020-01-17 NOTE — ED Provider Notes (Signed)
[facility]    CSN: [id number] Arrival date & time: 01/17/20  [id number]      History   Chief Complaint Chief Complaint  Patient presents with  . Nausea  . Emesis  . Diarrhea   HPI   34 year old female presents with nausea, vomiting, diarrhea.  Started abruptly earlier this morning around 2 AM.  She reports that the diarrhea and vomiting has resolved.  She reports some associated abdominal pain particularly in the upper abdomen.  She still has some nausea.  She has been able to tolerate fluids.  No fever.  No reported sick contacts.  Patient is requesting something for nausea.  No relieving factors.  No known exacerbating factors.  No other associated symptoms.  Past Medical History:  Diagnosis Date  . Allergy   . Cancer ([place])    skin- hystory of melanoma   . Depression   . Endometriosis   . Fracture    right foot  . Frequent UTI   . History of shingles   . Interstitial cystitis   . Pap smear, abnormal    cryosx   . PID (acute pelvic inflammatory disease)    hx of  . Ureteral reflux    as a child     Patient Active Problem List   Diagnosis Date Noted  . BV (bacterial vaginosis) 07/28/2018  . Cough 04/05/2018  . Routine general medical examination at a health care facility 03/24/2018  . Hydradenitis 10/26/2017  . Screen for STD (sexually transmitted disease) 01/23/2015  . MRSA (methicillin resistant Staphylococcus aureus) carrier 04/12/2014  . INTERSTITIAL CYSTITIS 06/25/2010  . Allergic rhinitis 03/22/2007  . SKIN CANCER, HX OF 03/22/2007    Past Surgical History:  Procedure Laterality Date  . DILATION AND CURETTAGE OF UTERUS    . EXCISION OF SKIN TAG N/A 02/11/2016   Procedure: EXCISION OF ANAL SKIN TAG AN INTERNAL PAPILLA ;  Surgeon: Leighton Ruff, MD;  [facility];  Service: General;  Laterality: N/A;  . LAPAROSCOPY     pelvic   . MELANOMA EXCISION     Right leg   . TONSILLECTOMY      OB History    Gravida  2   Para  0   Term      Preterm      AB  2   Living        SAB  1   TAB  1   Ectopic      Multiple      Live Births               Home Medications    Prior to Admission medications   Medication Sig Start Date End Date Taking? Authorizing Provider  albuterol (VENTOLIN HFA) 108 (90 Base) MCG/ACT inhaler Inhale 2 puffs into the lungs every 4 (four) hours as needed for wheezing. 04/19/19  Yes Marylene Land, NP  ALPRAZolam Duanne Moron) 1 MG tablet  10/07/17  Yes [provider]  ELMIRON 100 MG capsule Take 2 tablets by mouth at bedtime.  02/18/17  Yes [provider]  hydrOXYzine (ATARAX/VISTARIL) 25 MG tablet Take 1 tablet (25 mg total) every 6 (six) hours by mouth. 10/05/17  Yes Avie Echevaria B, PA-C  norethindrone-ethinyl estradiol (MICROGESTIN,JUNEL,LOESTRIN) 1-20 MG-MCG tablet  06/01/18  Yes [provider]  ondansetron (ZOFRAN) 4 MG tablet Take 1 tablet (4 mg total) by mouth every 8 (eight) hours as needed for nausea or vomiting. 01/17/20   Coral Spikes, DO  Family History Family History  Problem Relation Age of Onset  . Cancer Maternal Grandmother        breast  . Diabetes Mother   . Hypertension Mother   . Neuropathy Mother        diabetic  . Depression Mother   . Asthma Father     Social History Social History   Tobacco Use  . Smoking status: Former Smoker    Quit date: 02/04/2012    Years since quitting: 7.9  . Smokeless tobacco: Never Used  Substance Use Topics  . Alcohol use: Yes    Alcohol/week: 1.0 standard drinks    Types: 1 Standard drinks or equivalent per week    Comment: occasionally  . Drug use: No     Allergies   Cetirizine hcl and Vicodin [hydrocodone-acetaminophen]   Review of Systems Review of Systems  Constitutional: Positive for appetite change.  Gastrointestinal: Positive for diarrhea, nausea and vomiting.   Physical Exam Triage Vital Signs ED Triage Vitals  Enc Vitals Group     BP 01/17/20  1933 123/84     Pulse Rate 01/17/20 1933 71     Resp 01/17/20 1933 18     Temp 01/17/20 1933 98.2 F (36.8 C)     Temp Source 01/17/20 1933 Oral     SpO2 01/17/20 1933 100 %     Weight 01/17/20 1933 170 lb (77.1 kg)     Height 01/17/20 1933 5\' 10"  (1.778 m)     Head Circumference --      Peak Flow --      Pain Score 01/17/20 1932 4     Pain Loc --      Pain Edu? --      Excl. in [place]? --    Updated Vital Signs BP 123/84 (BP Location: Left Arm)   Pulse 71   Temp 98.2 F (36.8 C) (Oral)   Resp 18   Ht 5\' 10"  (1.778 m)   Wt 77.1 kg   LMP 12/27/2019 (Approximate)   SpO2 100%   BMI 24.39 kg/m   Visual Acuity Right Eye Distance:   Left Eye Distance:   Bilateral Distance:    Right Eye Near:   Left Eye Near:    Bilateral Near:     Physical Exam Vitals and nursing note reviewed.  Constitutional:      General: She is not in acute distress.    Appearance: Normal appearance. She is not ill-appearing.  HENT:     Head: Normocephalic and atraumatic.  Eyes:     General:        Right eye: No discharge.        Left eye: No discharge.     Conjunctiva/sclera: Conjunctivae normal.  Cardiovascular:     Rate and Rhythm: Normal rate and regular rhythm.     Heart sounds: No murmur.  Pulmonary:     Effort: Pulmonary effort is normal.     Breath sounds: Normal breath sounds. No wheezing, rhonchi or rales.  Abdominal:     General: There is no distension.     Palpations: Abdomen is soft.     Comments: Mild epigastric tenderness.   Neurological:     Mental Status: She is alert.  Psychiatric:        Mood and Affect: Mood normal.        Behavior: Behavior normal.    UC Treatments / Results  Labs (all labs ordered are listed, but only abnormal results are displayed) Labs  Reviewed - No data to display  EKG   Radiology No results found.  Procedures Procedures (including critical care time)  Medications Ordered in UC Medications - No data to display  Initial Impression /  Assessment and Plan / UC Course  I have reviewed the triage vital signs and the nursing notes.  Pertinent labs & imaging results that were available during my care of the patient were reviewed by me and considered in my medical decision making (see chart for details).    34 year old female presents with gastroenteritis.  Zofran as needed.  Advised to push fluids.  Work note given.  Supportive care.  Final Clinical Impressions(s) / UC Diagnoses   Final diagnoses:  Gastroenteritis     Discharge Instructions     Fluids.  Rest.  Zofran as needed.  Take care  Dr. Lacinda Axon    ED Prescriptions    Medication Sig Dispense Auth. Provider   ondansetron (ZOFRAN) 4 MG tablet Take 1 tablet (4 mg total) by mouth every 8 (eight) hours as needed for nausea or vomiting. 20 tablet Coral Spikes, DO     PDMP not reviewed this encounter.   Coral Spikes, [place] 01/17/20 2012

## 2020-02-04 ENCOUNTER — Other Ambulatory Visit: Payer: Self-pay

## 2020-02-04 ENCOUNTER — Ambulatory Visit
Admission: EM | Admit: 2020-02-04 | Discharge: 2020-02-04 | Disposition: A | Discharge status: Home / Self Care | Payer: BLUE CROSS/BLUE SHIELD | Attending: Family Medicine | Admitting: Family Medicine

## 2020-02-04 DIAGNOSIS — J0111 Acute recurrent frontal sinusitis: Secondary | ICD-10-CM | POA: Diagnosis not present

## 2020-02-04 DIAGNOSIS — J0101 Acute recurrent maxillary sinusitis: Secondary | ICD-10-CM

## 2020-02-04 MED ORDER — AMOXICILLIN-POT CLAVULANATE 875-125 MG PO TABS: 1.0000 | ORAL_TABLET | Freq: Two times a day (BID) | ORAL | 0 refills | Status: AC

## 2020-02-04 NOTE — Discharge Instructions (Addendum)
Please continue with over-the-counter decongestant and antihistamine until symptoms resolve. Take antibiotic as prescribed. He may also try intranasal spray Flonase as needed. You may also consider using a Nettie pot to help flush the sinuses. Follow-up with ENT if no improvement.

## 2020-02-04 NOTE — ED Triage Notes (Signed)
Pt presents with c/o sinus pain/pressure, jaw pain, headache and bilateral ear pressure/pain for the past week and a half but increasing since Friday. Pt has taken OTC sinus meds with little relief. Pt denies fever/chills, n/v/d, cough or other symptoms. Pt has recurrent sinus infections.

## 2020-02-04 NOTE — ED Provider Notes (Signed)
[facility]    CSN: [id number] Arrival date & time: 02/04/20  1444      History   Chief Complaint Chief Complaint  Patient presents with  . Sinus Problem    HPI Pamela Calhoun is a 34 y.o. female presents to the urgent care facility for evaluation of bilateral frontal and maxillary sinus pain pressure, headache and bilateral ear pain and pressure. Had symptoms several months ago, responded well to amoxicillin. Patient states she has had several recurrent sinus infections, all lasting 7 to 10 days and resolve within 1 to 2 days of taking the antibiotics. She has been taken over-the-counter decongestant and antihistamines. No fevers. No cough shortness of breath.   HPI  Past Medical History:  Diagnosis Date  . Allergy   . Cancer ([place])    skin- hystory of melanoma   . Depression   . Endometriosis   . Fracture    right foot  . Frequent UTI   . History of shingles   . Interstitial cystitis   . Pap smear, abnormal    cryosx   . PID (acute pelvic inflammatory disease)    hx of  . Ureteral reflux    as a child     Patient Active Problem List   Diagnosis Date Noted  . BV (bacterial vaginosis) 07/28/2018  . Cough 04/05/2018  . Routine general medical examination at a health care facility 03/24/2018  . Hydradenitis 10/26/2017  . Screen for STD (sexually transmitted disease) 01/23/2015  . MRSA (methicillin resistant Staphylococcus aureus) carrier 04/12/2014  . INTERSTITIAL CYSTITIS 06/25/2010  . Allergic rhinitis 03/22/2007  . SKIN CANCER, HX OF 03/22/2007    Past Surgical History:  Procedure Laterality Date  . DILATION AND CURETTAGE OF UTERUS    . EXCISION OF SKIN TAG N/A 02/11/2016   Procedure: EXCISION OF ANAL SKIN TAG AN INTERNAL PAPILLA ;  Surgeon: Leighton Ruff, MD;  [facility];  Service: General;  Laterality: N/A;  . LAPAROSCOPY     pelvic   . MELANOMA EXCISION     Right leg   . TONSILLECTOMY      OB History    Gravida  2   Para  0   Term      Preterm      AB  2   Living        SAB  1   TAB  1   Ectopic      Multiple      Live Births               Home Medications    Prior to Admission medications   Medication Sig Start Date End Date Taking? Authorizing Provider  albuterol (VENTOLIN HFA) 108 (90 Base) MCG/ACT inhaler Inhale 2 puffs into the lungs every 4 (four) hours as needed for wheezing. 04/19/19  Yes Marylene Land, NP  ALPRAZolam Duanne Moron) 1 MG tablet  10/07/17  Yes [provider]  ELMIRON 100 MG capsule Take 2 tablets by mouth at bedtime.  02/18/17  Yes [provider]  hydrOXYzine (ATARAX/VISTARIL) 25 MG tablet Take 1 tablet (25 mg total) every 6 (six) hours by mouth. 10/05/17  Yes Avie Echevaria B, PA-C  norethindrone-ethinyl estradiol (MICROGESTIN,JUNEL,LOESTRIN) 1-20 MG-MCG tablet  06/01/18  Yes [provider]  amoxicillin-clavulanate (AUGMENTIN) 875-125 MG tablet Take 1 tablet by mouth every 12 (twelve) hours for 10 days. 02/04/20 02/14/20  Duanne Guess, PA-C  ondansetron (ZOFRAN) 4 MG tablet Take 1 tablet (  4 mg total) by mouth every 8 (eight) hours as needed for nausea or vomiting. 01/17/20   Coral Spikes, DO    Family History Family History  Problem Relation Age of Onset  . Cancer Maternal Grandmother        breast  . Diabetes Mother   . Hypertension Mother   . Neuropathy Mother        diabetic  . Depression Mother   . Asthma Father     Social History Social History   Tobacco Use  . Smoking status: Former Smoker    Quit date: 02/04/2012    Years since quitting: 8.0  . Smokeless tobacco: Never Used  Substance Use Topics  . Alcohol use: Yes    Alcohol/week: 1.0 standard drinks    Types: 1 Standard drinks or equivalent per week    Comment: occasionally  . Drug use: No     Allergies   Cetirizine hcl and Vicodin [hydrocodone-acetaminophen]   Review of Systems Review of Systems  Constitutional: Negative for  fever.  HENT: Positive for ear pain, sinus pressure and sinus pain.   Respiratory: Negative for cough and shortness of breath.   Cardiovascular: Negative for chest pain.  Gastrointestinal: Negative for nausea and vomiting.  Skin: Negative for rash.     Physical Exam Triage Vital Signs ED Triage Vitals  Enc Vitals Group     BP 02/04/20 1457 131/77     Pulse Rate 02/04/20 1457 74     Resp --      Temp 02/04/20 1457 98.5 F (36.9 C)     Temp Source 02/04/20 1457 Oral     SpO2 02/04/20 1457 100 %     Weight 02/04/20 1455 170 lb (77.1 kg)     Height 02/04/20 1455 5\' 10"  (1.778 m)     Head Circumference --      Peak Flow --      Pain Score 02/04/20 1455 4     Pain Loc --      Pain Edu? --      Excl. in [place]? --    No data found.  Updated Vital Signs BP 131/77 (BP Location: Left Arm)   Pulse 74   Temp 98.5 F (36.9 C) (Oral)   Ht 5\' 10"  (1.778 m)   Wt 170 lb (77.1 kg)   SpO2 100%   BMI 24.39 kg/m   Visual Acuity Right Eye Distance:   Left Eye Distance:   Bilateral Distance:    Right Eye Near:   Left Eye Near:    Bilateral Near:     Physical Exam Constitutional:      General: She is not in acute distress.    Appearance: She is well-developed.  HENT:     Head: Normocephalic and atraumatic.     Jaw: No trismus.     Comments: Fluid behind both TMs with no signs of any infection. Tenderness to palpation along the frontal and maxillary sinuses bilaterally.    Right Ear: Hearing, tympanic membrane, ear canal and external ear normal.     Left Ear: Hearing, tympanic membrane, ear canal and external ear normal.     Nose: Nose normal. No rhinorrhea.     Mouth/Throat:     Pharynx: No oropharyngeal exudate, posterior oropharyngeal erythema or uvula swelling.     Tonsils: No tonsillar exudate or tonsillar abscesses.  Eyes:     General:        Right eye: No discharge.  Left eye: No discharge.     Conjunctiva/sclera: Conjunctivae normal.  Cardiovascular:     Rate  and Rhythm: Normal rate and regular rhythm.  Pulmonary:     Effort: Pulmonary effort is normal. No respiratory distress.     Breath sounds: Normal breath sounds. No stridor. No wheezing or rales.  Abdominal:     General: There is no distension.     Palpations: Abdomen is soft.     Tenderness: There is no abdominal tenderness.  Musculoskeletal:        General: No deformity. Normal range of motion.     Cervical back: Normal range of motion.  Lymphadenopathy:     Cervical: Cervical adenopathy present.  Skin:    General: Skin is warm and dry.  Neurological:     Mental Status: She is alert and oriented to person, place, and time.     Deep Tendon Reflexes: Reflexes are normal and symmetric.  Psychiatric:        Behavior: Behavior normal.        Thought Content: Thought content normal.      UC Treatments / Results  Labs (all labs ordered are listed, but only abnormal results are displayed) Labs Reviewed - No data to display  EKG   Radiology No results found.  Procedures Procedures (including critical care time)  Medications Ordered in UC Medications - No data to display  Initial Impression / Assessment and Plan / UC Course  I have reviewed the triage vital signs and the nursing notes.  Pertinent labs & imaging results that were available during my care of the patient were reviewed by me and considered in my medical decision making (see chart for details).     34 year old female with acute recurrent maxillary and frontal sinusitis. She is started on Augmentin. Continue with antihistamine and decongestant. Discussed Flonase and Nettie pot. Follow-up with ENT if no improvement. She understands signs symptoms return to ER or urgent care for. Final Clinical Impressions(s) / UC Diagnoses   Final diagnoses:  Acute recurrent maxillary sinusitis  Acute recurrent frontal sinusitis     Discharge Instructions     Please continue with over-the-counter decongestant and  antihistamine until symptoms resolve. Take antibiotic as prescribed. He may also try intranasal spray Flonase as needed. You may also consider using a Nettie pot to help flush the sinuses. Follow-up with ENT if no improvement.   ED Prescriptions    Medication Sig Dispense Auth. Provider   amoxicillin-clavulanate (AUGMENTIN) 875-125 MG tablet Take 1 tablet by mouth every 12 (twelve) hours for 10 days. 20 tablet Duanne Guess, PA-C     PDMP not reviewed this encounter.   Duanne Guess, PA-C 02/04/20 1507

## 2020-02-19 ENCOUNTER — Encounter: Payer: Self-pay | Admitting: Emergency Medicine

## 2020-02-19 ENCOUNTER — Other Ambulatory Visit: Payer: Self-pay

## 2020-02-19 ENCOUNTER — Ambulatory Visit
Admission: EM | Admit: 2020-02-19 | Discharge: 2020-02-19 | Disposition: A | Discharge status: Home / Self Care | Payer: BLUE CROSS/BLUE SHIELD | Attending: Family Medicine | Admitting: Family Medicine

## 2020-02-19 DIAGNOSIS — K529 Noninfective gastroenteritis and colitis, unspecified: Secondary | ICD-10-CM | POA: Diagnosis not present

## 2020-02-19 DIAGNOSIS — N760 Acute vaginitis: Secondary | ICD-10-CM | POA: Insufficient documentation

## 2020-02-19 DIAGNOSIS — B9689 Other specified bacterial agents as the cause of diseases classified elsewhere: Secondary | ICD-10-CM | POA: Diagnosis not present

## 2020-02-19 LAB — WET PREP, GENITAL
Trich, Wet Prep: NONE SEEN
Yeast Wet Prep HPF POC: NONE SEEN

## 2020-02-19 MED ORDER — METRONIDAZOLE 500 MG PO TABS: 500.0000 mg | ORAL_TABLET | Freq: Two times a day (BID) | ORAL | 0 refills | Status: AC

## 2020-02-19 MED ORDER — ONDANSETRON 8 MG PO TBDP: 8.0000 mg | ORAL_TABLET | Freq: Three times a day (TID) | ORAL | 0 refills | Status: DC | PRN

## 2020-02-19 NOTE — ED Provider Notes (Signed)
[facility]    CSN: [id number] Arrival date & time: 02/19/20  1828      History   Chief Complaint Chief Complaint  Patient presents with  . Vaginal Itching  . Diarrhea    HPI Pamela Calhoun is a 34 y.o. female.   34 yo female with a c/o vaginal itching and burning with slight discharge for the past week. Denies any fevers, chills or pelvic pain. Also c/o watery diarrhea and nausea for the past 4-5 days. Vomited once yesterday but none since. Denies abdominal pain, melena, hematochezia, recent travel or known sick contacts.    Vaginal Itching  Diarrhea   Past Medical History:  Diagnosis Date  . Allergy   . Cancer ([place])    skin- hystory of melanoma   . Depression   . Endometriosis   . Fracture    right foot  . Frequent UTI   . History of shingles   . Interstitial cystitis   . Pap smear, abnormal    cryosx   . PID (acute pelvic inflammatory disease)    hx of  . Ureteral reflux    as a child     Patient Active Problem List   Diagnosis Date Noted  . BV (bacterial vaginosis) 07/28/2018  . Cough 04/05/2018  . Routine general medical examination at a health care facility 03/24/2018  . Hydradenitis 10/26/2017  . Screen for STD (sexually transmitted disease) 01/23/2015  . MRSA (methicillin resistant Staphylococcus aureus) carrier 04/12/2014  . INTERSTITIAL CYSTITIS 06/25/2010  . Allergic rhinitis 03/22/2007  . SKIN CANCER, HX OF 03/22/2007    Past Surgical History:  Procedure Laterality Date  . DILATION AND CURETTAGE OF UTERUS    . EXCISION OF SKIN TAG N/A 02/11/2016   Procedure: EXCISION OF ANAL SKIN TAG AN INTERNAL PAPILLA ;  Surgeon: Leighton Ruff, MD;  Location: [facility];  Service: General;  Laterality: N/A;  . LAPAROSCOPY     pelvic   . MELANOMA EXCISION     Right leg   . TONSILLECTOMY      OB History    Gravida  2   Para  0   Term      Preterm      AB  2   Living        SAB  1   TAB  1   Ectopic     Multiple      Live Births               Home Medications    Prior to Admission medications   Medication Sig Start Date End Date Taking? Authorizing Provider  albuterol (VENTOLIN HFA) 108 (90 Base) MCG/ACT inhaler Inhale 2 puffs into the lungs every 4 (four) hours as needed for wheezing. 04/19/19  Yes Marylene Land, NP  ALPRAZolam Duanne Moron) 1 MG tablet  10/07/17  Yes [provider]  hydrOXYzine (ATARAX/VISTARIL) 25 MG tablet Take 1 tablet (25 mg total) every 6 (six) hours by mouth. 10/05/17  Yes Avie Echevaria B, PA-C  norethindrone-ethinyl estradiol (MICROGESTIN,JUNEL,LOESTRIN) 1-20 MG-MCG tablet  06/01/18  Yes [provider]  ELMIRON 100 MG capsule Take 2 tablets by mouth at bedtime.  02/18/17   [provider]  metroNIDAZOLE (FLAGYL) 500 MG tablet Take 1 tablet (500 mg total) by mouth 2 (two) times daily for 7 days. 02/19/20 02/26/20  Norval Gable, MD  ondansetron (ZOFRAN ODT) 8 MG disintegrating tablet Take 1 tablet (8 mg total) by mouth every 8 (eight)  hours as needed. 02/19/20   Norval Gable, MD  ondansetron (ZOFRAN) 4 MG tablet Take 1 tablet (4 mg total) by mouth every 8 (eight) hours as needed for nausea or vomiting. 01/17/20   Coral Spikes, DO    Family History Family History  Problem Relation Age of Onset  . Cancer Maternal Grandmother        breast  . Diabetes Mother   . Hypertension Mother   . Neuropathy Mother        diabetic  . Depression Mother   . Asthma Father     Social History Social History   Tobacco Use  . Smoking status: Former Smoker    Quit date: 02/04/2012    Years since quitting: 8.0  . Smokeless tobacco: Never Used  Substance Use Topics  . Alcohol use: Yes    Alcohol/week: 1.0 standard drinks    Types: 1 Standard drinks or equivalent per week    Comment: occasionally  . Drug use: No     Allergies   Cetirizine hcl and Vicodin [hydrocodone-acetaminophen]   Review of Systems Review of Systems   Gastrointestinal: Positive for diarrhea.     Physical Exam Triage Vital Signs ED Triage Vitals  Enc Vitals Group     BP 02/19/20 1859 113/71     Pulse Rate 02/19/20 1859 81     Resp 02/19/20 1859 18     Temp 02/19/20 1859 98.3 F (36.8 C)     Temp Source 02/19/20 1859 Oral     SpO2 02/19/20 1859 96 %     Weight 02/19/20 1856 170 lb (77.1 kg)     Height 02/19/20 1856 5\' 10"  (1.778 m)     Head Circumference --      Peak Flow --      Pain Score 02/19/20 1855 3     Pain Loc --      Pain Edu? --      Excl. in [place]? --    No data found.  Updated Vital Signs BP 113/71 (BP Location: Left Arm)   Pulse 81   Temp 98.3 F (36.8 C) (Oral)   Resp 18   Ht 5\' 10"  (1.778 m)   Wt 77.1 kg   SpO2 96%   BMI 24.39 kg/m   Visual Acuity Right Eye Distance:   Left Eye Distance:   Bilateral Distance:    Right Eye Near:   Left Eye Near:    Bilateral Near:     Physical Exam Vitals and nursing note reviewed.  Constitutional:      General: She is not in acute distress.    Appearance: She is not toxic-appearing or diaphoretic.  Cardiovascular:     Rate and Rhythm: Normal rate.  Pulmonary:     Effort: Pulmonary effort is normal. No respiratory distress.  Abdominal:     General: Bowel sounds are normal. There is no distension.     Palpations: Abdomen is soft. There is no mass.     Tenderness: There is no abdominal tenderness. There is no right CVA tenderness, left CVA tenderness, guarding or rebound.     Hernia: No hernia is present.  Neurological:     Mental Status: She is alert.      UC Treatments / Results  Labs (all labs ordered are listed, but only abnormal results are displayed) Labs Reviewed  WET PREP, GENITAL - Abnormal; Notable for the following components:      Result Value   Clue Cells Wet Prep  HPF POC PRESENT (*)    WBC, Wet Prep HPF POC MODERATE (*)    All other components within normal limits    EKG   Radiology No results found.  Procedures Procedures  (including critical care time)  Medications Ordered in UC Medications - No data to display  Initial Impression / Assessment and Plan / UC Course  I have reviewed the triage vital signs and the nursing notes.  Pertinent labs & imaging results that were available during my care of the patient were reviewed by me and considered in my medical decision making (see chart for details).      Final Clinical Impressions(s) / UC Diagnoses   Final diagnoses:  BV (bacterial vaginosis)  Gastroenteritis     Discharge Instructions     Rest, fluids, over the counter Imodium AD    ED Prescriptions    Medication Sig Dispense Auth. Provider   metroNIDAZOLE (FLAGYL) 500 MG tablet Take 1 tablet (500 mg total) by mouth 2 (two) times daily for 7 days. 14 tablet Shaneen Reeser, Linward Foster, MD   ondansetron (ZOFRAN ODT) 8 MG disintegrating tablet Take 1 tablet (8 mg total) by mouth every 8 (eight) hours as needed. 6 tablet Norval Gable, MD      1. Lab results and diagnosis reviewed with patient 2. rx as per orders above; reviewed possible side effects, interactions, risks and benefits  3. Recommend supportive treatment with rest, fluids, Imodium AD prn 4. Follow-up prn if symptoms worsen or don't improve  PDMP not reviewed this encounter.   Norval Gable, MD 02/19/20 [phone]

## 2020-02-19 NOTE — Discharge Instructions (Signed)
Rest, fluids, over the counter Imodium AD

## 2020-02-19 NOTE — ED Triage Notes (Signed)
Pt c/o vaginal itching, burning and pain. Started about a week ago. 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 She has also been having diarrhea and nasuea for the last 4-5 days.

## 2020-07-03 ENCOUNTER — Ambulatory Visit
Admission: EM | Admit: 2020-07-03 | Discharge: 2020-07-03 | Disposition: A | Discharge status: Home / Self Care | Payer: BLUE CROSS/BLUE SHIELD | Attending: Emergency Medicine | Admitting: Emergency Medicine

## 2020-07-03 ENCOUNTER — Other Ambulatory Visit: Payer: Self-pay

## 2020-07-03 DIAGNOSIS — H60333 Swimmer's ear, bilateral: Secondary | ICD-10-CM | POA: Diagnosis not present

## 2020-07-03 DIAGNOSIS — H9203 Otalgia, bilateral: Secondary | ICD-10-CM | POA: Diagnosis present

## 2020-07-03 DIAGNOSIS — H8110 Benign paroxysmal vertigo, unspecified ear: Secondary | ICD-10-CM | POA: Diagnosis not present

## 2020-07-03 MED ORDER — NEOMYCIN-POLYMYXIN-HC 3.5-10000-1 OT SUSP: 4.0000 [drp] | Freq: Three times a day (TID) | OTIC | 0 refills | Status: AC

## 2020-07-03 MED ORDER — MECLIZINE HCL 25 MG PO TABS: 25.0000 mg | ORAL_TABLET | Freq: Three times a day (TID) | ORAL | 0 refills | Status: DC | PRN

## 2020-07-03 NOTE — Discharge Instructions (Signed)
Take meds as directed. Follow up with ENT if symptoms persist

## 2020-07-03 NOTE — ED Provider Notes (Signed)
[facility]    CSN: [id number] Arrival date & time: 07/03/20  1754      History   Chief Complaint Chief Complaint  Patient presents with  . Otalgia    HPI Pamela Calhoun is a 34 y.o. female.   34 yr old female presents to UC with cc of worsening bilateral ear pain x 1 month, also has vertigo. Recently went swimming. Denies smoking,drinking,drugs,no treatment tried.   The history is provided by the patient. No language interpreter was used.    Past Medical History:  Diagnosis Date  . Allergy   . Cancer ([place])    skin- hystory of melanoma   . Depression   . Endometriosis   . Fracture    right foot  . Frequent UTI   . History of shingles   . Interstitial cystitis   . Pap smear, abnormal    cryosx   . PID (acute pelvic inflammatory disease)    hx of  . Ureteral reflux    as a child     Patient Active Problem List   Diagnosis Date Noted  . Acute swimmer's ear of both sides 07/03/2020  . Otalgia of both ears 07/03/2020  . Benign paroxysmal positional vertigo 07/03/2020  . BV (bacterial vaginosis) 07/28/2018  . Cough 04/05/2018  . Routine general medical examination at a health care facility 03/24/2018  . Hydradenitis 10/26/2017  . Screen for STD (sexually transmitted disease) 01/23/2015  . MRSA (methicillin resistant Staphylococcus aureus) carrier 04/12/2014  . INTERSTITIAL CYSTITIS 06/25/2010  . Allergic rhinitis 03/22/2007  . SKIN CANCER, HX OF 03/22/2007    Past Surgical History:  Procedure Laterality Date  . DILATION AND CURETTAGE OF UTERUS    . EXCISION OF SKIN TAG N/A 02/11/2016   Procedure: EXCISION OF ANAL SKIN TAG AN INTERNAL PAPILLA ;  Surgeon: Leighton Ruff, MD;  Location: [facility];  Service: General;  Laterality: N/A;  . LAPAROSCOPY     pelvic   . MELANOMA EXCISION     Right leg   . TONSILLECTOMY      OB History    Gravida  2   Para  0   Term      Preterm      AB  2   Living        SAB  1   TAB   1   Ectopic      Multiple      Live Births               Home Medications    Prior to Admission medications   Medication Sig Start Date End Date Taking? Authorizing Provider  albuterol (VENTOLIN HFA) 108 (90 Base) MCG/ACT inhaler Inhale 2 puffs into the lungs every 4 (four) hours as needed for wheezing. 04/19/19   Marylene Land, NP  ALPRAZolam Duanne Moron) 1 MG tablet  10/07/17   [provider]  ELMIRON 100 MG capsule Take 2 tablets by mouth at bedtime.  02/18/17   [provider]  hydrOXYzine (ATARAX/VISTARIL) 25 MG tablet Take 1 tablet (25 mg total) every 6 (six) hours by mouth. 10/05/17   Emeline General, PA-C  meclizine (ANTIVERT) 25 MG tablet Take 1 tablet (25 mg total) by mouth 3 (three) times daily as needed for dizziness. 03/01/80   Thijs Brunton, Jeanett Schlein, NP  neomycin-polymyxin-hydrocortisone (CORTISPORIN) 3.5-10000-1 OTIC suspension Place 4 drops into both ears 3 (three) times daily for 7 days. 1/91/47 07/22/55  Sasan Wilkie, Jeanett Schlein, NP  norethindrone-ethinyl estradiol (  MICROGESTIN,JUNEL,LOESTRIN) 1-20 MG-MCG tablet  06/01/18   [provider]  ondansetron (ZOFRAN ODT) 8 MG disintegrating tablet Take 1 tablet (8 mg total) by mouth every 8 (eight) hours as needed. 02/19/20   Norval Gable, MD  ondansetron (ZOFRAN) 4 MG tablet Take 1 tablet (4 mg total) by mouth every 8 (eight) hours as needed for nausea or vomiting. 01/17/20   Coral Spikes, DO    Family History Family History  Problem Relation Age of Onset  . Cancer Maternal Grandmother        breast  . Diabetes Mother   . Hypertension Mother   . Neuropathy Mother        diabetic  . Depression Mother   . Asthma Father     Social History Social History   Tobacco Use  . Smoking status: Former Smoker    Quit date: 02/04/2012    Years since quitting: 8.4  . Smokeless tobacco: Never Used  Vaping Use  . Vaping Use: Never used  Substance Use Topics  . Alcohol use: Yes    Alcohol/week: 1.0  standard drink    Types: 1 Standard drinks or equivalent per week    Comment: occasionally  . Drug use: No     Allergies   Cetirizine hcl and Vicodin [hydrocodone-acetaminophen]   Review of Systems Review of Systems  Constitutional: Negative for chills and fever.  HENT: Positive for congestion and ear pain. Negative for ear discharge and rhinorrhea.   Eyes: Negative.   Respiratory: Negative for cough.   Gastrointestinal: Negative for nausea and vomiting.  Endocrine: Negative.   Genitourinary: Negative for dysuria.  Musculoskeletal: Negative for myalgias.  Skin: Negative for rash.  Allergic/Immunologic: Negative.   Neurological: Positive for dizziness. Negative for headaches.  Hematological: Negative.   Psychiatric/Behavioral: Negative.   All other systems reviewed and are negative.    Physical Exam Triage Vital Signs ED Triage Vitals  Enc Vitals Group     BP 07/03/20 1810 125/85     Pulse Rate 07/03/20 1810 76     Resp 07/03/20 1810 16     Temp 07/03/20 1810 98.4 F (36.9 C)     Temp Source 07/03/20 1810 Oral     SpO2 --      Weight 07/03/20 1810 170 lb (77.1 kg)     Height 07/03/20 1810 5\' 10"  (1.778 m)     Head Circumference --      Peak Flow --      Pain Score 07/03/20 1809 6     Pain Loc --      Pain Edu? --      Excl. in [place]? --    No data found.  Updated Vital Signs BP 125/85 (BP Location: Left Arm)   Pulse 76   Temp 98.4 F (36.9 C) (Oral)   Resp 16   Ht 5\' 10"  (1.778 m)   Wt 170 lb (77.1 kg)   BMI 24.39 kg/m   Visual Acuity Right Eye Distance:   Left Eye Distance:   Bilateral Distance:    Right Eye Near:   Left Eye Near:    Bilateral Near:     Physical Exam Vitals and nursing note reviewed.  Constitutional:      General: She is not in acute distress.    Appearance: She is well-developed.  HENT:     Head: Normocephalic.     Right Ear: Hearing and ear canal normal. Swelling and tenderness present. Tympanic membrane is retracted.  Left Ear: Hearing and ear canal normal. Swelling and tenderness present. Tympanic membrane is retracted.  Eyes:     General: Lids are normal.     Conjunctiva/sclera: Conjunctivae normal.     Pupils: Pupils are equal, round, and reactive to light.  Neck:     Trachea: No tracheal deviation.  Cardiovascular:     Rate and Rhythm: Regular rhythm.     Pulses: Normal pulses.     Heart sounds: Normal heart sounds. No murmur heard.   Pulmonary:     Effort: Pulmonary effort is normal.     Breath sounds: Normal breath sounds.  Abdominal:     General: Bowel sounds are normal.     Palpations: Abdomen is soft.     Tenderness: There is no abdominal tenderness.  Musculoskeletal:        General: Normal range of motion.     Cervical back: Normal range of motion.  Lymphadenopathy:     Cervical: No cervical adenopathy.  Skin:    General: Skin is warm and dry.     Findings: No rash.  Neurological:     General: No focal deficit present.     Mental Status: She is alert and oriented to person, place, and time.     GCS: GCS eye subscore is 4. GCS verbal subscore is 5. GCS motor subscore is 6.     Cranial Nerves: Cranial nerves are intact.     Sensory: Sensation is intact.     Motor: Motor function is intact.     Gait: Gait is intact.     Comments: No nystagmus  Psychiatric:        Speech: Speech normal.        Behavior: Behavior normal. Behavior is cooperative.      UC Treatments / Results  Labs (all labs ordered are listed, but only abnormal results are displayed) Labs Reviewed - No data to display  EKG   Radiology No results found.  Procedures Procedures (including critical care time)  Medications Ordered in UC Medications - No data to display  Initial Impression / Assessment and Plan / UC Course  I have reviewed the triage vital signs and the nursing notes.  Pertinent labs & imaging results that were available during my care of the patient were reviewed by me and considered  in my medical decision making (see chart for details).      Final Clinical Impressions(s) / UC Diagnoses   Final diagnoses:  Acute swimmer's ear of both sides  Otalgia of both ears  Benign paroxysmal positional vertigo, unspecified laterality     Discharge Instructions     Take meds as directed. Follow up with ENT if symptoms persist    ED Prescriptions    Medication Sig Dispense Auth. Provider   neomycin-polymyxin-hydrocortisone (CORTISPORIN) 3.5-10000-1 OTIC suspension Place 4 drops into both ears 3 (three) times daily for 7 days. 10 mL Angeliz Settlemyre, Jeanett Schlein, NP   meclizine (ANTIVERT) 25 MG tablet Take 1 tablet (25 mg total) by mouth 3 (three) times daily as needed for dizziness. 30 tablet Sakai Heinle, Jeanett Schlein, NP     PDMP not reviewed this encounter.   Tori Milks, NP 60/67/70 2008

## 2020-07-03 NOTE — ED Triage Notes (Signed)
Bilateral ear pain x one month. Vertigo and worse when lying down, turning on her side or bending over.

## 2020-08-06 ENCOUNTER — Ambulatory Visit: Payer: Self-pay

## 2020-08-13 ENCOUNTER — Other Ambulatory Visit: Payer: Self-pay

## 2020-08-13 ENCOUNTER — Ambulatory Visit
Admission: EM | Admit: 2020-08-13 | Discharge: 2020-08-13 | Disposition: A | Discharge status: Home / Self Care | Payer: BLUE CROSS/BLUE SHIELD | Attending: Physician Assistant | Admitting: Physician Assistant

## 2020-08-13 DIAGNOSIS — B349 Viral infection, unspecified: Secondary | ICD-10-CM | POA: Insufficient documentation

## 2020-08-13 DIAGNOSIS — Z79899 Other long term (current) drug therapy: Secondary | ICD-10-CM | POA: Diagnosis not present

## 2020-08-13 DIAGNOSIS — R05 Cough: Secondary | ICD-10-CM | POA: Diagnosis not present

## 2020-08-13 DIAGNOSIS — Z87891 Personal history of nicotine dependence: Secondary | ICD-10-CM | POA: Diagnosis not present

## 2020-08-13 DIAGNOSIS — R112 Nausea with vomiting, unspecified: Secondary | ICD-10-CM | POA: Diagnosis not present

## 2020-08-13 DIAGNOSIS — Z793 Long term (current) use of hormonal contraceptives: Secondary | ICD-10-CM | POA: Diagnosis not present

## 2020-08-13 DIAGNOSIS — Z20822 Contact with and (suspected) exposure to covid-19: Secondary | ICD-10-CM | POA: Diagnosis not present

## 2020-08-13 DIAGNOSIS — F329 Major depressive disorder, single episode, unspecified: Secondary | ICD-10-CM | POA: Diagnosis not present

## 2020-08-13 DIAGNOSIS — R519 Headache, unspecified: Secondary | ICD-10-CM | POA: Diagnosis present

## 2020-08-13 DIAGNOSIS — N3010 Interstitial cystitis (chronic) without hematuria: Secondary | ICD-10-CM | POA: Insufficient documentation

## 2020-08-13 DIAGNOSIS — Z885 Allergy status to narcotic agent status: Secondary | ICD-10-CM | POA: Diagnosis not present

## 2020-08-13 DIAGNOSIS — R059 Cough, unspecified: Secondary | ICD-10-CM

## 2020-08-13 MED ORDER — ONDANSETRON 4 MG PO TBDP
4.0000 mg | ORAL_TABLET | Freq: Once | ORAL | Status: AC
  Administered 2020-08-13: 4 mg via ORAL

## 2020-08-13 MED ORDER — KETOROLAC TROMETHAMINE 60 MG/2ML IM SOLN
60.0000 mg | Freq: Once | INTRAMUSCULAR | Status: AC
  Administered 2020-08-13: 60 mg via INTRAMUSCULAR

## 2020-08-13 MED ORDER — ONDANSETRON 4 MG PO TBDP: 4.0000 mg | ORAL_TABLET | Freq: Three times a day (TID) | ORAL | 0 refills | Status: AC | PRN

## 2020-08-13 MED ORDER — KETOROLAC TROMETHAMINE 10 MG PO TABS: 10.0000 mg | ORAL_TABLET | Freq: Four times a day (QID) | ORAL | 0 refills | Status: AC | PRN

## 2020-08-13 MED ORDER — PSEUDOEPH-BROMPHEN-DM 30-2-10 MG/5ML PO SYRP: 10.0000 mL | ORAL_SOLUTION | Freq: Four times a day (QID) | ORAL | 0 refills | Status: AC | PRN

## 2020-08-13 NOTE — Discharge Instructions (Addendum)

## 2020-08-13 NOTE — ED Triage Notes (Signed)
Patient complains of headache, sinus pain and pressure, congestion, fever and chills since yesterday. States that her job told her to get Covid tested before she could return.

## 2020-08-13 NOTE — ED Provider Notes (Signed)
[facility]    CSN: [id number] Arrival date & time: 08/13/20  1748      History   Chief Complaint Chief Complaint  Patient presents with  . Headache    HPI Pamela Calhoun is a 34 y.o. female.   Patient presents for onset of headache, sinus pain and pressure, congestion, cough, fever and chills yesterday.  Patient is on her head is really hurting and she has tried Tylenol and Motrin without relief.  She also admits to some associated nausea.  She denies chest pain, breathing difficulty.  Denies any vomiting or diarrhea.  She denies known Covid exposure.  She has been vaccinated for Covid.  She says that her employer wants her to have a Covid test.  No other concerns today.  HPI  Past Medical History:  Diagnosis Date  . Allergy   . Cancer ([place])    skin- hystory of melanoma   . Depression   . Endometriosis   . Fracture    right foot  . Frequent UTI   . History of shingles   . Interstitial cystitis   . Pap smear, abnormal    cryosx   . PID (acute pelvic inflammatory disease)    hx of  . Ureteral reflux    as a child     Patient Active Problem List   Diagnosis Date Noted  . Acute swimmer's ear of both sides 07/03/2020  . Otalgia of both ears 07/03/2020  . Benign paroxysmal positional vertigo 07/03/2020  . BV (bacterial vaginosis) 07/28/2018  . Cough 04/05/2018  . Routine general medical examination at a health care facility 03/24/2018  . Hydradenitis 10/26/2017  . Screen for STD (sexually transmitted disease) 01/23/2015  . MRSA (methicillin resistant Staphylococcus aureus) carrier 04/12/2014  . INTERSTITIAL CYSTITIS 06/25/2010  . Allergic rhinitis 03/22/2007  . SKIN CANCER, HX OF 03/22/2007    Past Surgical History:  Procedure Laterality Date  . DILATION AND CURETTAGE OF UTERUS    . EXCISION OF SKIN TAG N/A 02/11/2016   Procedure: EXCISION OF ANAL SKIN TAG AN INTERNAL PAPILLA ;  Surgeon: Leighton Ruff, MD;  Location: [facility];   Service: General;  Laterality: N/A;  . LAPAROSCOPY     pelvic   . MELANOMA EXCISION     Right leg   . TONSILLECTOMY      OB History    Gravida  2   Para  0   Term      Preterm      AB  2   Living        SAB  1   TAB  1   Ectopic      Multiple      Live Births               Home Medications    Prior to Admission medications   Medication Sig Start Date End Date Taking? Authorizing Provider  albuterol (VENTOLIN HFA) 108 (90 Base) MCG/ACT inhaler Inhale 2 puffs into the lungs every 4 (four) hours as needed for wheezing. 04/19/19  Yes Marylene Land, NP  ALPRAZolam Duanne Moron) 1 MG tablet  10/07/17  Yes [provider]  ELMIRON 100 MG capsule Take 2 tablets by mouth at bedtime.  02/18/17  Yes [provider]  hydrOXYzine (ATARAX/VISTARIL) 25 MG tablet Take 1 tablet (25 mg total) every 6 (six) hours by mouth. 10/05/17  Yes Avie Echevaria B, PA-C  meclizine (ANTIVERT) 25 MG tablet Take 1 tablet (25 mg  total) by mouth 3 (three) times daily as needed for dizziness. 05/14/28  Yes Defelice, Jeanett Schlein, NP  norethindrone-ethinyl estradiol (MICROGESTIN,JUNEL,LOESTRIN) 1-20 MG-MCG tablet  06/01/18  Yes [provider]  brompheniramine-pseudoephedrine-DM 30-2-10 MG/5ML syrup Take 10 mLs by mouth 4 (four) times daily as needed for up to 7 days. 08/13/20 08/20/20  Laurene Footman B, PA-C  ketorolac (TORADOL) 10 MG tablet Take 1 tablet (10 mg total) by mouth every 6 (six) hours as needed for up to 5 days. 08/13/20 08/18/20  Laurene Footman B, PA-C  ondansetron (ZOFRAN ODT) 4 MG disintegrating tablet Take 1 tablet (4 mg total) by mouth every 8 (eight) hours as needed for up to 5 days for nausea or vomiting. 08/13/20 08/18/20  Danton Clap, PA-C    Family History Family History  Problem Relation Age of Onset  . Cancer Maternal Grandmother        breast  . Diabetes Mother   . Hypertension Mother   . Neuropathy Mother        diabetic  . Depression Mother   .  Asthma Father     Social History Social History   Tobacco Use  . Smoking status: Former Smoker    Quit date: 02/04/2012    Years since quitting: 8.5  . Smokeless tobacco: Never Used  Vaping Use  . Vaping Use: Never used  Substance Use Topics  . Alcohol use: Yes    Alcohol/week: 1.0 standard drink    Types: 1 Standard drinks or equivalent per week    Comment: occasionally  . Drug use: No     Allergies   Cetirizine hcl and Vicodin [hydrocodone-acetaminophen]   Review of Systems Review of Systems  Constitutional: Positive for chills and fever. Negative for diaphoresis and fatigue.  HENT: Positive for congestion, rhinorrhea, sinus pressure and sinus pain. Negative for ear pain and sore throat.   Respiratory: Positive for cough. Negative for shortness of breath.   Gastrointestinal: Positive for nausea. Negative for abdominal pain and vomiting.  Musculoskeletal: Negative for arthralgias and myalgias.  Skin: Negative for rash.  Neurological: Positive for headaches. Negative for weakness.  Hematological: Negative for adenopathy.     Physical Exam Triage Vital Signs ED Triage Vitals  Enc Vitals Group     BP 08/13/20 1848 129/67     Pulse Rate 08/13/20 1848 75     Resp 08/13/20 1848 18     Temp 08/13/20 1848 98.2 F (36.8 C)     Temp Source 08/13/20 1848 Oral     SpO2 08/13/20 1848 100 %     Weight 08/13/20 1845 175 lb (79.4 kg)     Height 08/13/20 1845 5\' 10"  (1.778 m)     Head Circumference --      Peak Flow --      Pain Score 08/13/20 1845 6     Pain Loc --      Pain Edu? --      Excl. in [place]? --    No data found.  Updated Vital Signs BP 129/67 (BP Location: Left Arm)   Pulse 75   Temp 98.2 F (36.8 C) (Oral)   Resp 18   Ht 5\' 10"  (1.778 m)   Wt 175 lb (79.4 kg)   SpO2 100%   BMI 25.11 kg/m       Physical Exam Vitals and nursing note reviewed.  Constitutional:      General: She is not in acute distress.    Appearance: Normal appearance. She is not  ill-appearing or toxic-appearing.  HENT:     Head: Normocephalic and atraumatic.     Right Ear: Tympanic membrane, ear canal and external ear normal.     Left Ear: Tympanic membrane, ear canal and external ear normal.     Nose: Congestion and rhinorrhea present.     Mouth/Throat:     Mouth: Mucous membranes are moist.     Pharynx: Oropharynx is clear. No posterior oropharyngeal erythema.  Eyes:     General: No scleral icterus.       Right eye: No discharge.        Left eye: No discharge.     Conjunctiva/sclera: Conjunctivae normal.     Pupils: Pupils are equal, round, and reactive to light.  Cardiovascular:     Rate and Rhythm: Normal rate and regular rhythm.     Heart sounds: Normal heart sounds.  Pulmonary:     Effort: Pulmonary effort is normal. No respiratory distress.     Breath sounds: Normal breath sounds.  Musculoskeletal:     Cervical back: Neck supple.  Skin:    General: Skin is dry.  Neurological:     General: No focal deficit present.     Mental Status: She is alert and oriented to person, place, and time. Mental status is at baseline.     Motor: No weakness.     Gait: Gait normal.  Psychiatric:        Mood and Affect: Mood normal.        Behavior: Behavior normal.        Thought Content: Thought content normal.      UC Treatments / Results  Labs (all labs ordered are listed, but only abnormal results are displayed) Labs Reviewed  SARS CORONAVIRUS 2 (TAT 6-24 HRS)    EKG   Radiology No results found.  Procedures Procedures (including critical care time)  Medications Ordered in UC Medications  ketorolac (TORADOL) injection 60 mg (60 mg Intramuscular Given 08/13/20 1942)  ondansetron (ZOFRAN-ODT) disintegrating tablet 4 mg (4 mg Oral Given 08/13/20 1942)    Initial Impression / Assessment and Plan / UC Course  I have reviewed the triage vital signs and the nursing notes.  Pertinent labs & imaging results that were available during my care of the  patient were reviewed by me and considered in my medical decision making (see chart for details).   Patient given 60 mg IM ketorolac and 4 mg ODT Zofran for headache with nausea.  Covid testing obtained.  CDC guidelines, isolation protocol and ED precautions discussed if Covid positive.  Advised rest, increasing fluids, ketorolac oral as prescribed for headache with Tylenol.  I also sent Zofran for nausea.  Bromfed cough medication sent as well.  Follow-up with our clinic as needed for any new or worsening symptoms.  Final Clinical Impressions(s) / UC Diagnoses   Final diagnoses:  Viral illness  Acute nonintractable headache, unspecified headache type  Non-intractable vomiting with nausea, unspecified vomiting type  Cough     Discharge Instructions     URI/COLD SYMPTOMS: Your exam today is consistent with a viral illness. Antibiotics are not indicated at this time. Use medications as directed, including cough syrup, nasal saline, and decongestants. Your symptoms should improve over the next few days and resolve within 7-10 days. Increase rest and fluids. F/u if symptoms worsen or predominate such as sore throat, ear pain, productive cough, shortness of breath, or if you develop high fevers or worsening fatigue over the next several days.  You have received COVID testing today either for positive exposure, concerning symptoms that could be related to COVID infection, screening purposes, or re-testing after confirmed positive.  Your test obtained today checks for active viral infection in the last 1-2 weeks. If your test is negative now, you can still test positive later. So, if you do develop symptoms you should either get re-tested and/or isolate x 10 days. Please follow CDC guidelines.  While Rapid antigen tests come back in 15-20 minutes, send out PCR/molecular test results typically come back within 24 hours. In the mean time, if you are symptomatic, assume this could be a positive test and  treat/monitor yourself as if you do have COVID.   We will call with test results. Please download the MyChart app and set up a profile to access test results.   If symptomatic, go home and rest. Push fluids. Take Tylenol as needed for discomfort. Gargle warm salt water. Throat lozenges. Take Mucinex DM or Robitussin for cough. Humidifier in bedroom to ease coughing. Warm showers. Also review the COVID handout for more information.  COVID-19 INFECTION: The incubation period of COVID-19 is approximately 14 days after exposure, with most symptoms developing in roughly 4-5 days. Symptoms may range in severity from mild to critically severe. Roughly 80% of those infected will have mild symptoms. People of any age may become infected with COVID-19 and have the ability to transmit the virus. The most common symptoms include: fever, fatigue, cough, body aches, headaches, sore throat, nasal congestion, shortness of breath, nausea, vomiting, diarrhea, changes in smell and/or taste.    COURSE OF ILLNESS Some patients may begin with mild disease which can progress quickly into critical symptoms. If your symptoms are worsening please call ahead to the Emergency Department and proceed there for further treatment. Recovery time appears to be roughly 1-2 weeks for mild symptoms and 3-6 weeks for severe disease.   GO IMMEDIATELY TO ER FOR FEVER YOU ARE UNABLE TO GET DOWN WITH TYLENOL, BREATHING PROBLEMS, CHEST PAIN, FATIGUE, LETHARGY, INABILITY TO EAT OR DRINK, ETC  QUARANTINE AND ISOLATION: To help decrease the spread of COVID-19 please remain isolated if you have COVID infection or are highly suspected to have COVID infection. This means -stay home and isolate to one room in the home if you live with others. Do not share a bed or bathroom with others while ill, sanitize and wipe down all countertops and keep common areas clean and disinfected. You may discontinue isolation if you have a mild case and are asymptomatic  10 days after symptom onset as long as you have been fever free >24 hours without having to take Motrin or Tylenol. If your case is more severe (meaning you develop pneumonia or are admitted in the hospital), you may have to isolate longer.   If you have been in close contact (within 6 feet) of someone diagnosed with COVID 19, you are advised to quarantine in your home for 14 days as symptoms can develop anywhere from 2-14 days after exposure to the virus. If you develop symptoms, you  must isolate.  Most current guidelines for COVID after exposure -isolate 10 days if you ARE NOT tested for COVID as long as symptoms do not develop -isolate 7 days if you are tested and remain asymptomatic -You do not necessarily need to be tested for COVID if you have + exposure and        develop   symptoms. Just isolate at home x10 days from symptom onset  During this global pandemic, CDC advises to practice social distancing, try to stay at least 5ft away from others at all times. Wear a face covering. Wash and sanitize your hands regularly and avoid going anywhere that is not necessary.  KEEP IN MIND THAT THE COVID TEST IS NOT 100% ACCURATE AND YOU SHOULD STILL DO EVERYTHING TO PREVENT POTENTIAL SPREAD OF VIRUS TO OTHERS (WEAR MASK, WEAR GLOVES, [place] HANDS AND SANITIZE REGULARLY). IF INITIAL TEST IS NEGATIVE, THIS MAY NOT MEAN YOU ARE DEFINITELY NEGATIVE. MOST ACCURATE TESTING IS DONE 5-7 DAYS AFTER EXPOSURE.   It is not advised by CDC to get re-tested after receiving a positive COVID test since you can still test positive for weeks to months after you have already cleared the virus.   *If you have not been vaccinated for COVID, I strongly suggest you consider getting vaccinated as long as there are no contraindications.      ED Prescriptions    Medication Sig Dispense Auth. Provider   ketorolac (TORADOL) 10 MG tablet Take 1 tablet (10 mg total) by mouth every 6 (six) hours as needed for up to 5 days. 20 tablet  Laurene Footman B, PA-C   ondansetron (ZOFRAN ODT) 4 MG disintegrating tablet Take 1 tablet (4 mg total) by mouth every 8 (eight) hours as needed for up to 5 days for nausea or vomiting. 15 tablet Laurene Footman B, PA-C   brompheniramine-pseudoephedrine-DM 30-2-10 MG/5ML syrup Take 10 mLs by mouth 4 (four) times daily as needed for up to 7 days. 150 mL Danton Clap, PA-C     PDMP not reviewed this encounter.   Danton Clap, PA-C 08/15/20 1705

## 2020-08-14 LAB — SARS CORONAVIRUS 2 (TAT 6-24 HRS): SARS Coronavirus 2: NEGATIVE

## 2020-08-16 ENCOUNTER — Encounter: Payer: Self-pay | Admitting: Emergency Medicine

## 2020-08-16 ENCOUNTER — Ambulatory Visit
Admission: EM | Admit: 2020-08-16 | Discharge: 2020-08-16 | Disposition: A | Discharge status: Home / Self Care | Payer: BLUE CROSS/BLUE SHIELD | Attending: Physician Assistant | Admitting: Physician Assistant

## 2020-08-16 ENCOUNTER — Other Ambulatory Visit: Payer: Self-pay

## 2020-08-16 DIAGNOSIS — R059 Cough, unspecified: Secondary | ICD-10-CM

## 2020-08-16 DIAGNOSIS — R05 Cough: Secondary | ICD-10-CM | POA: Diagnosis not present

## 2020-08-16 DIAGNOSIS — J019 Acute sinusitis, unspecified: Secondary | ICD-10-CM | POA: Insufficient documentation

## 2020-08-16 DIAGNOSIS — R519 Headache, unspecified: Secondary | ICD-10-CM | POA: Diagnosis present

## 2020-08-16 LAB — GROUP A STREP BY PCR: Group A Strep by PCR: NOT DETECTED

## 2020-08-16 MED ORDER — AMOXICILLIN-POT CLAVULANATE 875-125 MG PO TABS: 1.0000 | ORAL_TABLET | Freq: Two times a day (BID) | ORAL | 0 refills | Status: AC

## 2020-08-16 MED ORDER — FLUTICASONE PROPIONATE 50 MCG/ACT NA SUSP: 1.0000 | Freq: Two times a day (BID) | NASAL | 0 refills | Status: DC

## 2020-08-16 NOTE — Discharge Instructions (Addendum)
We will treat for sinus infection at this time with Augmentin and Flonase.  Continue Bromfed, rest and increasing fluids.  Continue ketorolac for headaches, but do not take ibuprofen when you take this.  You can take Tylenol if you need to. You can continue Zofran for nausea.  Since you have have multiple sinus infections yearly you should follow-up with ENT.  Keep your appointment they have scheduled in a month or:  Please contact [place] [facility] specialist:  Address: [address] [place], [place] [address] Hours:Closes 5PM Phone: [phone]

## 2020-08-16 NOTE — ED Provider Notes (Signed)
[facility]    CSN: [id number] Arrival date & time: 08/16/20  1459      History   Chief Complaint Chief Complaint  Patient presents with  . Sinus Problem  . Cough    HPI Pamela Calhoun is a 34 y.o. female.   Patient returns for complaint of sinus congestion and pain that has been worsening over the past 3 days.  She also admits to frontal headaches, body aches, fatigue, temperatures up to 99 degrees, and cough.  She was seen by me in the clinic 3 days ago and had a negative Covid test.  She says she has been taking the ketorolac, Zofran, and Bromfed without relief of symptoms.  She is also been using over-the-counter nasal spray.  She says she has a history of sinus infections and has an appointment in a month to see an ENT specialist about frequent sinus infections.  She says she usually requires an antibiotic to resolve the infection when it gets this point.  Patient denies any breathing difficulty.  She has no other concerns today.  HPI  Past Medical History:  Diagnosis Date  . Allergy   . Cancer ([place])    skin- hystory of melanoma   . Depression   . Endometriosis   . Fracture    right foot  . Frequent UTI   . History of shingles   . Interstitial cystitis   . Pap smear, abnormal    cryosx   . PID (acute pelvic inflammatory disease)    hx of  . Ureteral reflux    as a child     Patient Active Problem List   Diagnosis Date Noted  . Acute swimmer's ear of both sides 07/03/2020  . Otalgia of both ears 07/03/2020  . Benign paroxysmal positional vertigo 07/03/2020  . BV (bacterial vaginosis) 07/28/2018  . Cough 04/05/2018  . Routine general medical examination at a health care facility 03/24/2018  . Hydradenitis 10/26/2017  . Screen for STD (sexually transmitted disease) 01/23/2015  . MRSA (methicillin resistant Staphylococcus aureus) carrier 04/12/2014  . INTERSTITIAL CYSTITIS 06/25/2010  . Allergic rhinitis 03/22/2007  . SKIN CANCER, HX OF 03/22/2007     Past Surgical History:  Procedure Laterality Date  . DILATION AND CURETTAGE OF UTERUS    . EXCISION OF SKIN TAG N/A 02/11/2016   Procedure: EXCISION OF ANAL SKIN TAG AN INTERNAL PAPILLA ;  Surgeon: Leighton Ruff, MD;  Location: [facility];  Service: General;  Laterality: N/A;  . LAPAROSCOPY     pelvic   . MELANOMA EXCISION     Right leg   . TONSILLECTOMY      OB History    Gravida  2   Para  0   Term      Preterm      AB  2   Living        SAB  1   TAB  1   Ectopic      Multiple      Live Births               Home Medications    Prior to Admission medications   Medication Sig Start Date End Date Taking? Authorizing Provider  albuterol (VENTOLIN HFA) 108 (90 Base) MCG/ACT inhaler Inhale 2 puffs into the lungs every 4 (four) hours as needed for wheezing. 04/19/19   Marylene Land, NP  ALPRAZolam Duanne Moron) 1 MG tablet  10/07/17   [provider]  amoxicillin-clavulanate (AUGMENTIN)  875-125 MG tablet Take 1 tablet by mouth every 12 (twelve) hours for 7 days. 08/16/20 08/23/20  Laurene Footman B, PA-C  brompheniramine-pseudoephedrine-DM 30-2-10 MG/5ML syrup Take 10 mLs by mouth 4 (four) times daily as needed for up to 7 days. 08/13/20 08/20/20  Laurene Footman B, PA-C  ELMIRON 100 MG capsule Take 2 tablets by mouth at bedtime.  02/18/17   [provider]  fluticasone (FLONASE) 50 MCG/ACT nasal spray Place 1 spray into both nostrils in the morning and at bedtime for 7 days. 08/16/20 08/23/20  Laurene Footman B, PA-C  hydrOXYzine (ATARAX/VISTARIL) 25 MG tablet Take 1 tablet (25 mg total) every 6 (six) hours by mouth. 10/05/17   Avie Echevaria B, PA-C  ketorolac (TORADOL) 10 MG tablet Take 1 tablet (10 mg total) by mouth every 6 (six) hours as needed for up to 5 days. 08/13/20 08/18/20  Danton Clap, PA-C  meclizine (ANTIVERT) 25 MG tablet Take 1 tablet (25 mg total) by mouth 3 (three) times daily as needed for dizziness. 3/81/01   Defelice,  Jeanett Schlein, NP  norethindrone-ethinyl estradiol (MICROGESTIN,JUNEL,LOESTRIN) 1-20 MG-MCG tablet  06/01/18   [provider]  ondansetron (ZOFRAN ODT) 4 MG disintegrating tablet Take 1 tablet (4 mg total) by mouth every 8 (eight) hours as needed for up to 5 days for nausea or vomiting. 08/13/20 08/18/20  Danton Clap, PA-C    Family History Family History  Problem Relation Age of Onset  . Cancer Maternal Grandmother        breast  . Diabetes Mother   . Hypertension Mother   . Neuropathy Mother        diabetic  . Depression Mother   . Asthma Father     Social History Social History   Tobacco Use  . Smoking status: Former Smoker    Quit date: 02/04/2012    Years since quitting: 8.5  . Smokeless tobacco: Never Used  Vaping Use  . Vaping Use: Never used  Substance Use Topics  . Alcohol use: Yes    Alcohol/week: 1.0 standard drink    Types: 1 Standard drinks or equivalent per week    Comment: occasionally  . Drug use: No     Allergies   Cetirizine hcl and Vicodin [hydrocodone-acetaminophen]   Review of Systems Review of Systems  Constitutional: Positive for fatigue and fever. Negative for chills and diaphoresis.  HENT: Positive for congestion, ear pain, rhinorrhea, sinus pressure, sinus pain and sore throat.   Respiratory: Positive for cough. Negative for shortness of breath.   Gastrointestinal: Positive for nausea. Negative for abdominal pain and vomiting.  Musculoskeletal: Negative for arthralgias and myalgias.  Skin: Negative for rash.  Neurological: Positive for headaches. Negative for weakness.  Hematological: Negative for adenopathy.     Physical Exam Triage Vital Signs ED Triage Vitals  Enc Vitals Group     BP 08/16/20 1511 (!) 143/81     Pulse Rate 08/16/20 1511 76     Resp 08/16/20 1511 14     Temp 08/16/20 1511 98.3 F (36.8 C)     Temp Source 08/16/20 1511 Oral     SpO2 08/16/20 1511 97 %     Weight 08/16/20 1508 175 lb (79.4 kg)     Height  08/16/20 1508 5\' 10"  (1.778 m)     Head Circumference --      Peak Flow --      Pain Score 08/16/20 1508 6     Pain Loc --  Pain Edu? --      Excl. in [place]? --    No data found.  Updated Vital Signs BP (!) 143/81 (BP Location: Right Arm)   Pulse 76   Temp 98.3 F (36.8 C) (Oral)   Resp 14   Ht 5\' 10"  (1.778 m)   Wt 175 lb (79.4 kg)   SpO2 97%   BMI 25.11 kg/m      Physical Exam Vitals and nursing note reviewed.  Constitutional:      General: She is not in acute distress.    Appearance: Normal appearance. She is not ill-appearing or toxic-appearing.  HENT:     Head: Normocephalic and atraumatic.     Right Ear: A middle ear effusion is present.     Left Ear: A middle ear effusion is present.     Nose: Septal deviation, mucosal edema, congestion and rhinorrhea present.     Right Sinus: Maxillary sinus tenderness present.     Left Sinus: Maxillary sinus tenderness present.     Mouth/Throat:     Mouth: Mucous membranes are moist.     Pharynx: Oropharynx is clear. Posterior oropharyngeal erythema present.  Eyes:     General: No scleral icterus.       Right eye: No discharge.        Left eye: No discharge.     Conjunctiva/sclera: Conjunctivae normal.  Cardiovascular:     Rate and Rhythm: Normal rate and regular rhythm.     Heart sounds: Normal heart sounds.  Pulmonary:     Effort: Pulmonary effort is normal. No respiratory distress.     Breath sounds: Normal breath sounds.  Musculoskeletal:     Cervical back: Neck supple.  Skin:    General: Skin is dry.  Neurological:     General: No focal deficit present.     Mental Status: She is alert. Mental status is at baseline.     Motor: No weakness.     Gait: Gait normal.  Psychiatric:        Mood and Affect: Mood normal.        Behavior: Behavior normal.        Thought Content: Thought content normal.      UC Treatments / Results  Labs (all labs ordered are listed, but only abnormal results are  displayed) Labs Reviewed  GROUP A STREP BY PCR    EKG   Radiology No results found.  Procedures Procedures (including critical care time)  Medications Ordered in UC Medications - No data to display  Initial Impression / Assessment and Plan / UC Course  I have reviewed the triage vital signs and the nursing notes.  Pertinent labs & imaging results that were available during my care of the patient were reviewed by me and considered in my medical decision making (see chart for details).    Final Clinical Impressions(s) / UC Diagnoses   Final diagnoses:  Acute sinusitis, recurrence not specified, unspecified location  Acute nonintractable headache, unspecified headache type  Cough     Discharge Instructions     We will treat for sinus infection at this time with Augmentin and Flonase.  Continue Bromfed, rest and increasing fluids.  Continue ketorolac for headaches, but do not take ibuprofen when you take this.  You can take Tylenol if you need to. You can continue Zofran for nausea.  Since you have have multiple sinus infections yearly you should follow-up with ENT.  Keep your appointment they have scheduled in a  month or:  Please contact [facility] [facility] specialist:  Address: [address] [place], [place] [address] Hours:Closes 5PM Phone: [phone]    ED Prescriptions    Medication Sig Dispense Auth. Provider   amoxicillin-clavulanate (AUGMENTIN) 875-125 MG tablet Take 1 tablet by mouth every 12 (twelve) hours for 7 days. 14 tablet Laurene Footman B, PA-C   fluticasone (FLONASE) 50 MCG/ACT nasal spray Place 1 spray into both nostrils in the morning and at bedtime for 7 days. 1 g Danton Clap, PA-C     PDMP not reviewed this encounter.   Danton Clap, PA-C 08/16/20 1554

## 2020-08-16 NOTE — ED Triage Notes (Signed)
Patient c/o cough, sinus congestion and pressure, stuffy nose, headaches and bodyaches for a week.  Patient reports low grade fevers.  Patient states that she was seen on Tuesday here and her COVID test was negative.

## 2020-09-03 ENCOUNTER — Other Ambulatory Visit: Payer: Self-pay

## 2020-09-03 ENCOUNTER — Ambulatory Visit
Admission: EM | Admit: 2020-09-03 | Discharge: 2020-09-03 | Disposition: A | Discharge status: Home / Self Care | Payer: BLUE CROSS/BLUE SHIELD | Attending: Family Medicine | Admitting: Family Medicine

## 2020-09-03 ENCOUNTER — Other Ambulatory Visit: Payer: BLUE CROSS/BLUE SHIELD

## 2020-09-03 ENCOUNTER — Encounter: Payer: Self-pay | Admitting: Emergency Medicine

## 2020-09-03 ENCOUNTER — Ambulatory Visit: Payer: Self-pay

## 2020-09-03 ENCOUNTER — Ambulatory Visit
Admission: EM | Admit: 2020-09-03 | Discharge: 2020-09-03 | Discharge status: Against Medical Advice | Payer: BLUE CROSS/BLUE SHIELD

## 2020-09-03 DIAGNOSIS — J988 Other specified respiratory disorders: Secondary | ICD-10-CM | POA: Insufficient documentation

## 2020-09-03 DIAGNOSIS — Z20822 Contact with and (suspected) exposure to covid-19: Secondary | ICD-10-CM | POA: Insufficient documentation

## 2020-09-03 MED ORDER — BENZONATATE 200 MG PO CAPS: 200.0000 mg | ORAL_CAPSULE | Freq: Three times a day (TID) | ORAL | 0 refills | Status: DC | PRN

## 2020-09-03 MED ORDER — KETOROLAC TROMETHAMINE 10 MG PO TABS: 10.0000 mg | ORAL_TABLET | Freq: Four times a day (QID) | ORAL | 0 refills | Status: DC | PRN

## 2020-09-03 NOTE — ED Provider Notes (Signed)
[facility]    CSN: [id number] Arrival date & time: 09/03/20  1803  History   Chief Complaint Chief Complaint  Patient presents with  . Cough  . Headache  . Generalized Body Aches   HPI  34 year old female presents with the above complaints.  Patient states she has had cough and congestion for the past few days. Today developed fever. She had some associated body aches. Fever has been as high as 100.4. She has taken ibuprofen with improvement in fever. Patient states that she feels like she has a cold but was encouraged to come in for evaluation by her workplace. 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 No other associated symptoms. No other complaints.  Past Medical History:  Diagnosis Date  . Allergy   . Cancer ([place])    skin- hystory of melanoma   . Depression   . Endometriosis   . Fracture    right foot  . Frequent UTI   . History of shingles   . Interstitial cystitis   . Pap smear, abnormal    cryosx   . PID (acute pelvic inflammatory disease)    hx of  . Ureteral reflux    as a child     Patient Active Problem List   Diagnosis Date Noted  . Acute swimmer's ear of both sides 07/03/2020  . Otalgia of both ears 07/03/2020  . Benign paroxysmal positional vertigo 07/03/2020  . BV (bacterial vaginosis) 07/28/2018  . Cough 04/05/2018  . Routine general medical examination at a health care facility 03/24/2018  . Hydradenitis 10/26/2017  . Screen for STD (sexually transmitted disease) 01/23/2015  . MRSA (methicillin resistant Staphylococcus aureus) carrier 04/12/2014  . INTERSTITIAL CYSTITIS 06/25/2010  . Allergic rhinitis 03/22/2007  . SKIN CANCER, HX OF 03/22/2007    Past Surgical History:  Procedure Laterality Date  . DILATION AND CURETTAGE OF UTERUS    . EXCISION OF SKIN TAG N/A 02/11/2016   Procedure: EXCISION OF ANAL SKIN TAG AN INTERNAL PAPILLA ;  Surgeon: Leighton Ruff, MD;  Location: [facility];  Service: General;  Laterality: N/A;  . LAPAROSCOPY     pelvic   . MELANOMA EXCISION     Right leg   . TONSILLECTOMY      OB History    Gravida  2   Para  0   Term      Preterm      AB  2   Living        SAB  1   TAB  1   Ectopic      Multiple      Live Births               Home Medications    Prior to Admission medications   Medication Sig Start Date End Date Taking? Authorizing Provider  albuterol (VENTOLIN HFA) 108 (90 Base) MCG/ACT inhaler Inhale 2 puffs into the lungs every 4 (four) hours as needed for wheezing. 04/19/19  Yes Marylene Land, NP  ALPRAZolam Duanne Moron) 1 MG tablet  10/07/17  Yes [provider]  ELMIRON 100 MG capsule Take 2 tablets by mouth at bedtime.  02/18/17  Yes [provider]  fluticasone (FLONASE) 50 MCG/ACT nasal spray Place 1 spray into both nostrils in the morning and at bedtime for 7 days. 08/16/20 09/03/20 Yes Laurene Footman B, PA-C  hydrOXYzine (ATARAX/VISTARIL) 25 MG tablet Take 1 tablet (25 mg total)  every 6 (six) hours by mouth. 10/05/17  Yes Avie Echevaria B, PA-C  norethindrone-ethinyl estradiol (MICROGESTIN,JUNEL,LOESTRIN) 1-20 MG-MCG tablet  06/01/18  Yes [provider]  benzonatate (TESSALON) 200 MG capsule Take 1 capsule (200 mg total) by mouth 3 (three) times daily as needed for cough. 09/03/20   Coral Spikes, DO  ketorolac (TORADOL) 10 MG tablet Take 1 tablet (10 mg total) by mouth every 6 (six) hours as needed for moderate pain or severe pain. 09/03/20   Coral Spikes, DO    Family History Family History  Problem Relation Age of Onset  . Cancer Maternal Grandmother        breast  . Diabetes Mother   . Hypertension Mother   . Neuropathy Mother        diabetic  . Depression Mother   . Asthma Father     Social History Social History   Tobacco Use  . Smoking status: Former Smoker    Quit date: 02/04/2012    Years since quitting: 8.5  . Smokeless tobacco: Never Used    Vaping Use  . Vaping Use: Never used  Substance Use Topics  . Alcohol use: Yes    Alcohol/week: 1.0 standard drink    Types: 1 Standard drinks or equivalent per week    Comment: occasionally  . Drug use: No     Allergies   Cetirizine, Cetirizine hcl, Hydrocodone-acetaminophen, and Vicodin [hydrocodone-acetaminophen]   Review of Systems Review of Systems Per HPI  Physical Exam Triage Vital Signs ED Triage Vitals  Enc Vitals Group     BP 09/03/20 1834 119/89     Pulse Rate 09/03/20 1834 86     Resp 09/03/20 1834 18     Temp 09/03/20 1834 97.9 F (36.6 C)     Temp Source 09/03/20 1834 Oral     SpO2 09/03/20 1834 100 %     Weight 09/03/20 1834 170 lb (77.1 kg)     Height 09/03/20 1834 5\' 10"  (1.778 m)     Head Circumference --      Peak Flow --      Pain Score 09/03/20 1833 4     Pain Loc --      Pain Edu? --      Excl. in [place]? --    Updated Vital Signs BP 119/89 (BP Location: Left Arm)   Pulse 86   Temp 97.9 F (36.6 C) (Oral)   Resp 18   Ht 5\' 10"  (1.778 m)   Wt 77.1 kg   SpO2 100%   BMI 24.39 kg/m   Visual Acuity Right Eye Distance:   Left Eye Distance:   Bilateral Distance:    Right Eye Near:   Left Eye Near:    Bilateral Near:     Physical Exam Vitals and nursing note reviewed.  Constitutional:      General: She is not in acute distress.    Appearance: Normal appearance. She is not ill-appearing.  HENT:     Head: Normocephalic and atraumatic.  Eyes:     General:        Right eye: No discharge.        Left eye: No discharge.     Conjunctiva/sclera: Conjunctivae normal.  Cardiovascular:     Rate and Rhythm: Normal rate and regular rhythm.     Heart sounds: No murmur heard.   Pulmonary:     Effort: Pulmonary effort is normal.     Breath sounds: Normal breath sounds. No  wheezing, rhonchi or rales.  Neurological:     Mental Status: She is alert.  Psychiatric:        Mood and Affect: Mood normal.        Behavior: Behavior normal.    UC  Treatments / Results  Labs (all labs ordered are listed, but only abnormal results are displayed) Labs Reviewed  SARS CORONAVIRUS 2 (TAT 6-24 HRS)    EKG   Radiology No results found.  Procedures Procedures (including critical care time)  Medications Ordered in UC Medications - No data to display  Initial Impression / Assessment and Plan / UC Course  I have reviewed the triage vital signs and the nursing notes.  Pertinent labs & imaging results that were available during my care of the patient were reviewed by me and considered in my medical decision making (see chart for details).    34 year old female presents with a respiratory infection. Possible COVID-19. Treating symptomatically with Toradol and Tessalon Perles. Awaiting Covid test results.  Final Clinical Impressions(s) / UC Diagnoses   Final diagnoses:  Respiratory infection  Encounter for laboratory testing for COVID-19 virus     Discharge Instructions     Rest. Fluids.  Medication as prescribed.  Results available in 24 hours.  Stay home.  Take care  Dr. Lacinda Axon     ED Prescriptions    Medication Sig Dispense Auth. Provider   ketorolac (TORADOL) 10 MG tablet Take 1 tablet (10 mg total) by mouth every 6 (six) hours as needed for moderate pain or severe pain. 20 tablet Juletta Berhe G, DO   benzonatate (TESSALON) 200 MG capsule Take 1 capsule (200 mg total) by mouth 3 (three) times daily as needed for cough. 30 capsule Coral Spikes, DO     PDMP not reviewed this encounter.   Coral Spikes, [place] 09/03/20 2032

## 2020-09-03 NOTE — ED Triage Notes (Signed)
Patient in today c/o cough, headache and body aches x 3 days, but getting worse. Patient temp this morning was 100.4. Patient's last dose of Ibuprofen was ~4pm today. Patient has completed the covid vaccines.

## 2020-09-03 NOTE — Discharge Instructions (Signed)
Rest. Fluids.  Medication as prescribed.  Results available in 24 hours.  Stay home.  Take care  Dr. Lacinda Axon

## 2020-09-04 ENCOUNTER — Encounter: Payer: BLUE CROSS/BLUE SHIELD | Admitting: Family Medicine

## 2020-09-04 LAB — SARS-COV-2, NAA 2 DAY TAT

## 2020-09-04 LAB — NOVEL CORONAVIRUS, NAA: SARS-CoV-2, NAA: NOT DETECTED

## 2020-09-04 LAB — SARS CORONAVIRUS 2 (TAT 6-24 HRS): SARS Coronavirus 2: NEGATIVE

## 2020-09-10 ENCOUNTER — Other Ambulatory Visit: Payer: Self-pay

## 2020-09-10 ENCOUNTER — Ambulatory Visit
Admission: RE | Admit: 2020-09-10 | Discharge: 2020-09-10 | Disposition: A | Discharge status: Home / Self Care | Payer: BLUE CROSS/BLUE SHIELD | Source: Ambulatory Visit | Attending: Emergency Medicine | Admitting: Emergency Medicine

## 2020-09-10 VITALS — BP 114/80 | HR 84 | Temp 98.1°F | Resp 18 | Ht 70.0 in | Wt 175.0 lb

## 2020-09-10 DIAGNOSIS — A059 Bacterial foodborne intoxication, unspecified: Secondary | ICD-10-CM | POA: Diagnosis not present

## 2020-09-10 MED ORDER — ONDANSETRON 8 MG PO TBDP
8.0000 mg | ORAL_TABLET | Freq: Once | ORAL | Status: AC
  Administered 2020-09-10: 8 mg via ORAL

## 2020-09-10 MED ORDER — ONDANSETRON 8 MG PO TBDP: 8.0000 mg | ORAL_TABLET | Freq: Three times a day (TID) | ORAL | 0 refills | Status: DC | PRN

## 2020-09-10 MED ORDER — DICYCLOMINE HCL 20 MG PO TABS: 20.0000 mg | ORAL_TABLET | Freq: Two times a day (BID) | ORAL | 0 refills | Status: DC

## 2020-09-10 NOTE — ED Triage Notes (Signed)
Patient c/o vomiting and diarrhea that started this morning. She states she is concerned for food poisoning. Her friend that she ate with last night is vomiting.

## 2020-09-10 NOTE — ED Provider Notes (Signed)
[facility]    CSN: [id number] Arrival date & time: 09/10/20  1756      History   Chief Complaint Chief Complaint  Patient presents with  . Diarrhea  . Emesis    HPI Pamela Calhoun is a 34 y.o. female.   34 year old female presents for evaluation of vomiting and diarrhea.  Patient reports that her symptoms started abruptly at 4:00 this morning.  She states that her friend cooked pork chops and she did not think they smelled right.  Both she and her friend have the same symptoms now.  She reports 8 episodes of vomiting and too numerous to count episodes of diarrhea today.  She reports that she has not seen blood in either emesis or stool.  She is unsure if she had a fever but she has had sweats and chills.  She is has abdominal cramping.  In the last 2 hours she has been able to drink and keep down 2 bottles of Gatorade.     Past Medical History:  Diagnosis Date  . Allergy   . Cancer ([place])    skin- hystory of melanoma   . Depression   . Endometriosis   . Fracture    right foot  . Frequent UTI   . History of shingles   . Interstitial cystitis   . Pap smear, abnormal    cryosx   . PID (acute pelvic inflammatory disease)    hx of  . Ureteral reflux    as a child     Patient Active Problem List   Diagnosis Date Noted  . Acute swimmer's ear of both sides 07/03/2020  . Otalgia of both ears 07/03/2020  . Benign paroxysmal positional vertigo 07/03/2020  . BV (bacterial vaginosis) 07/28/2018  . Cough 04/05/2018  . Routine general medical examination at a health care facility 03/24/2018  . Hydradenitis 10/26/2017  . Screen for STD (sexually transmitted disease) 01/23/2015  . MRSA (methicillin resistant Staphylococcus aureus) carrier 04/12/2014  . INTERSTITIAL CYSTITIS 06/25/2010  . Allergic rhinitis 03/22/2007  . SKIN CANCER, HX OF 03/22/2007    Past Surgical History:  Procedure Laterality Date  . DILATION AND CURETTAGE OF UTERUS    . EXCISION OF  SKIN TAG N/A 02/11/2016   Procedure: EXCISION OF ANAL SKIN TAG AN INTERNAL PAPILLA ;  Surgeon: Leighton Ruff, MD;  Location: [facility];  Service: General;  Laterality: N/A;  . LAPAROSCOPY     pelvic   . MELANOMA EXCISION     Right leg   . TONSILLECTOMY      OB History    Gravida  2   Para  0   Term      Preterm      AB  2   Living        SAB  1   TAB  1   Ectopic      Multiple      Live Births               Home Medications    Prior to Admission medications   Medication Sig Start Date End Date Taking? Authorizing Provider  albuterol (VENTOLIN HFA) 108 (90 Base) MCG/ACT inhaler Inhale 2 puffs into the lungs every 4 (four) hours as needed for wheezing. 04/19/19  Yes Marylene Land, NP  ALPRAZolam Duanne Moron) 1 MG tablet  10/07/17  Yes [provider]  ELMIRON 100 MG capsule Take 2 tablets by mouth at bedtime.  02/18/17  Yes [provider]  fluticasone (FLONASE) 50 MCG/ACT nasal spray Place 1 spray into both nostrils in the morning and at bedtime for 7 days. 08/16/20 09/10/20 Yes Laurene Footman B, PA-C  hydrOXYzine (ATARAX/VISTARIL) 25 MG tablet Take 1 tablet (25 mg total) every 6 (six) hours by mouth. 10/05/17  Yes Avie Echevaria B, PA-C  norethindrone-ethinyl estradiol (MICROGESTIN,JUNEL,LOESTRIN) 1-20 MG-MCG tablet  06/01/18  Yes [provider]  dicyclomine (BENTYL) 20 MG tablet Take 1 tablet (20 mg total) by mouth 2 (two) times daily. 09/10/20   Margarette Canada, NP  ondansetron (ZOFRAN ODT) 8 MG disintegrating tablet Take 1 tablet (8 mg total) by mouth every 8 (eight) hours as needed for nausea or vomiting. 09/10/20   Margarette Canada, NP    Family History Family History  Problem Relation Age of Onset  . Cancer Maternal Grandmother        breast  . Diabetes Mother   . Hypertension Mother   . Neuropathy Mother        diabetic  . Depression Mother   . Asthma Father     Social History Social History   Tobacco Use  .  Smoking status: Former Smoker    Quit date: 02/04/2012    Years since quitting: 8.6  . Smokeless tobacco: Never Used  Vaping Use  . Vaping Use: Never used  Substance Use Topics  . Alcohol use: Yes    Alcohol/week: 1.0 standard drink    Types: 1 Standard drinks or equivalent per week    Comment: occasionally  . Drug use: No     Allergies   Cetirizine, Cetirizine hcl, Hydrocodone-acetaminophen, and Vicodin [hydrocodone-acetaminophen]   Review of Systems Review of Systems  Constitutional: Positive for chills and diaphoresis. Negative for activity change, appetite change and fever.  HENT: Negative for congestion, rhinorrhea and sore throat.   Respiratory: Negative for cough and shortness of breath.   Gastrointestinal: Positive for abdominal distention, diarrhea, nausea and vomiting. Negative for blood in stool.  Genitourinary: Negative for dysuria, frequency and urgency.  Musculoskeletal: Negative for arthralgias and myalgias.  Skin: Negative for rash.  Neurological: Negative for syncope and headaches.  Hematological: Negative.   Psychiatric/Behavioral: Negative.      Physical Exam Triage Vital Signs ED Triage Vitals  Enc Vitals Group     BP 09/10/20 1831 114/80     Pulse Rate 09/10/20 1831 84     Resp 09/10/20 1831 18     Temp 09/10/20 1831 98.1 F (36.7 C)     Temp Source 09/10/20 1831 Oral     SpO2 09/10/20 1831 100 %     Weight 09/10/20 1829 175 lb (79.4 kg)     Height 09/10/20 1829 5' 10"  (1.778 m)     Head Circumference --      Peak Flow --      Pain Score 09/10/20 1829 4     Pain Loc --      Pain Edu? --      Excl. in [place]? --    No data found.  Updated Vital Signs BP 114/80 (BP Location: Right Arm)   Pulse 84   Temp 98.1 F (36.7 C) (Oral)   Resp 18   Ht 5' 10"  (1.778 m)   Wt 175 lb (79.4 kg)   SpO2 100%   BMI 25.11 kg/m   Visual Acuity Right Eye Distance:   Left Eye Distance:   Bilateral Distance:    Right Eye Near:   Left Eye Near:  Bilateral Near:     Physical Exam Vitals and nursing note reviewed.  Constitutional:      General: She is not in acute distress.    Appearance: She is ill-appearing.     Comments: Patient looks like she does not feel well.  HENT:     Head: Normocephalic and atraumatic.  Eyes:     General: No scleral icterus.    Extraocular Movements: Extraocular movements intact.     Conjunctiva/sclera: Conjunctivae normal.     Pupils: Pupils are equal, round, and reactive to light.  Cardiovascular:     Rate and Rhythm: Normal rate and regular rhythm.     Pulses: Normal pulses.     Heart sounds: Normal heart sounds. No murmur heard.  No gallop.   Pulmonary:     Effort: Pulmonary effort is normal.     Breath sounds: Normal breath sounds. No wheezing, rhonchi or rales.  Abdominal:     General: There is no distension.     Palpations: Abdomen is soft. There is no mass.     Tenderness: There is abdominal tenderness. There is no guarding or rebound.     Comments: Bowel sounds are hyperactive in all 4 quadrants.  Patient has generalized tenderness without any focal tenderness on abdominal exam.  Musculoskeletal:        General: No tenderness or deformity. Normal range of motion.     Cervical back: Normal range of motion and neck supple.  Lymphadenopathy:     Cervical: No cervical adenopathy.  Skin:    General: Skin is warm and dry.     Capillary Refill: Capillary refill takes less than 2 seconds.     Coloration: Skin is not jaundiced.     Findings: No rash.  Neurological:     General: No focal deficit present.     Mental Status: She is alert and oriented to person, place, and time.  Psychiatric:        Mood and Affect: Mood normal.        Behavior: Behavior normal.        Thought Content: Thought content normal.        Judgment: Judgment normal.      UC Treatments / Results  Labs (all labs ordered are listed, but only abnormal results are displayed) Labs Reviewed - No data to  display  EKG   Radiology No results found.  Procedures Procedures (including critical care time)  Medications Ordered in UC Medications  ondansetron (ZOFRAN-ODT) disintegrating tablet 8 mg (8 mg Oral Given 09/10/20 1910)    Initial Impression / Assessment and Plan / UC Course  I have reviewed the triage vital signs and the nursing notes.  Pertinent labs & imaging results that were available during my care of the patient were reviewed by me and considered in my medical decision making (see chart for details).   Patient developed vomiting and diarrhea early this morning after eating some pork that her friend prepared that did not smell right to her.  She is had multiple episodes of emesis and diarrhea.  She denies seeing blood in either 1.  Abdominal exam is soft, generalized tenderness without focal tenderness.  Suspect foodborne illness.  Will order stool PCR and Zofran for nausea.  Patient is unable to provide stool in clinic.  Will send kit at home for her to return with.  Will DC home with Zofran for nausea and dicyclomine for spasms.  Final Clinical Impressions(s) / UC Diagnoses   Final  diagnoses:  Food poisoning     Discharge Instructions     Use the Zofran every 8 hours as needed for nausea.  Just place it on or under your tongue and that will dissolve.  Use the dicyclomine every 6 hours as needed for cramping in her abdomen.  To rehydrate using broth, ginger ale, herbal tea, and water.  When you are able to collect a stool sample bring it back so we can send it for analysis.  Most foodborne illness tends to be self-limiting and you should feel better in 24 hours.  If you develop a fever, increased abdominal pain, or noticed blood in your stool go to the ER for evaluation.    ED Prescriptions    Medication Sig Dispense Auth. Provider   ondansetron (ZOFRAN ODT) 8 MG disintegrating tablet Take 1 tablet (8 mg total) by mouth every 8 (eight) hours as needed for  nausea or vomiting. 20 tablet Margarette Canada, NP   dicyclomine (BENTYL) 20 MG tablet Take 1 tablet (20 mg total) by mouth 2 (two) times daily. 20 tablet Margarette Canada, NP     PDMP not reviewed this encounter.   Margarette Canada, NP 09/10/20 1921

## 2020-09-10 NOTE — Discharge Instructions (Signed)
Use the Zofran every 8 hours as needed for nausea.  Just place it on or under your tongue and that will dissolve.  Use the dicyclomine every 6 hours as needed for cramping in her abdomen.  To rehydrate using broth, ginger ale, herbal tea, and water.  When you are able to collect a stool sample bring it back so we can send it for analysis.  Most foodborne illness tends to be self-limiting and you should feel better in 24 hours.  If you develop a fever, increased abdominal pain, or noticed blood in your stool go to the ER for evaluation.

## 2020-09-23 ENCOUNTER — Other Ambulatory Visit: Payer: Self-pay

## 2020-09-23 ENCOUNTER — Ambulatory Visit
Admission: RE | Admit: 2020-09-23 | Discharge: 2020-09-23 | Disposition: A | Discharge status: Home / Self Care | Payer: BLUE CROSS/BLUE SHIELD | Source: Ambulatory Visit | Attending: Family Medicine | Admitting: Family Medicine

## 2020-09-23 VITALS — BP 116/64 | HR 59 | Temp 97.8°F | Resp 18 | Ht 70.0 in | Wt 175.0 lb

## 2020-09-23 DIAGNOSIS — T50905A Adverse effect of unspecified drugs, medicaments and biological substances, initial encounter: Secondary | ICD-10-CM | POA: Diagnosis not present

## 2020-09-23 DIAGNOSIS — R42 Dizziness and giddiness: Secondary | ICD-10-CM

## 2020-09-23 MED ORDER — MECLIZINE HCL 25 MG PO TABS: 25.0000 mg | ORAL_TABLET | Freq: Three times a day (TID) | ORAL | 0 refills | Status: DC | PRN

## 2020-09-23 NOTE — Discharge Instructions (Signed)
Stop the medication - Oxybutynin.  Meclizine if needed.  Lots of fluids.  Take care  Dr. Lacinda Axon

## 2020-09-23 NOTE — ED Triage Notes (Signed)
Patient c/o dizziness since this morning.   Patient states "I'm lightheaded and dizzy every time I stand up".   Patient was recently placed on Oxybutynin by Urologist. Urologist is concerned this cough be the cause of dizziness per patient statement.   Patient denies LOC.

## 2020-09-23 NOTE — ED Provider Notes (Signed)
[facility]    CSN: [id number] Arrival date & time: 09/23/20  1624      History   Chief Complaint Chief Complaint  Patient presents with   Dizziness   HPI   34 year old female presents with dizziness.  Patient reports lightheadedness/dizziness which started this morning.  She states that she is lightheaded and dizzy every time she stands up.  She states that she feels like she is in a pass out.  Patient was recently placed on oxybutynin by her urologist.  Concerned this may be the cause of her symptoms.  No relieving factors.  Dizziness has been persistent all day.  No reports of nausea.  No other associated symptoms.  No other complaints.  Past Medical History:  Diagnosis Date   Allergy    Cancer ([place])    skin- hystory of melanoma    Depression    Endometriosis    Fracture    right foot   Frequent UTI    History of shingles    Interstitial cystitis    Pap smear, abnormal    cryosx    PID (acute pelvic inflammatory disease)    hx of   Ureteral reflux    as a child     Patient Active Problem List   Diagnosis Date Noted   Acute swimmer's ear of both sides 07/03/2020   Otalgia of both ears 07/03/2020   Benign paroxysmal positional vertigo 07/03/2020   BV (bacterial vaginosis) 07/28/2018   Cough 04/05/2018   Routine general medical examination at a health care facility 03/24/2018   Hydradenitis 10/26/2017   Screen for STD (sexually transmitted disease) 01/23/2015   MRSA (methicillin resistant Staphylococcus aureus) carrier 04/12/2014   INTERSTITIAL CYSTITIS 06/25/2010   Allergic rhinitis 03/22/2007   SKIN CANCER, HX OF 03/22/2007    Past Surgical History:  Procedure Laterality Date   DILATION AND CURETTAGE OF UTERUS     EXCISION OF SKIN TAG N/A 02/11/2016   Procedure: EXCISION OF ANAL SKIN TAG AN INTERNAL PAPILLA ;  Surgeon: Leighton Ruff, MD;  Location: [facility];  Service: General;  Laterality: N/A;    LAPAROSCOPY     pelvic    MELANOMA EXCISION     Right leg    TONSILLECTOMY      OB History    Gravida  2   Para  0   Term      Preterm      AB  2   Living        SAB  1   TAB  1   Ectopic      Multiple      Live Births               Home Medications    Prior to Admission medications   Medication Sig Start Date End Date Taking? Authorizing Provider  ALPRAZolam Duanne Moron) 1 MG tablet  10/07/17  Yes [provider]  ELMIRON 100 MG capsule Take 2 tablets by mouth at bedtime.  02/18/17  Yes [provider]  hydrOXYzine (ATARAX/VISTARIL) 25 MG tablet Take 1 tablet (25 mg total) every 6 (six) hours by mouth. 10/05/17  Yes Avie Echevaria B, PA-C  norethindrone-ethinyl estradiol (MICROGESTIN,JUNEL,LOESTRIN) 1-20 MG-MCG tablet  06/01/18  Yes [provider]  oxybutynin (DITROPAN) 5 MG tablet Take 5 mg by mouth 3 (three) times daily.   Yes [provider]  albuterol (VENTOLIN HFA) 108 (90 Base) MCG/ACT inhaler Inhale 2 puffs into the lungs every 4 (  four) hours as needed for wheezing. 04/19/19   Marylene Land, NP  dicyclomine (BENTYL) 20 MG tablet Take 1 tablet (20 mg total) by mouth 2 (two) times daily. 09/10/20   Margarette Canada, NP  fluticasone (FLONASE) 50 MCG/ACT nasal spray Place 1 spray into both nostrils in the morning and at bedtime for 7 days. 08/16/20 09/10/20  Danton Clap, PA-C  meclizine (ANTIVERT) 25 MG tablet Take 1 tablet (25 mg total) by mouth 3 (three) times daily as needed for dizziness. 09/23/20   Coral Spikes, DO  ondansetron (ZOFRAN ODT) 8 MG disintegrating tablet Take 1 tablet (8 mg total) by mouth every 8 (eight) hours as needed for nausea or vomiting. 09/10/20   Margarette Canada, NP    Family History Family History  Problem Relation Age of Onset   Cancer Maternal Grandmother        breast   Diabetes Mother    Hypertension Mother    Neuropathy Mother        diabetic   Depression Mother    Asthma Father       Social History Social History   Tobacco Use   Smoking status: Former Smoker    Quit date: 02/04/2012    Years since quitting: 8.6   Smokeless tobacco: Never Used  Vaping Use   Vaping Use: Never used  Substance Use Topics   Alcohol use: Yes    Alcohol/week: 1.0 standard drink    Types: 1 Standard drinks or equivalent per week    Comment: occasionally   Drug use: No     Allergies   Cetirizine, Cetirizine hcl, Hydrocodone-acetaminophen, and Vicodin [hydrocodone-acetaminophen]   Review of Systems Review of Systems  Neurological: Positive for dizziness and light-headedness.   Physical Exam Triage Vital Signs ED Triage Vitals  Enc Vitals Group     BP 09/23/20 1711 116/64     Pulse Rate 09/23/20 1711 (!) 59     Resp 09/23/20 1711 18     Temp 09/23/20 1711 97.8 F (36.6 C)     Temp Source 09/23/20 1711 Oral     SpO2 09/23/20 1711 100 %     Weight 09/23/20 1707 175 lb (79.4 kg)     Height 09/23/20 1707 5\' 10"  (1.778 m)     Head Circumference --      Peak Flow --      Pain Score 09/23/20 1707 0     Pain Loc --      Pain Edu? --      Excl. in [place]? --    Updated Vital Signs BP 116/64 (BP Location: Left Arm)    Pulse (!) 59    Temp 97.8 F (36.6 C) (Oral)    Resp 18    Ht 5\' 10"  (1.778 m)    Wt 79.4 kg    SpO2 100%    BMI 25.11 kg/m   Visual Acuity Right Eye Distance:   Left Eye Distance:   Bilateral Distance:    Right Eye Near:   Left Eye Near:    Bilateral Near:     Physical Exam Vitals and nursing note reviewed.  Constitutional:      General: She is not in acute distress.    Appearance: Normal appearance. She is not ill-appearing.  HENT:     Head: Normocephalic and atraumatic.  Eyes:     General:        Right eye: No discharge.        Left eye: No discharge.  Conjunctiva/sclera: Conjunctivae normal.  Cardiovascular:     Rate and Rhythm: Normal rate and regular rhythm.     Heart sounds: No murmur heard.   Pulmonary:     Effort: Pulmonary  effort is normal.     Breath sounds: Normal breath sounds. No wheezing, rhonchi or rales.  Neurological:     Mental Status: She is alert.  Psychiatric:        Mood and Affect: Mood normal.        Behavior: Behavior normal.    UC Treatments / Results  Labs (all labs ordered are listed, but only abnormal results are displayed) Labs Reviewed - No data to display  EKG   Radiology No results found.  Procedures Procedures (including critical care time)  Medications Ordered in UC Medications - No data to display  Initial Impression / Assessment and Plan / UC Course  I have reviewed the triage vital signs and the nursing notes.  Pertinent labs & imaging results that were available during my care of the patient were reviewed by me and considered in my medical decision making (see chart for details).    34 year old female presents with dizziness.  I suspect that this is secondary to oxybutynin.  Stopping medication.  Meclizine as needed.  Hydration.  Supportive care.  Final Clinical Impressions(s) / UC Diagnoses   Final diagnoses:  Dizziness  Adverse effect of drug, initial encounter     Discharge Instructions     Stop the medication - Oxybutynin.  Meclizine if needed.  Lots of fluids.  Take care  Dr. Lacinda Axon    ED Prescriptions    Medication Sig Dispense Auth. Provider   meclizine (ANTIVERT) 25 MG tablet Take 1 tablet (25 mg total) by mouth 3 (three) times daily as needed for dizziness. 30 tablet Coral Spikes, DO     PDMP not reviewed this encounter.   Coral Spikes, [place] 09/23/20 1918

## 2020-10-05 ENCOUNTER — Other Ambulatory Visit: Payer: Self-pay

## 2020-10-05 ENCOUNTER — Ambulatory Visit
Admission: RE | Admit: 2020-10-05 | Discharge: 2020-10-05 | Disposition: A | Discharge status: Home / Self Care | Payer: BLUE CROSS/BLUE SHIELD | Source: Ambulatory Visit | Attending: Emergency Medicine | Admitting: Emergency Medicine

## 2020-10-05 VITALS — BP 101/92 | HR 76 | Temp 98.2°F | Resp 18 | Ht 70.0 in | Wt 175.0 lb

## 2020-10-05 DIAGNOSIS — Z20822 Contact with and (suspected) exposure to covid-19: Secondary | ICD-10-CM | POA: Insufficient documentation

## 2020-10-05 DIAGNOSIS — J0140 Acute pansinusitis, unspecified: Secondary | ICD-10-CM | POA: Diagnosis present

## 2020-10-05 DIAGNOSIS — J4520 Mild intermittent asthma, uncomplicated: Secondary | ICD-10-CM | POA: Diagnosis present

## 2020-10-05 DIAGNOSIS — Z760 Encounter for issue of repeat prescription: Secondary | ICD-10-CM | POA: Insufficient documentation

## 2020-10-05 MED ORDER — IBUPROFEN 600 MG PO TABS: 600.0000 mg | ORAL_TABLET | Freq: Four times a day (QID) | ORAL | 0 refills | Status: DC | PRN

## 2020-10-05 MED ORDER — ALBUTEROL SULFATE HFA 108 (90 BASE) MCG/ACT IN AERS: 2.0000 | INHALATION_SPRAY | RESPIRATORY_TRACT | 0 refills | Status: DC | PRN

## 2020-10-05 MED ORDER — AMOXICILLIN-POT CLAVULANATE 875-125 MG PO TABS: 1.0000 | ORAL_TABLET | Freq: Two times a day (BID) | ORAL | 0 refills | Status: DC

## 2020-10-05 MED ORDER — AEROCHAMBER PLUS MISC: 2 refills | Status: DC

## 2020-10-05 NOTE — ED Provider Notes (Signed)
HPI  SUBJECTIVE:  Pamela Calhoun is a 34 y.o. female who presents with 2 weeks of "having a cold".  She states that her nasal congestion went from clear to purulent 4 days ago, she reports fevers yesterday 100.5, left more than right ear pain and popping, and frontal/ maxillary facial pain starting today.  She reports body aches, nasal congestion, upper dental pain.  She states that her left cheek is swollen.  Reports a sore throat, postnasal drip, mild cough.  No loss of sense of smell or taste, wheezing, chest pain, shortness of breath, nausea vomiting, diarrhea, abdominal pain.  She got the second dose of the Pfizer Covid vaccine 1 month ago.  She is not yet gotten the flu vaccine.  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  No known Covid or flu exposure.  She states that she was getting better and then got acutely worse several days ago.  No antibiotics in the past month.  She took Tylenol 1000 mg within 6 hours of evaluation.  She has also been using Flonase, Mucinex DM, multiple OTC cold medications.  The OTC cold medications are no longer working.  She states that the Tylenol helps.  Symptoms are worse with bending forward.  She has a past medical history of frequent sinusitis and was evaluated by ENT 1 month ago.  She is awaiting a CT scan.  She has a history of allergies and seasonal asthma.  No history of diabetes, hypertension.  LMP: 2 months ago.  Denies the possibility of being pregnant.  She is on 52-month OCP cycle.  HRC:BULAG, Wynelle Fanny, MD     Past Medical History:  Diagnosis Date  . Allergy   . Cancer ([place])    skin- hystory of melanoma   . Depression   . Endometriosis   . Fracture    right foot  . Frequent UTI   . History of shingles   . Interstitial cystitis   . Pap smear, abnormal    cryosx   . PID (acute pelvic inflammatory disease)    hx of  . Ureteral reflux    as a child     Past Surgical History:  Procedure Laterality Date  . DILATION AND  CURETTAGE OF UTERUS    . EXCISION OF SKIN TAG N/A 02/11/2016   Procedure: EXCISION OF ANAL SKIN TAG AN INTERNAL PAPILLA ;  Surgeon: Leighton Ruff, MD;  Location: [facility];  Service: General;  Laterality: N/A;  . LAPAROSCOPY     pelvic   . MELANOMA EXCISION     Right leg   . TONSILLECTOMY      Family History  Problem Relation Age of Onset  . Cancer Maternal Grandmother        breast  . Diabetes Mother   . Hypertension Mother   . Neuropathy Mother        diabetic  . Depression Mother   . Asthma Father     Social History   Tobacco Use  . Smoking status: Former Smoker    Quit date: 02/04/2012    Years since quitting: 8.6  . Smokeless tobacco: Never Used  Vaping Use  . Vaping Use: Never used  Substance Use Topics  . Alcohol use: Yes    Alcohol/week: 1.0 standard drink    Types: 1 Standard drinks or equivalent per week    Comment: occasionally  . Drug use: No    No current facility-administered medications for this  encounter.  Current Outpatient Medications:  .  ALPRAZolam (XANAX) 1 MG tablet, , Disp: , Rfl: 0 .  dicyclomine (BENTYL) 20 MG tablet, Take 1 tablet (20 mg total) by mouth 2 (two) times daily., Disp: 20 tablet, Rfl: 0 .  ELMIRON 100 MG capsule, Take 2 tablets by mouth at bedtime. , Disp: , Rfl: 11 .  fluticasone (FLONASE) 50 MCG/ACT nasal spray, Place 1 spray into both nostrils in the morning and at bedtime for 7 days., Disp: 1 g, Rfl: 0 .  hydrOXYzine (ATARAX/VISTARIL) 25 MG tablet, Take 1 tablet (25 mg total) every 6 (six) hours by mouth., Disp: 12 tablet, Rfl: 0 .  norethindrone-ethinyl estradiol (MICROGESTIN,JUNEL,LOESTRIN) 1-20 MG-MCG tablet, , Disp: , Rfl: 11 .  albuterol (VENTOLIN HFA) 108 (90 Base) MCG/ACT inhaler, Inhale 2 puffs into the lungs every 4 (four) hours as needed for wheezing., Disp: 18 g, Rfl: 0 .  amoxicillin-clavulanate (AUGMENTIN) 875-125 MG tablet, Take 1 tablet by mouth 2 (two) times daily. X 7 days, Disp: 14 tablet, Rfl:  0 .  ibuprofen (ADVIL) 600 MG tablet, Take 1 tablet (600 mg total) by mouth every 6 (six) hours as needed., Disp: 30 tablet, Rfl: 0 .  Spacer/Aero-Holding Chambers (AEROCHAMBER PLUS) inhaler, Use with inhaler, Disp: 1 each, Rfl: 2  Allergies  Allergen Reactions  . Cetirizine Diarrhea    REACTION: diarrhea REACTION: diarrhea REACTION: diarrhea   . Cetirizine Hcl     REACTION: diarrhea  . Hydrocodone-Acetaminophen Itching  . Vicodin [Hydrocodone-Acetaminophen] Itching     ROS  As noted in HPI.   Physical Exam  BP (!) 101/92 (BP Location: Left Arm)   Pulse 76   Temp 98.2 F (36.8 C) (Oral)   Resp 18   Ht 5\' 10"  (1.778 m)   Wt 79.4 kg   LMP 08/05/2020 (Approximate) Comment: 3 month continuous OCP  SpO2 99%   BMI 25.11 kg/m   Constitutional: Well developed, well nourished, no acute distress Eyes:  EOMI, conjunctiva normal bilaterally HENT: Normocephalic, atraumatic,mucus membranes moist.  TMs normal bilaterally.  Pale, swollen turbinates with purulent nasal congestion.  Positive exquisite maxillary, frontal sinus tenderness.  Tonsils surgically absent.  Uvula midline.  No obvious postnasal drip. Neck: No cervical lymphadenopathy Respiratory: Normal inspiratory effort, lungs clear bilaterally Cardiovascular: Normal rate regular rhythm no murmurs rubs or gallops GI: nondistended nontender, no splenomegaly skin: No rash, skin intact Musculoskeletal: no deformities Neurologic: Alert & oriented x 3, no focal neuro deficits Psychiatric: Speech and behavior appropriate   ED Course   Medications - No data to display  Orders Placed This Encounter  Procedures  . SARS CORONAVIRUS 2 (TAT 6-24 HRS) Nasopharyngeal Nasopharyngeal Swab    Standing Status:   Standing    Number of Occurrences:   1    Order Specific Question:   Is this test for diagnosis or screening    Answer:   Diagnosis of ill patient    Order Specific Question:   Symptomatic for COVID-19 as defined by CDC     Answer:   Yes    Order Specific Question:   Date of Symptom Onset    Answer:   09/21/2020    Order Specific Question:   Hospitalized for COVID-19    Answer:   No    Order Specific Question:   Admitted to ICU for COVID-19    Answer:   No    Order Specific Question:   Previously tested for COVID-19    Answer:   Yes  Order Specific Question:   Resident in a congregate (group) care setting    Answer:   No    Order Specific Question:   Employed in healthcare setting    Answer:   No    Order Specific Question:   Pregnant    Answer:   No    Order Specific Question:   Has patient completed COVID vaccination(s) (2 doses of Pfizer/Moderna 1 dose of [employer])    Answer:   Yes    No results found for this or any previous visit (from the past 24 hour(s)). No results found.  ED Clinical Impression  1. Acute pansinusitis, recurrence not specified   2. Encounter for laboratory testing for COVID-19 virus   3. Intermittent asthma without complication, unspecified asthma severity   4. Medication refill      ED Assessment/Plan  Sending off Covid PCR.  Deferring influenza testing as she has been sick for 2 weeks and I doubt that this is a new infection.  Covid PCR is being required by work.  She may be a candidate for monoclonal antibody infusion based on the history of asthma.  Patient with a bacterial sinusitis- given double sickening, new purulent rhinorrhea, new facial pain.  Home with Augmentin, saline nasal irrigation, continue Flonase, start Mucinex D, Tylenol/ibuprofen together 3-4 times a day, discontinue other cold medications.  Patient also requesting refill of albuterol inhaler with spacer.  Discussed labs, MDM, treatment plan, and plan for follow-up with patient.  patient agrees with plan.   Meds ordered this encounter  Medications  . albuterol (VENTOLIN HFA) 108 (90 Base) MCG/ACT inhaler    Sig: Inhale 2 puffs into the lungs every 4 (four) hours as needed for wheezing.     Dispense:  18 g    Refill:  0  . Spacer/Aero-Holding Chambers (AEROCHAMBER PLUS) inhaler    Sig: Use with inhaler    Dispense:  1 each    Refill:  2    Please educate patient on use  . amoxicillin-clavulanate (AUGMENTIN) 875-125 MG tablet    Sig: Take 1 tablet by mouth 2 (two) times daily. X 7 days    Dispense:  14 tablet    Refill:  0  . ibuprofen (ADVIL) 600 MG tablet    Sig: Take 1 tablet (600 mg total) by mouth every 6 (six) hours as needed.    Dispense:  30 tablet    Refill:  0    *This clinic note was created using [employer]. Therefore, there may be occasional mistakes despite careful proofreading.   ?    Melynda Ripple, MD 10/05/20 [phone]

## 2020-10-05 NOTE — ED Triage Notes (Signed)
Patient in today c/o 2 week history of cough, nasal congestion and bilateral ear pain (R>L) x 2 weeks. Patient states she was sent home from work yesterday for fever (100.5) and facial pain/pressure x 1 day. Patient has taken OTC cold plus and Mucinex DM.

## 2020-10-05 NOTE — Discharge Instructions (Addendum)
Finish the Augmentin, even if you feel better.  Start Mucinex-D to keep the mucous thin and to decongest you.  Continue Flonase.  Stop all other cold medications.  Return to the ER if you get worse, have a fever >100.4, or for any concerns. You may take 600 mg of motrin with 1 gram of tylenol up to 3-4 times a day as needed for pain. This is an effective combination for pain.   Use a [employer]Med sinus rinse with distilled water as often as you want to to reduce nasal congestion. Follow the directions on the box.   Go to www.goodrx.com to look up your medications. This will give you a list of where you can find your prescriptions at the most affordable prices. Or you can ask the pharmacist what the cash price is. This is frequently cheaper than going through insurance.

## 2020-10-06 LAB — SARS CORONAVIRUS 2 (TAT 6-24 HRS): SARS Coronavirus 2: NEGATIVE

## 2020-10-24 ENCOUNTER — Other Ambulatory Visit: Payer: Self-pay

## 2020-10-24 ENCOUNTER — Ambulatory Visit
Admission: RE | Admit: 2020-10-24 | Discharge: 2020-10-24 | Disposition: A | Discharge status: Home / Self Care | Payer: BLUE CROSS/BLUE SHIELD | Source: Ambulatory Visit | Attending: Internal Medicine | Admitting: Internal Medicine

## 2020-10-24 VITALS — BP 111/71 | HR 94 | Temp 98.0°F | Resp 16 | Ht 70.0 in | Wt 175.0 lb

## 2020-10-24 DIAGNOSIS — R197 Diarrhea, unspecified: Secondary | ICD-10-CM | POA: Diagnosis not present

## 2020-10-24 MED ORDER — ONDANSETRON 8 MG PO TBDP: 8.0000 mg | ORAL_TABLET | Freq: Three times a day (TID) | ORAL | 0 refills | Status: DC | PRN

## 2020-10-24 MED ORDER — ONDANSETRON 8 MG PO TBDP
8.0000 mg | ORAL_TABLET | Freq: Once | ORAL | Status: AC
  Administered 2020-10-24: 8 mg via ORAL

## 2020-10-24 NOTE — ED Triage Notes (Signed)
Pt reports awaking at 0400 this a.m. with liquid diarrhea. Has nausea, no vomiting. Endorses generalized abdominal pain. Pt reports she has had 8 episodes of diarrhea today. 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

## 2020-10-24 NOTE — Discharge Instructions (Addendum)
Bring the stool sample when you collect it to check for bacteria specially one that is associated with antibiotics called Clostridium Deficil. Stan on [employer], avoid dairy until your stools are formed.

## 2020-10-24 NOTE — ED Provider Notes (Signed)
[facility]    CSN: [id number] Arrival date & time: 10/24/20  1756      History   Chief Complaint Chief Complaint  Patient presents with  . Abdominal Pain  . Diarrhea    HPI Pamela Calhoun is a 34 y.o. female who presents due to waking up at 4 am with diarrhea and nausea. 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 She denies a fever or vomiting. Was on Augmenting 3 weeks ago. Denies going out of the country in the past month. Has intermittent cramps alleviated after having diarrhea. Has been drinking Gatorade and Pedialyte. Has had 8 watery BM's so far.  Past Medical History:  Diagnosis Date  . Allergy   . Cancer ([place])    skin- hystory of melanoma   . Depression   . Endometriosis   . Fracture    right foot  . Frequent UTI   . History of shingles   . Interstitial cystitis   . Pap smear, abnormal    cryosx   . PID (acute pelvic inflammatory disease)    hx of  . Ureteral reflux    as a child     Patient Active Problem List   Diagnosis Date Noted  . Acute swimmer's ear of both sides 07/03/2020  . Otalgia of both ears 07/03/2020  . Benign paroxysmal positional vertigo 07/03/2020  . BV (bacterial vaginosis) 07/28/2018  . Cough 04/05/2018  . Routine general medical examination at a health care facility 03/24/2018  . Hydradenitis 10/26/2017  . Screen for STD (sexually transmitted disease) 01/23/2015  . MRSA (methicillin resistant Staphylococcus aureus) carrier 04/12/2014  . INTERSTITIAL CYSTITIS 06/25/2010  . Allergic rhinitis 03/22/2007  . SKIN CANCER, HX OF 03/22/2007    Past Surgical History:  Procedure Laterality Date  . DILATION AND CURETTAGE OF UTERUS    . EXCISION OF SKIN TAG Pamela Calhoun 02/11/2016   Procedure: EXCISION OF ANAL SKIN TAG AN INTERNAL PAPILLA ;  Surgeon: Leighton Ruff, MD;  Location: [facility];  Service: General;  Laterality: Pamela Calhoun;  . LAPAROSCOPY     pelvic   . MELANOMA EXCISION     Right leg   .  TONSILLECTOMY      OB History    Gravida  2   Para  0   Term      Preterm      AB  2   Living        SAB  1   TAB  1   Ectopic      Multiple      Live Births              Home Medications    Prior to Admission medications   Medication Sig Start Date End Date Taking? Authorizing Provider  albuterol (VENTOLIN HFA) 108 (90 Base) MCG/ACT inhaler Inhale 2 puffs into the lungs every 4 (four) hours as needed for wheezing. 10/05/20   Melynda Ripple, MD  ALPRAZolam Duanne Moron) 1 MG tablet  10/07/17   [provider]  amoxicillin-clavulanate (AUGMENTIN) 875-125 MG tablet Take 1 tablet by mouth 2 (two) times daily. X 7 days 10/05/20   Melynda Ripple, MD  dicyclomine (BENTYL) 20 MG tablet Take 1 tablet (20 mg total) by mouth 2 (two) times daily. 09/10/20   Margarette Canada, NP  ELMIRON 100 MG capsule Take 2 tablets by mouth at bedtime.  02/18/17   [provider]  fluticasone (FLONASE) 50 MCG/ACT nasal spray Place  1 spray into both nostrils in the morning and at bedtime for 7 days. 08/16/20 10/05/20  Laurene Footman B, PA-C  hydrOXYzine (ATARAX/VISTARIL) 25 MG tablet Take 1 tablet (25 mg total) every 6 (six) hours by mouth. 10/05/17   Avie Echevaria B, PA-C  ibuprofen (ADVIL) 600 MG tablet Take 1 tablet (600 mg total) by mouth every 6 (six) hours as needed. 10/05/20   Melynda Ripple, MD  norethindrone-ethinyl estradiol Gentry Roch) 1-20 MG-MCG tablet  06/01/18   [provider]  Spacer/Aero-Holding Chambers (AEROCHAMBER PLUS) inhaler Use with inhaler 10/05/20   Melynda Ripple, MD    Family History Family History  Problem Relation Age of Onset  . Cancer Maternal Grandmother        breast  . Diabetes Mother   . Hypertension Mother   . Neuropathy Mother        diabetic  . Depression Mother   . Asthma Father     Social History Social History   Tobacco Use  . Smoking status: Former Smoker    Quit date: 02/04/2012    Years  since quitting: 8.7  . Smokeless tobacco: Never Used  Vaping Use  . Vaping Use: Never used  Substance Use Topics  . Alcohol use: Yes    Alcohol/week: 1.0 standard drink    Types: 1 Standard drinks or equivalent per week    Comment: occasionally  . Drug use: No     Allergies   Cetirizine, Cetirizine hcl, Hydrocodone-acetaminophen, and Vicodin [hydrocodone-acetaminophen]   Review of Systems Review of Systems  Constitutional: Positive for appetite change and fatigue. Negative for activity change, chills, diaphoresis and fever.  HENT: Negative for congestion.   Respiratory: Negative for cough.   Gastrointestinal: Positive for abdominal pain, diarrhea and nausea. Negative for blood in stool and vomiting.  Musculoskeletal: Negative for gait problem and myalgias.  Skin: Negative for rash.  Neurological: Negative for headaches.   Physical Exam Triage Vital Signs ED Triage Vitals  Enc Vitals Group     BP 10/24/20 1825 111/71     Pulse Rate 10/24/20 1825 94     Resp 10/24/20 1825 16     Temp 10/24/20 1825 98 F (36.7 C)     Temp Source 10/24/20 1825 Oral     SpO2 10/24/20 1825 100 %     Weight 10/24/20 1823 175 lb (79.4 kg)     Height 10/24/20 1823 5\' 10"  (1.778 m)     Head Circumference --      Peak Flow --      Pain Score --      Pain Loc --      Pain Edu? --      Excl. in [place]? --    No data found.  Updated Vital Signs BP 111/71 (BP Location: Left Arm)   Pulse 94   Temp 98 F (36.7 C) (Oral)   Resp 16   Ht 5\' 10"  (1.778 m)   Wt 175 lb (79.4 kg)   LMP 10/16/2020   SpO2 100%   BMI 25.11 kg/m   Visual Acuity Right Eye Distance:   Left Eye Distance:   Bilateral Distance:    Right Eye Near:   Left Eye Near:    Bilateral Near:     Physical Exam Vitals and nursing note reviewed.  Constitutional:      General: She is not in acute distress.    Appearance: She is not toxic-appearing.  HENT:     Head: Normocephalic.  Pulmonary:  Effort: Pulmonary effort  is normal.  Abdominal:     General: Bowel sounds are normal. There is no distension.     Palpations: Abdomen is soft. There is no hepatomegaly or splenomegaly.     Tenderness: There is generalized abdominal tenderness.  Skin:    General: Skin is warm and dry.     Findings: No rash.  Neurological:     Mental Status: She is alert and oriented to person, place, and time.  Psychiatric:        Mood and Affect: Mood normal.        Behavior: Behavior normal.    Re-examination of abdomen at dischareg - no tenderness  UC Treatments / Results  Labs (all labs ordered are listed, but only abnormal results are displayed) Labs Reviewed - No data to display  EKG   Radiology No results found.  Procedures Procedures (including critical care time)  Medications Ordered in UC Medications - No data to display  Initial Impression / Assessment and Plan / UC Course  I have reviewed the triage vital signs and the nursing notes. She was given Zofran ODT 8 mg  And was able to drink 8 oz of water and her abdominal pain was much improved after that. She was placed on BRAT diet, I sent more Zofran and off work til Monday.  C-diff stool test ordered.    Final Clinical Impressions(s) / UC Diagnoses   Final diagnoses:  None   Discharge Instructions   None    ED Prescriptions    None     PDMP not reviewed this encounter.   Shelby Mattocks, [place] 10/24/20 1942

## 2020-11-05 ENCOUNTER — Other Ambulatory Visit: Payer: Self-pay

## 2020-11-05 ENCOUNTER — Ambulatory Visit
Admission: RE | Admit: 2020-11-05 | Discharge: 2020-11-05 | Disposition: A | Discharge status: Home / Self Care | Payer: BLUE CROSS/BLUE SHIELD | Source: Ambulatory Visit | Attending: Emergency Medicine | Admitting: Emergency Medicine

## 2020-11-05 VITALS — BP 114/80 | HR 91 | Temp 98.3°F | Resp 18 | Ht 70.0 in | Wt 170.0 lb

## 2020-11-05 DIAGNOSIS — N390 Urinary tract infection, site not specified: Secondary | ICD-10-CM | POA: Diagnosis not present

## 2020-11-05 LAB — URINALYSIS, COMPLETE (UACMP) WITH MICROSCOPIC
Bilirubin Urine: NEGATIVE
Glucose, UA: NEGATIVE mg/dL
Ketones, ur: NEGATIVE mg/dL
Leukocytes,Ua: NEGATIVE
Nitrite: NEGATIVE
Protein, ur: NEGATIVE mg/dL
Specific Gravity, Urine: 1.02 (ref 1.005–1.030)
pH: 6 (ref 5.0–8.0)

## 2020-11-05 MED ORDER — PHENAZOPYRIDINE HCL 200 MG PO TABS: 200.0000 mg | ORAL_TABLET | Freq: Three times a day (TID) | ORAL | 0 refills | Status: DC

## 2020-11-05 MED ORDER — CEPHALEXIN 500 MG PO CAPS
500.0000 mg | ORAL_CAPSULE | Freq: Four times a day (QID) | ORAL | 0 refills | Status: AC
Start: 2020-11-05 — End: 2020-11-10

## 2020-11-05 NOTE — Discharge Instructions (Addendum)
Take the Keflex twice daily for 5 days.  Use the Pyridium every 8 hours as needed for urinary discomfort.  This will turn your urine bright orange.  Increase your oral fluid intake to help increase your urine production to flush your urinary system.  We will send urine for culture and if we need to make adjustments to the antibiotics we will.  For your interstitial cystitis:  Try taking aloe vera, 2 gelcaps 3 times a day or 3 ounces of juice 3 times a day with meals.  Does not harvest is a good brand of gelcap and Lilly of [place] is a good brand of juice.  Both are available at [facility].

## 2020-11-05 NOTE — ED Triage Notes (Signed)
Patient c/o dysuria, bladder spasms and urinary frequency that started 1 week ago.

## 2020-11-05 NOTE — ED Provider Notes (Signed)
[facility]    CSN: [id number] Arrival date & time: 11/05/20  1700      History   Chief Complaint Chief Complaint  Patient presents with  . Dysuria  . Urinary Frequency    HPI Pamela Calhoun is a 34 y.o. female.   HPI   34 year old female here for evaluation of painful urination, bladder spasms, and urinary frequency.  Patient reports that she has had symptoms for the past week.  Patient has a history of interstitial cystitis so she is never quite sure if she has an infection or not.  Patient has had some associated low back pain, blood in urine, cloudiness to her urine, suprapubic pain, and nausea.  Patient denies fever or vomiting.  Just prior to her symptoms starting she had an episode of diarrhea for which she was evaluated in the emergency department on 10/24/2020.  Patient is followed by urology.  Urology asked her to come to the urgent care to be tested to see if she has an infection.  Past Medical History:  Diagnosis Date  . Allergy   . Cancer ([place])    skin- hystory of melanoma   . Depression   . Endometriosis   . Fracture    right foot  . Frequent UTI   . History of shingles   . Interstitial cystitis   . Pap smear, abnormal    cryosx   . PID (acute pelvic inflammatory disease)    hx of  . Ureteral reflux    as a child     Patient Active Problem List   Diagnosis Date Noted  . Acute swimmer's ear of both sides 07/03/2020  . Otalgia of both ears 07/03/2020  . Benign paroxysmal positional vertigo 07/03/2020  . BV (bacterial vaginosis) 07/28/2018  . Cough 04/05/2018  . Routine general medical examination at a health care facility 03/24/2018  . Hydradenitis 10/26/2017  . Screen for STD (sexually transmitted disease) 01/23/2015  . MRSA (methicillin resistant Staphylococcus aureus) carrier 04/12/2014  . INTERSTITIAL CYSTITIS 06/25/2010  . Allergic rhinitis 03/22/2007  . SKIN CANCER, HX OF 03/22/2007    Past Surgical History:  Procedure  Laterality Date  . DILATION AND CURETTAGE OF UTERUS    . EXCISION OF SKIN TAG N/A 02/11/2016   Procedure: EXCISION OF ANAL SKIN TAG AN INTERNAL PAPILLA ;  Surgeon: Leighton Ruff, MD;  Location: [facility];  Service: General;  Laterality: N/A;  . LAPAROSCOPY     pelvic   . MELANOMA EXCISION     Right leg   . TONSILLECTOMY      OB History    Gravida  2   Para  0   Term      Preterm      AB  2   Living        SAB  1   IAB  1   Ectopic      Multiple      Live Births               Home Medications    Prior to Admission medications   Medication Sig Start Date End Date Taking? Authorizing Provider  albuterol (VENTOLIN HFA) 108 (90 Base) MCG/ACT inhaler Inhale 2 puffs into the lungs every 4 (four) hours as needed for wheezing. 10/05/20  Yes Melynda Ripple, MD  ALPRAZolam Duanne Moron) 1 MG tablet  10/07/17  Yes [provider]  baclofen (LIORESAL) 10 MG tablet Take 10 mg by mouth 3 (three) times  daily as needed. 07/27/20  Yes [provider]  ELMIRON 100 MG capsule Take 2 tablets by mouth at bedtime.  02/18/17  Yes [provider]  hydrOXYzine (ATARAX/VISTARIL) 25 MG tablet Take 1 tablet (25 mg total) every 6 (six) hours by mouth. 10/05/17  Yes Avie Echevaria B, PA-C  ibuprofen (ADVIL) 600 MG tablet Take 1 tablet (600 mg total) by mouth every 6 (six) hours as needed. 10/05/20  Yes Melynda Ripple, MD  norethindrone-ethinyl estradiol (MICROGESTIN,JUNEL,LOESTRIN) 1-20 MG-MCG tablet  06/01/18  Yes [provider]  cephALEXin (KEFLEX) 500 MG capsule Take 1 capsule (500 mg total) by mouth 4 (four) times daily for 5 days. 11/05/20 11/10/20  Margarette Canada, NP  fluticasone (FLONASE) 50 MCG/ACT nasal spray Place 1 spray into both nostrils in the morning and at bedtime for 7 days. 08/16/20 10/05/20  Laurene Footman B, PA-C  ondansetron (ZOFRAN-ODT) 8 MG disintegrating tablet Take 1 tablet (8 mg total) by mouth every 8 (eight) hours as  needed for nausea or vomiting. 10/24/20   Rodriguez-Southworth, Sunday Spillers, PA-C  phenazopyridine (PYRIDIUM) 200 MG tablet Take 1 tablet (200 mg total) by mouth 3 (three) times daily. 11/05/20   Margarette Canada, NP  Spacer/Aero-Holding Josiah Lobo (AEROCHAMBER PLUS) inhaler Use with inhaler 10/05/20   Melynda Ripple, MD  dicyclomine (BENTYL) 20 MG tablet Take 1 tablet (20 mg total) by mouth 2 (two) times daily. 09/10/20 11/05/20  Margarette Canada, NP    Family History Family History  Problem Relation Age of Onset  . Cancer Maternal Grandmother        breast  . Diabetes Mother   . Hypertension Mother   . Neuropathy Mother        diabetic  . Depression Mother   . Asthma Father     Social History Social History   Tobacco Use  . Smoking status: Former Smoker    Quit date: 02/04/2012    Years since quitting: 8.7  . Smokeless tobacco: Never Used  Vaping Use  . Vaping Use: Never used  Substance Use Topics  . Alcohol use: Yes    Alcohol/week: 1.0 standard drink    Types: 1 Standard drinks or equivalent per week    Comment: occasionally  . Drug use: No     Allergies   Cetirizine, Cetirizine hcl, Hydrocodone-acetaminophen, and Vicodin [hydrocodone-acetaminophen]   Review of Systems Review of Systems  Constitutional: Negative for activity change, appetite change and fever.  Gastrointestinal: Positive for abdominal pain and nausea. Negative for diarrhea and vomiting.  Genitourinary: Positive for dysuria, frequency, hematuria and urgency.  Musculoskeletal: Positive for back pain.  Skin: Negative for rash.  Hematological: Negative.   Psychiatric/Behavioral: Negative.      Physical Exam Triage Vital Signs ED Triage Vitals  Enc Vitals Group     BP 11/05/20 1738 114/80     Pulse Rate 11/05/20 1738 91     Resp 11/05/20 1738 18     Temp 11/05/20 1738 98.3 F (36.8 C)     Temp Source 11/05/20 1738 Oral     SpO2 11/05/20 1738 100 %     Weight 11/05/20 1736 170 lb (77.1 kg)     Height  11/05/20 1736 5\' 10"  (1.778 m)     Head Circumference --      Peak Flow --      Pain Score 11/05/20 1736 7     Pain Loc --      Pain Edu? --      Excl. in [place]? --  No data found.  Updated Vital Signs BP 114/80 (BP Location: Right Arm)   Pulse 91   Temp 98.3 F (36.8 C) (Oral)   Resp 18   Ht 5\' 10"  (1.778 m)   Wt 170 lb (77.1 kg)   LMP 10/16/2020   SpO2 100%   BMI 24.39 kg/m   Visual Acuity Right Eye Distance:   Left Eye Distance:   Bilateral Distance:    Right Eye Near:   Left Eye Near:    Bilateral Near:     Physical Exam Vitals and nursing note reviewed.  Constitutional:      General: She is not in acute distress.    Appearance: Normal appearance. She is normal weight. She is not toxic-appearing.  HENT:     Head: Normocephalic and atraumatic.  Cardiovascular:     Rate and Rhythm: Normal rate.     Pulses: Normal pulses.     Heart sounds: Normal heart sounds. No murmur heard. No gallop.   Pulmonary:     Effort: Pulmonary effort is normal.     Breath sounds: Normal breath sounds. No wheezing, rhonchi or rales.  Abdominal:     General: Abdomen is flat. Bowel sounds are normal.     Palpations: Abdomen is soft.     Tenderness: There is abdominal tenderness. There is no right CVA tenderness, left CVA tenderness, guarding or rebound.     Comments: Patient has mild suprapubic tenderness.  Musculoskeletal:        General: No swelling or tenderness. Normal range of motion.     Cervical back: Normal range of motion and neck supple.  Lymphadenopathy:     Cervical: No cervical adenopathy.  Skin:    General: Skin is warm and dry.     Capillary Refill: Capillary refill takes less than 2 seconds.     Findings: No erythema or rash.  Neurological:     General: No focal deficit present.     Mental Status: She is alert and oriented to person, place, and time.  Psychiatric:        Mood and Affect: Mood normal.        Behavior: Behavior normal.        Thought Content:  Thought content normal.        Judgment: Judgment normal.      UC Treatments / Results  Labs (all labs ordered are listed, but only abnormal results are displayed) Labs Reviewed  URINALYSIS, COMPLETE (UACMP) WITH MICROSCOPIC - Abnormal; Notable for the following components:      Result Value   Hgb urine dipstick TRACE (*)    Bacteria, UA MANY (*)    All other components within normal limits  URINE CULTURE    EKG   Radiology No results found.  Procedures Procedures (including critical care time)  Medications Ordered in UC Medications - No data to display  Initial Impression / Assessment and Plan / UC Course  I have reviewed the triage vital signs and the nursing notes.  Pertinent labs & imaging results that were available during my care of the patient were reviewed by me and considered in my medical decision making (see chart for details).   Is here for evaluation of possible UTI.  Patient's been having symptoms for the past week.  Patient is nontoxic in appearance.  Patient does have mild suprapubic tenderness on physical exam but no CVA tenderness.  Has a history of interstitial cystitis but she also had a recent bout of diarrhea.  Patient symptoms are consistent with UTI.  Will send UA for evaluation.  Urinalysis is equivocal.  Patient does have trace blood in her urine negative leukocytes, negative nitrites.  6-10 squamous and 6-10 WBCs but many bacteria.  Will send urine for culture.  Given history of IC I am going to opt to treat will place patient on Keflex twice daily x5 days.  Will give Pyridium to help with urinary discomfort.  Patient to follow-up with urology.   Final Clinical Impressions(s) / UC Diagnoses   Final diagnoses:  Lower urinary tract infectious disease     Discharge Instructions     Take the Keflex twice daily for 5 days.  Use the Pyridium every 8 hours as needed for urinary discomfort.  This will turn your urine bright orange.  Increase  your oral fluid intake to help increase your urine production to flush your urinary system.  We will send urine for culture and if we need to make adjustments to the antibiotics we will.  For your interstitial cystitis:  Try taking aloe vera, 2 gelcaps 3 times a day or 3 ounces of juice 3 times a day with meals.  Does not harvest is a good brand of gelcap and Lilly of [place] is a good brand of juice.  Both are available at [facility].    ED Prescriptions    Medication Sig Dispense Auth. Provider   cephALEXin (KEFLEX) 500 MG capsule Take 1 capsule (500 mg total) by mouth 4 (four) times daily for 5 days. 20 capsule Margarette Canada, NP   phenazopyridine (PYRIDIUM) 200 MG tablet Take 1 tablet (200 mg total) by mouth 3 (three) times daily. 6 tablet Margarette Canada, NP     PDMP not reviewed this encounter.   Margarette Canada, NP 11/05/20 1840

## 2020-11-06 LAB — URINE CULTURE
Culture: <10000 — AB
Special Requests: NORMAL

## 2020-11-29 ENCOUNTER — Ambulatory Visit: Payer: Self-pay

## 2021-04-01 ENCOUNTER — Telehealth (INDEPENDENT_AMBULATORY_CARE_PROVIDER_SITE_OTHER): Payer: 59 | Admitting: Family Medicine

## 2021-04-01 ENCOUNTER — Encounter: Payer: Self-pay | Admitting: Family Medicine

## 2021-04-01 ENCOUNTER — Other Ambulatory Visit: Payer: Self-pay

## 2021-04-01 DIAGNOSIS — J0100 Acute maxillary sinusitis, unspecified: Secondary | ICD-10-CM

## 2021-04-01 MED ORDER — AMOXICILLIN-POT CLAVULANATE 875-125 MG PO TABS: 1.0000 | ORAL_TABLET | Freq: Two times a day (BID) | ORAL | 0 refills | Status: DC

## 2021-04-01 NOTE — Progress Notes (Signed)
Virtual Visit via Video Note  I connected with Pamela Calhoun on 04/01/21 at 11:30 AM EDT by a video enabled telemedicine application and verified that I am speaking with the correct person using two identifiers.  Location: Patient: home Provider: office   I discussed the limitations of evaluation and management by telemedicine and the availability of in person appointments. The patient expressed understanding and agreed to proceed.  Parties involved in encounter  Patient: Pamela Calhoun  Provider:  Loura Pardon MD   History of Present Illness: Pt presents with uri symptoms and facial pain in the setting of a negative covid test  Has had a cold for 2 weeks Worse now  Friday -yellow/green nasal mucous and face started hurting Throat and ears hurt over the past 4 days  Facial pain under eyes/worse on the R  Ear pain- is sharp /stabbing   Temp went up -intermittent (had chills-better now) Still achey Never over 101   She had a little nausea -better covid test neg on Friday   Cough is worse over past few  Some colored phlegm  No wheezing   Had 2 covid shots and will get booster soon   Otc:  flonase Allegra Alka selzer cold and flu mucinex DM  From ENT- [facility]  She had covid in January - no hosp but sick for a month     Patient Active Problem List   Diagnosis Date Noted  . Acute swimmer's ear of both sides 07/03/2020  . Otalgia of both ears 07/03/2020  . Benign paroxysmal positional vertigo 07/03/2020  . BV (bacterial vaginosis) 07/28/2018  . Cough 04/05/2018  . Routine general medical examination at a health care facility 03/24/2018  . Hydradenitis 10/26/2017  . Screen for STD (sexually transmitted disease) 01/23/2015  . Acute sinusitis 08/31/2014  . MRSA (methicillin resistant Staphylococcus aureus) carrier 04/12/2014  . INTERSTITIAL CYSTITIS 06/25/2010  . Allergic rhinitis 03/22/2007  . SKIN CANCER, HX OF 03/22/2007   Past Medical History:   Diagnosis Date  . Allergy   . Cancer ([place])    skin- hystory of melanoma   . Depression   . Endometriosis   . Fracture    right foot  . Frequent UTI   . History of shingles   . Interstitial cystitis   . Pap smear, abnormal    cryosx   . PID (acute pelvic inflammatory disease)    hx of  . Ureteral reflux    as a child    Past Surgical History:  Procedure Laterality Date  . DILATION AND CURETTAGE OF UTERUS    . EXCISION OF SKIN TAG N/A 02/11/2016   Procedure: EXCISION OF ANAL SKIN TAG AN INTERNAL PAPILLA ;  Surgeon: Leighton Ruff, MD;  [facility];  Service: General;  Laterality: N/A;  . LAPAROSCOPY     pelvic   . MELANOMA EXCISION     Right leg   . TONSILLECTOMY     Social History   Tobacco Use  . Smoking status: Former Smoker    Quit date: 02/04/2012    Years since quitting: 9.1  . Smokeless tobacco: Never Used  Vaping Use  . Vaping Use: Never used  Substance Use Topics  . Alcohol use: Yes    Alcohol/week: 1.0 standard drink    Types: 1 Standard drinks or equivalent per week    Comment: occasionally  . Drug use: No   Family History  Problem Relation Age of Onset  . Cancer Maternal Grandmother  breast  . Diabetes Mother   . Hypertension Mother   . Neuropathy Mother        diabetic  . Depression Mother   . Asthma Father    Allergies  Allergen Reactions  . Cetirizine Diarrhea    REACTION: diarrhea REACTION: diarrhea REACTION: diarrhea   . Cetirizine Hcl     REACTION: diarrhea  . Hydrocodone-Acetaminophen Itching  . Vicodin [Hydrocodone-Acetaminophen] Itching   Current Outpatient Medications on File Prior to Visit  Medication Sig Dispense Refill  . albuterol (VENTOLIN HFA) 108 (90 Base) MCG/ACT inhaler Inhale 2 puffs into the lungs every 4 (four) hours as needed for wheezing. 18 g 0  . ALPRAZolam (XANAX) 1 MG tablet   0  . baclofen (LIORESAL) 10 MG tablet Take 10 mg by mouth 3 (three) times daily as needed.    Marland Kitchen  ELMIRON 100 MG capsule Take 2 tablets by mouth at bedtime.   11  . hydrOXYzine (ATARAX/VISTARIL) 25 MG tablet Take 1 tablet (25 mg total) every 6 (six) hours by mouth. 12 tablet 0  . ibuprofen (ADVIL) 600 MG tablet Take 1 tablet (600 mg total) by mouth every 6 (six) hours as needed. 30 tablet 0  . norethindrone-ethinyl estradiol (MICROGESTIN,JUNEL,LOESTRIN) 1-20 MG-MCG tablet   11  . ondansetron (ZOFRAN-ODT) 8 MG disintegrating tablet Take 1 tablet (8 mg total) by mouth every 8 (eight) hours as needed for nausea or vomiting. 20 tablet 0  . Spacer/Aero-Holding Chambers (AEROCHAMBER PLUS) inhaler Use with inhaler 1 each 2  . fluticasone (FLONASE) 50 MCG/ACT nasal spray Place 1 spray into both nostrils in the morning and at bedtime for 7 days. 1 g 0  . [DISCONTINUED] dicyclomine (BENTYL) 20 MG tablet Take 1 tablet (20 mg total) by mouth 2 (two) times daily. 20 tablet 0   No current facility-administered medications on file prior to visit.   Review of Systems  Constitutional: Negative for chills, fever and malaise/fatigue.  HENT: Positive for congestion, sinus pain and sore throat. Negative for ear pain.   Eyes: Negative for blurred vision, discharge and redness.  Respiratory: Positive for cough. Negative for sputum production, shortness of breath and stridor.   Cardiovascular: Negative for chest pain, palpitations and leg swelling.  Gastrointestinal: Negative for abdominal pain, diarrhea, nausea and vomiting.  Musculoskeletal: Negative for myalgias.  Skin: Negative for rash.  Neurological: Negative for dizziness and headaches.    Observations/Objective: Patient appears well, in no distress Weight is baseline  No facial swelling or asymmetry, some tenderness in cheeks Hoarse voice is also congested sounding  No obvious tremor or mobility impairment Moving neck and UEs normally Able to hear the call well  No cough or shortness of breath during interview  Talkative and mentally sharp with no  cognitive changes No skin changes on face or neck , no rash or pallor Affect is normal    Assessment and Plan: Problem List Items Addressed This Visit      Respiratory   Acute sinusitis - Primary    2 wk s/p uri (neg covid test in immunized pt) Fluids/rest/saline Breathe steam if congested Symptom control-mucinex dm augmentin as directed ER parameters discussed  Update if not starting to improve in a week or if worsening         Relevant Medications   amoxicillin-clavulanate (AUGMENTIN) 875-125 MG tablet       Follow Up Instructions: Drink fluids Take the augmentin as directed  Breathe steam  Use nasal saline spray if it helps  Get  rest  Watch temperature  mucinex DM may help cough and congestion Tylenol for pain and fever   Update if not starting to improve in a week or if worsening    I discussed the assessment and treatment plan with the patient. The patient was provided an opportunity to ask questions and all were answered. The patient agreed with the plan and demonstrated an understanding of the instructions.   The patient was advised to call back or seek an in-person evaluation if the symptoms worsen or if the condition fails to improve as anticipated.     Loura Pardon, MD

## 2021-04-01 NOTE — Assessment & Plan Note (Signed)
2 wk s/p uri (neg covid test in immunized pt) Fluids/rest/saline Breathe steam if congested Symptom control-mucinex dm augmentin as directed ER parameters discussed  Update if not starting to improve in a week or if worsening

## 2021-04-01 NOTE — Patient Instructions (Signed)
Drink fluids Take the augmentin as directed  Breathe steam  Use nasal saline spray if it helps  Get rest  Watch temperature  mucinex DM may help cough and congestion Tylenol for pain and fever   Update if not starting to improve in a week or if worsening

## 2021-05-19 ENCOUNTER — Other Ambulatory Visit: Payer: Self-pay

## 2021-05-19 ENCOUNTER — Telehealth: Payer: Self-pay | Admitting: Family Medicine

## 2021-05-19 ENCOUNTER — Other Ambulatory Visit (INDEPENDENT_AMBULATORY_CARE_PROVIDER_SITE_OTHER): Payer: 59

## 2021-05-19 DIAGNOSIS — R300 Dysuria: Secondary | ICD-10-CM

## 2021-05-19 DIAGNOSIS — R8290 Unspecified abnormal findings in urine: Secondary | ICD-10-CM

## 2021-05-19 LAB — POC URINALSYSI DIPSTICK (AUTOMATED)
Bilirubin, UA: NEGATIVE
Blood, UA: 25
Glucose, UA: NEGATIVE
Ketones, UA: 5
Nitrite, UA: NEGATIVE
Protein, UA: POSITIVE — AB
Spec Grav, UA: 1.025 (ref 1.010–1.025)
Urobilinogen, UA: 0.2 E.U./dL
pH, UA: 6 (ref 5.0–8.0)

## 2021-05-19 NOTE — Telephone Encounter (Signed)
Yes, she is chronic  Please let me know when her ua gets processed  Thanks

## 2021-05-19 NOTE — Telephone Encounter (Signed)
Patient believes that she may have a bladder infection. Patient is requesting to come in and leave a sample as she has a chronic bladder disorder. Urologist advised her to give an example of her urine to see if she does and told her to call her pcp. Can we do an sample without an appt? Please advise. EM

## 2021-05-19 NOTE — Telephone Encounter (Signed)
Pt notified of Dr. Marliss Coots comments. She will stop by today to leave urine sample

## 2021-05-20 LAB — URINE CULTURE
MICRO NUMBER:: 12053921
SPECIMEN QUALITY:: ADEQUATE

## 2021-05-23 ENCOUNTER — Ambulatory Visit: Payer: 59 | Admitting: Family Medicine

## 2021-09-10 ENCOUNTER — Telehealth: Payer: Self-pay | Admitting: *Deleted

## 2021-09-10 ENCOUNTER — Ambulatory Visit: Payer: 59 | Admitting: Family Medicine

## 2021-09-10 NOTE — Telephone Encounter (Signed)
PLEASE NOTE: All timestamps contained within this report are represented as [place] Standard Time. CONFIDENTIALTY NOTICE: This fax transmission is intended only for the addressee. It contains information that is legally privileged, confidential or otherwise protected from use or disclosure. If you are not the intended recipient, you are strictly prohibited from reviewing, disclosing, copying using or disseminating any of this information or taking any action in reliance on or regarding this information. If you have received this fax in error, please notify us immediately by telephone so that we can arrange for its return to [place]. Phone: [phone], Toll-Free: [phone], Fax: [phone] Page: 1 of 1 Call Id: [id number] [facility] - Client Nonclinical Telephone Record  [facility] Client [place] [facility] - Client Client Site [facility], [place] MD Contact Type Call Who Is Calling Patient / Member / Family / Caregiver Caller Name Oneisha Ammons Caller Phone Number [phone] Patient Name Pamela Calhoun Patient DOB 1986-09-25 Call Type Message Only Information Provided Reason for Call Request to [facility] Appointment Initial Comment Caller states, needs to cancel appt. for today at 930. Has a family emergency. Patient request to speak to RN No Additional Comment Caller states, will call back to reschedule. Disp. Time Disposition Final User 09/10/2021 7:36:27 AM General Information Provided Yes Jobie Quaker Call Closed By: Jobie Quaker Transaction Date/Time: 09/10/2021 7:34:29 AM (ET)

## 2021-09-10 NOTE — Telephone Encounter (Signed)
Pt called back and r/s appt till tomorrow

## 2021-09-11 ENCOUNTER — Ambulatory Visit: Payer: 59 | Admitting: Family Medicine

## 2021-11-21 ENCOUNTER — Encounter: Payer: Self-pay | Admitting: Family

## 2021-11-21 ENCOUNTER — Other Ambulatory Visit: Payer: Self-pay

## 2021-11-21 ENCOUNTER — Telehealth (INDEPENDENT_AMBULATORY_CARE_PROVIDER_SITE_OTHER): Payer: 59 | Admitting: Family

## 2021-11-21 VITALS — Ht 70.0 in | Wt 170.0 lb

## 2021-11-21 DIAGNOSIS — J4521 Mild intermittent asthma with (acute) exacerbation: Secondary | ICD-10-CM

## 2021-11-21 DIAGNOSIS — J0101 Acute recurrent maxillary sinusitis: Secondary | ICD-10-CM | POA: Insufficient documentation

## 2021-11-21 DIAGNOSIS — J45909 Unspecified asthma, uncomplicated: Secondary | ICD-10-CM | POA: Insufficient documentation

## 2021-11-21 MED ORDER — AMOXICILLIN-POT CLAVULANATE 875-125 MG PO TABS: 1.0000 | ORAL_TABLET | Freq: Two times a day (BID) | ORAL | 0 refills | Status: DC

## 2021-11-21 MED ORDER — ALBUTEROL SULFATE HFA 108 (90 BASE) MCG/ACT IN AERS: 2.0000 | INHALATION_SPRAY | RESPIRATORY_TRACT | 0 refills | Status: AC | PRN

## 2021-11-21 MED ORDER — METHYLPREDNISOLONE 4 MG PO TBPK: ORAL_TABLET | ORAL | 0 refills | Status: DC

## 2021-11-21 NOTE — Progress Notes (Signed)
MyChart Video Visit    Virtual Visit via Video Note   This visit type was conducted due to national recommendations for restrictions regarding the COVID-19 Pandemic (e.g. social distancing) in an effort to limit this patient's exposure and mitigate transmission in our community. This patient is at least at moderate risk for complications without adequate follow up. This format is felt to be most appropriate for this patient at this time. Physical exam was limited by quality of the video and audio technology used for the visit. CMA was able to get the patient set up on a video visit.  Patient location: Home. Patient and provider in visit Provider location: Office  I discussed the limitations of evaluation and management by telemedicine and the availability of in person appointments. The patient expressed understanding and agreed to proceed.  Visit Date: 11/21/2021  Today's healthcare provider: Eugenia Pancoast, FNP     Subjective:    Patient ID: Pamela Calhoun, female    DOB: 04/20/1986, 35 y.o.   MRN: [id number]  Chief Complaint  Patient presents with   Facial Pain   Sinus Problem   Nasal Congestion    Sinus Problem Associated symptoms include chills, congestion, coughing (wet productive), shortness of breath (mild) and a sore throat. Pertinent negatives include no ear pain.   35 y/o female here with c/o 2 week h/o sinus pressure and pain, headache, sore throat. Fever low grade 100.8 F on and off again, last fever was this am. Taking otc nyquil cold and flu as well as mucinex and alka seltzer cold plus. Alternating them. Also taking flonase daily. Is using her albuterol inhaler once daily, over the last three days as with some mild sob. Has a wet productive cough without chest congestion.   Tested negative for covid, she tested initially two weeks, 7 days ago and two days ago. All negative.   Past Medical History:  Diagnosis Date   Allergy    Cancer ([place])    skin- hystory  of melanoma    Depression    Endometriosis    Fracture    right foot   Frequent UTI    History of shingles    Interstitial cystitis    Pap smear, abnormal    cryosx    PID (acute pelvic inflammatory disease)    hx of   Ureteral reflux    as a child     Past Surgical History:  Procedure Laterality Date   DILATION AND CURETTAGE OF UTERUS     EXCISION OF SKIN TAG N/A 02/11/2016   Procedure: EXCISION OF ANAL SKIN TAG AN INTERNAL PAPILLA ;  Surgeon: Leighton Ruff, MD;  Location: [facility];  Service: General;  Laterality: N/A;   LAPAROSCOPY     pelvic    MELANOMA EXCISION     Right leg    TONSILLECTOMY      Family History  Problem Relation Age of Onset   Cancer Maternal Grandmother        breast   Diabetes Mother    Hypertension Mother    Neuropathy Mother        diabetic   Depression Mother    Asthma Father     Social History   Socioeconomic History   Marital status: Single    Spouse name: Not on file   Number of children: Not on file   Years of education: Not on file   Highest education level: Not on file  Occupational History  Not on file  Tobacco Use   Smoking status: Former    Types: Cigarettes    Quit date: 02/04/2012    Years since quitting: 9.8   Smokeless tobacco: Never  Vaping Use   Vaping Use: Never used  Substance and Sexual Activity   Alcohol use: Yes    Alcohol/week: 1.0 standard drink    Types: 1 Standard drinks or equivalent per week    Comment: occasionally   Drug use: No   Sexual activity: Not on file  Other Topics Concern   Not on file  Social History Narrative   Not on file   Social Determinants of Health   Financial Resource Strain: Not on file  Food Insecurity: Not on file  Transportation Needs: Not on file  Physical Activity: Not on file  Stress: Not on file  Social Connections: Not on file  Intimate Partner Violence: Not on file    Outpatient Medications Prior to Visit  Medication Sig Dispense Refill    ALPRAZolam (XANAX) 1 MG tablet   0   baclofen (LIORESAL) 10 MG tablet Take 10 mg by mouth 3 (three) times daily as needed.     ELMIRON 100 MG capsule Take 2 tablets by mouth at bedtime.   11   fluticasone (FLONASE) 50 MCG/ACT nasal spray Place 1 spray into both nostrils in the morning and at bedtime for 7 days. 1 g 0   hydrOXYzine (ATARAX/VISTARIL) 25 MG tablet Take 1 tablet (25 mg total) every 6 (six) hours by mouth. 12 tablet 0   norethindrone-ethinyl estradiol (MICROGESTIN,JUNEL,LOESTRIN) 1-20 MG-MCG tablet   11   Spacer/Aero-Holding Chambers (AEROCHAMBER PLUS) inhaler Use with inhaler 1 each 2   albuterol (VENTOLIN HFA) 108 (90 Base) MCG/ACT inhaler Inhale 2 puffs into the lungs every 4 (four) hours as needed for wheezing. 18 g 0   amoxicillin-clavulanate (AUGMENTIN) 875-125 MG tablet Take 1 tablet by mouth 2 (two) times daily. 14 tablet 0   ibuprofen (ADVIL) 600 MG tablet Take 1 tablet (600 mg total) by mouth every 6 (six) hours as needed. 30 tablet 0   ondansetron (ZOFRAN-ODT) 8 MG disintegrating tablet Take 1 tablet (8 mg total) by mouth every 8 (eight) hours as needed for nausea or vomiting. 20 tablet 0   No facility-administered medications prior to visit.    Allergies  Allergen Reactions   Cetirizine Diarrhea    REACTION: diarrhea    Cetirizine Hcl     REACTION: diarrhea   Hydrocodone-Acetaminophen Itching   Vicodin [Hydrocodone-Acetaminophen] Itching    Review of Systems  Constitutional:  Positive for chills and fever.  HENT:  Positive for congestion, sinus pain (frontal and maxillary) and sore throat. Negative for ear pain.   Respiratory:  Positive for cough (wet productive), sputum production and shortness of breath (mild). Negative for stridor.   Cardiovascular:  Negative for chest pain.  All other systems reviewed and are negative.     Objective:    Physical Exam Constitutional:      General: She is not in acute distress.    Appearance: Normal appearance. She  is ill-appearing. She is not toxic-appearing or diaphoretic.  HENT:     Head: Normocephalic.  Pulmonary:     Effort: Pulmonary effort is normal.  Neurological:     General: No focal deficit present.     Mental Status: She is alert and oriented to person, place, and time.  Psychiatric:        Mood and Affect: Mood normal.  Behavior: Behavior normal.        Thought Content: Thought content normal.        Judgment: Judgment normal.    Ht 5\' 10"  (1.778 m)    Wt 170 lb (77.1 kg)    BMI 24.39 kg/m  Wt Readings from Last 3 Encounters:  11/21/21 170 lb (77.1 kg)  11/05/20 170 lb (77.1 kg)  10/24/20 175 lb (79.4 kg)       Assessment & Plan:   Problem List Items Addressed This Visit       Respiratory   Extrinsic asthma    Albuterol inhaler refilled as hers was outdated.  Steroid pack also prescribed for mild shortness of breath.  Advised patient if any increasing or worsening shortness of breath to immediately go to the emergency room and or call 911 continue Flonase      Relevant Medications   albuterol (VENTOLIN HFA) 108 (90 Base) MCG/ACT inhaler   methylPREDNISolone (MEDROL DOSEPAK) 4 MG TBPK tablet   Acute recurrent maxillary sinusitis - Primary    Augmentin 875/125 mg p.o. twice daily for 10 days sent to the pharmacy.  Also sent Medrol Dosepak for sinus pain.  Take antibiotic as prescribed. Increase oral fluids. Pt to f/u if sx worsen and or fail to improve in 2-3 days.  Patient to follow-up with ear nose and throat if no improvement as this has been recurrent in the past.       Relevant Medications   amoxicillin-clavulanate (AUGMENTIN) 875-125 MG tablet   methylPREDNISolone (MEDROL DOSEPAK) 4 MG TBPK tablet    I have discontinued Jamyra L. Legendre's ibuprofen, ondansetron, and amoxicillin-clavulanate. I am also having her start on amoxicillin-clavulanate and methylPREDNISolone. Additionally, I am having her maintain her El[place]iron, hydrOXYzine, ALPRAZolam,  norethindrone-ethinyl estradiol, fluticasone, AeroChamber Plus, baclofen, and albuterol.  Meds ordered this encounter  Medications   albuterol (VENTOLIN HFA) 108 (90 Base) MCG/ACT inhaler    Sig: Inhale 2 puffs into the lungs every 4 (four) hours as needed for wheezing.    Dispense:  18 g    Refill:  0    Order Specific Question:   Supervising Provider    Answer:   BEDSOLE, AMY E [[id number]]   amoxicillin-clavulanate (AUGMENTIN) 875-125 MG tablet    Sig: Take 1 tablet by mouth 2 (two) times daily.    Dispense:  20 tablet    Refill:  0    Order Specific Question:   Supervising Provider    Answer:   BEDSOLE, AMY E [[id number]]   methylPREDNISolone (MEDROL DOSEPAK) 4 MG TBPK tablet    Sig: Take per package instructions    Dispense:  21 tablet    Refill:  0    Order Specific Question:   Supervising Provider    Answer:   BEDSOLE, AMY E [[id number]]    I discussed the assessment and treatment plan with the patient. The patient was provided an opportunity to ask questions and all were answered. The patient agreed with the plan and demonstrated an understanding of the instructions.   The patient was advised to call back or seek an in-person evaluation if the symptoms worsen or if the condition fails to improve as anticipated.  I provided 18 minutes of face-to-face time during this encounter.   Eugenia Pancoast, [facility] [phone] (phone) [phone] (fax)  [place]

## 2021-11-21 NOTE — Assessment & Plan Note (Signed)
Augmentin 875/125 mg p.o. twice daily for 10 days sent to the pharmacy.  Also sent Medrol Dosepak for sinus pain.  Take antibiotic as prescribed. Increase oral fluids. Pt to f/u if sx worsen and or fail to improve in 2-3 days.  Patient to follow-up with ear nose and throat if no improvement as this has been recurrent in the past.

## 2021-11-21 NOTE — Assessment & Plan Note (Signed)
Albuterol inhaler refilled as hers was outdated.  Steroid pack also prescribed for mild shortness of breath.  Advised patient if any increasing or worsening shortness of breath to immediately go to the emergency room and or call 911 continue Flonase

## 2021-11-21 NOTE — Patient Instructions (Signed)
Antibiotic sent to preferred pharmacy.   Please increase oral fluids, steamy hot shower/humidifier prn.  Please follow up if no improvement in 2-3 days.   It was a pleasure seeing you today! Please do not hesitate to reach out with any questions and or concerns.  Regards,   Haden Suder   

## 2022-01-20 ENCOUNTER — Other Ambulatory Visit: Payer: Medicaid Other

## 2022-01-23 ENCOUNTER — Other Ambulatory Visit: Payer: Medicaid Other

## 2022-01-26 ENCOUNTER — Other Ambulatory Visit: Payer: Medicaid Other

## 2022-02-19 ENCOUNTER — Ambulatory Visit (INDEPENDENT_AMBULATORY_CARE_PROVIDER_SITE_OTHER): Payer: Self-pay | Admitting: Family Medicine

## 2022-02-19 ENCOUNTER — Encounter: Payer: Self-pay | Admitting: Family Medicine

## 2022-02-19 VITALS — BP 118/70 | HR 88 | Temp 97.7°F | Ht 70.0 in | Wt 167.5 lb

## 2022-02-19 DIAGNOSIS — J209 Acute bronchitis, unspecified: Secondary | ICD-10-CM

## 2022-02-19 MED ORDER — BENZONATATE 200 MG PO CAPS: 200.0000 mg | ORAL_CAPSULE | Freq: Three times a day (TID) | ORAL | 1 refills | Status: DC | PRN

## 2022-02-19 MED ORDER — PREDNISONE 10 MG PO TABS: ORAL_TABLET | ORAL | 0 refills | Status: DC

## 2022-02-19 MED ORDER — AMOXICILLIN-POT CLAVULANATE 875-125 MG PO TABS: 1.0000 | ORAL_TABLET | Freq: Two times a day (BID) | ORAL | 0 refills | Status: DC

## 2022-02-19 NOTE — Assessment & Plan Note (Addendum)
3 weeks into viral uri with wheezing/prod cough ?Also early sinusitis  ?Disc symptom care  ?Px augmentin, prednisone taper (rev side eff) and tessalon ?Update if not starting to improve in a week or if worsening  ?ER precautions reviewed  ?Handout given  ? ?Meds ordered this encounter  ?Medications  ?? predniSONE (DELTASONE) 10 MG tablet  ?  Sig: Take 4 pills once daily by mouth for 3 days, then 3 pills daily for 3 days, then 2 pills daily for 3 days then 1 pill daily for 3 days then stop  ?  Dispense:  30 tablet  ?  Refill:  0  ?? amoxicillin-clavulanate (AUGMENTIN) 875-125 MG tablet  ?  Sig: Take 1 tablet by mouth 2 (two) times daily.  ?  Dispense:  14 tablet  ?  Refill:  0  ?? benzonatate (TESSALON) 200 MG capsule  ?  Sig: Take 1 capsule (200 mg total) by mouth 3 (three) times daily as needed for cough. Swallow whole  ?  Dispense:  30 capsule  ?  Refill:  1  ? ? ?

## 2022-02-19 NOTE — Patient Instructions (Signed)
Drink fluids and rest ? ?mucinex DM is good for cough and congestion  ?Nasal saline for congestion as needed  ?Tylenol for fever or pain or headache  ?Please alert [place] if symptoms worsen (if severe or short of breath please go to the ER)  ? ?Tessalon for additional cough control  ?Prednisone as directed-will make you hyper and hungry ?Augmentin- antibiotic ? ?Update if not starting to improve in a week or if worsening  ? ?

## 2022-02-19 NOTE — Progress Notes (Addendum)
? ?Subjective:  ? ? Patient ID: Pamela Calhoun, female    DOB: Sep 11, 1986, 36 y.o.   MRN: [id number] ? ?This visit occurred during the SARS-CoV-2 public health emergency.  Safety protocols were in place, including screening questions prior to the visit, additional usage of staff PPE, and extensive cleaning of exam room while observing appropriate contact time as indicated for disinfecting solutions.  ? ?HPI ?Pt presents for 3 weeks of cough ? ?Wt Readings from Last 3 Encounters:  ?02/19/22 167 lb 8 oz (76 kg)  ?11/21/21 170 lb (77.1 kg)  ?11/05/20 170 lb (77.1 kg)  ? ?24.03 kg/m? ? ?Had a head cold  ?Cough continued to get worse  ?Now prod of green phlegm  ?Nasal congestion but no pain ?Some elevated temp / low grade  ? ?Some wheezing  ?Sob with exertion  ?Using her inhaler -albuterol  ? ?Sore throat ?Ears hurt  ? ?Otc: ?Alkaselzer cough/cold ?Nyquil ?Mucinex DM  ? ?Flonase  ? ?Patient Active Problem List  ? Diagnosis Date Noted  ? Extrinsic asthma 11/21/2021  ? Acute recurrent maxillary sinusitis 11/21/2021  ? Otalgia of both ears 07/03/2020  ? Benign paroxysmal positional vertigo 07/03/2020  ? Hydradenitis 10/26/2017  ? Screen for STD (sexually transmitted disease) 01/23/2015  ? Acute bronchitis 12/24/2014  ? MRSA (methicillin resistant Staphylococcus aureus) carrier 04/12/2014  ? INTERSTITIAL CYSTITIS 06/25/2010  ? Allergic rhinitis 03/22/2007  ? SKIN CANCER, HX OF 03/22/2007  ? ?Past Medical History:  ?Diagnosis Date  ? Allergy   ? Cancer [facility])   ? skin- hystory of melanoma   ? Depression   ? Endometriosis   ? Fracture   ? right foot  ? Frequent UTI   ? History of shingles   ? Interstitial cystitis   ? Pap smear, abnormal   ? cryosx   ? PID (acute pelvic inflammatory disease)   ? hx of  ? Ureteral reflux   ? as a child   ? ?Past Surgical History:  ?Procedure Laterality Date  ? DILATION AND CURETTAGE OF UTERUS    ? EXCISION OF SKIN TAG N/A 02/11/2016  ? Procedure: EXCISION OF ANAL SKIN TAG AN INTERNAL PAPILLA ;   Surgeon: Leighton Ruff, MD;  Location: [facility];  Service: General;  Laterality: N/A;  ? LAPAROSCOPY    ? pelvic   ? MELANOMA EXCISION    ? Right leg   ? TONSILLECTOMY    ? ?Social History  ? ?Tobacco Use  ? Smoking status: Former  ?  Types: Cigarettes  ?  Quit date: 02/04/2012  ?  Years since quitting: 10.0  ? Smokeless tobacco: Never  ?Vaping Use  ? Vaping Use: Never used  ?Substance Use Topics  ? Alcohol use: Yes  ?  Alcohol/week: 1.0 standard drink  ?  Types: 1 Standard drinks or equivalent per week  ?  Comment: occasionally  ? Drug use: No  ? ?Family History  ?Problem Relation Age of Onset  ? Cancer Maternal Grandmother   ?     breast  ? Diabetes Mother   ? Hypertension Mother   ? Neuropathy Mother   ?     diabetic  ? Depression Mother   ? Asthma Father   ? ?Allergies  ?Allergen Reactions  ? Cetirizine Diarrhea  ?  REACTION: diarrhea ?  ? Cetirizine Hcl   ?  REACTION: diarrhea  ? Hydrocodone-Acetaminophen Itching  ? Vicodin [Hydrocodone-Acetaminophen] Itching  ? ?Current Outpatient Medications on File Prior to Visit  ?  Medication Sig Dispense Refill  ? albuterol (VENTOLIN HFA) 108 (90 Base) MCG/ACT inhaler Inhale 2 puffs into the lungs every 4 (four) hours as needed for wheezing. 18 g 0  ? ALPRAZolam (XANAX) 1 MG tablet   0  ? baclofen (LIORESAL) 10 MG tablet Take 10 mg by mouth 3 (three) times daily as needed.    ? ELMIRON 100 MG capsule Take 2 tablets by mouth at bedtime.   11  ? hydrOXYzine (ATARAX/VISTARIL) 25 MG tablet Take 1 tablet (25 mg total) every 6 (six) hours by mouth. 12 tablet 0  ? norethindrone-ethinyl estradiol (MICROGESTIN,JUNEL,LOESTRIN) 1-20 MG-MCG tablet   11  ? Spacer/Aero-Holding Chambers (AEROCHAMBER PLUS) inhaler Use with inhaler 1 each 2  ? fluticasone (FLONASE) 50 MCG/ACT nasal spray Place 1 spray into both nostrils in the morning and at bedtime for 7 days. 1 g 0  ? [DISCONTINUED] dicyclomine (BENTYL) 20 MG tablet Take 1 tablet (20 mg total) by mouth 2 (two) times  daily. 20 tablet 0  ? ?No current facility-administered medications on file prior to visit.  ?  ?Review of Systems  ?Constitutional:  Positive for appetite change and fatigue. Negative for fever.  ?HENT:  Positive for congestion, postnasal drip, rhinorrhea, sinus pressure, sneezing and sore throat. Negative for ear pain.   ?Eyes:  Negative for pain and discharge.  ?Respiratory:  Positive for cough and wheezing. Negative for shortness of breath and stridor.   ?Cardiovascular:  Negative for chest pain.  ?Gastrointestinal:  Negative for diarrhea, nausea and vomiting.  ?Genitourinary:  Negative for frequency, hematuria and urgency.  ?Musculoskeletal:  Negative for arthralgias and myalgias.  ?Skin:  Negative for rash.  ?Neurological:  Positive for headaches. Negative for dizziness, weakness and light-headedness.  ?Psychiatric/Behavioral:  Negative for confusion and dysphoric mood.   ? ?   ?Objective:  ? Physical Exam ?Constitutional:   ?   General: She is not in acute distress. ?   Appearance: Normal appearance. She is well-developed. She is not ill-appearing.  ?HENT:  ?   Head: Normocephalic and atraumatic.  ?   Comments: Bilateral maxillary and frontal sinus tenderness ?   Right Ear: Tympanic membrane, ear canal and external ear normal.  ?   Left Ear: Tympanic membrane, ear canal and external ear normal.  ?   Nose: Congestion and rhinorrhea present.  ?   Mouth/Throat:  ?   Pharynx: Oropharynx is clear. No oropharyngeal exudate or posterior oropharyngeal erythema.  ?   Comments: Clear pnd ?Eyes:  ?   General:     ?   Right eye: No discharge.     ?   Left eye: No discharge.  ?   Conjunctiva/sclera: Conjunctivae normal.  ?   Pupils: Pupils are equal, round, and reactive to light.  ?Cardiovascular:  ?   Rate and Rhythm: Normal rate and regular rhythm.  ?Pulmonary:  ?   Effort: Pulmonary effort is normal. No respiratory distress.  ?   Breath sounds: Normal breath sounds. No wheezing or rales.  ?   Comments: Good air  exch ?No rales  ? ?Wheeze on forced expiration with rhonchi scattered  ? ?Musculoskeletal:  ?   Cervical back: Normal range of motion and neck supple.  ?Lymphadenopathy:  ?   Cervical: No cervical adenopathy.  ?Skin: ?   General: Skin is warm and dry.  ?   Findings: No rash.  ?Neurological:  ?   Mental Status: She is alert.  ?   Cranial Nerves: No cranial nerve  deficit.  ?   Coordination: Coordination normal.  ?Psychiatric:     ?   Mood and Affect: Mood normal.  ? ? ? ? ? ?   ?Assessment & Plan:  ? ?Problem List Items Addressed This Visit   ? ?  ? Respiratory  ? Acute bronchitis - Primary  ?  3 weeks into viral uri with wheezing/prod cough ?Also early sinusitis  ?Disc symptom care  ?Px augmentin, prednisone taper (rev side eff) and tessalon ?Update if not starting to improve in a week or if worsening  ?ER precautions reviewed  ?Handout given  ? ?Meds ordered this encounter  ?Medications  ? predniSONE (DELTASONE) 10 MG tablet  ?  Sig: Take 4 pills once daily by mouth for 3 days, then 3 pills daily for 3 days, then 2 pills daily for 3 days then 1 pill daily for 3 days then stop  ?  Dispense:  30 tablet  ?  Refill:  0  ? amoxicillin-clavulanate (AUGMENTIN) 875-125 MG tablet  ?  Sig: Take 1 tablet by mouth 2 (two) times daily.  ?  Dispense:  14 tablet  ?  Refill:  0  ? benzonatate (TESSALON) 200 MG capsule  ?  Sig: Take 1 capsule (200 mg total) by mouth 3 (three) times daily as needed for cough. Swallow whole  ?  Dispense:  30 capsule  ?  Refill:  1  ? ? ?  ?  ? ? ? ? ?

## 2022-04-07 ENCOUNTER — Ambulatory Visit (INDEPENDENT_AMBULATORY_CARE_PROVIDER_SITE_OTHER): Payer: Self-pay | Admitting: Family Medicine

## 2022-04-07 ENCOUNTER — Encounter: Payer: Self-pay | Admitting: Family Medicine

## 2022-04-07 VITALS — BP 116/78 | HR 74 | Temp 98.1°F

## 2022-04-07 DIAGNOSIS — J069 Acute upper respiratory infection, unspecified: Secondary | ICD-10-CM

## 2022-04-07 DIAGNOSIS — R6889 Other general symptoms and signs: Secondary | ICD-10-CM

## 2022-04-07 LAB — POC INFLUENZA A&B (BINAX/QUICKVUE)
Influenza A, POC: NEGATIVE
Influenza B, POC: NEGATIVE

## 2022-04-07 MED ORDER — PREDNISONE 10 MG PO TABS: ORAL_TABLET | ORAL | 0 refills | Status: DC

## 2022-04-07 MED ORDER — BENZONATATE 200 MG PO CAPS: 200.0000 mg | ORAL_CAPSULE | Freq: Three times a day (TID) | ORAL | 1 refills | Status: DC | PRN

## 2022-04-07 NOTE — Patient Instructions (Signed)
Drink fluids ?Rest  ?Treat your symptoms  ? ?Add tessalon for cough  ?Take prednisone as directed- to help congestion /sinus pain and chest symptoms ? ?Don't mix tylenol with multi symptom cold meds  ?Advil- take with food  ? ?Update if not starting to improve in a week or if worsening  ? ?  ? ?

## 2022-04-07 NOTE — Assessment & Plan Note (Signed)
Since Friday with fever ?Neg covid and flu tests  ?Exp to illness in childcare /classroom  ? ?Reassuring exam  ?Prednisone px for congestion/cough along with tessalon ?Disc poss side effects /hyper and hungry  ?Fluids/rest ?Symptom care ?ER precautions ?Will watch for s/s of bacterial sinusitis  ?

## 2022-04-07 NOTE — Progress Notes (Signed)
? ?Subjective:  ? ? Patient ID: Pamela Calhoun, female    DOB: 18-Apr-1986, 36 y.o.   MRN: [id number] ? ?HPI ?Pt presents for cough and congestion  ? ?Wt Readings from Last 3 Encounters:  ?02/19/22 167 lb 8 oz (76 kg)  ?11/21/21 170 lb (77.1 kg)  ?11/05/20 170 lb (77.1 kg)  ? ?  ?  ? ?Started last Friday  ?Fever/aches and chills - temp of 102 this weekend  ?ST- improving  ?Cough- with prod of clear to white mucous ? ?Nasal congestion- clear ?Head hurts at times   ?L maxillary facial pain  ? ?A little wheezing at night  ? ? ?Covid test was neg fri and Sunday  ? ?Exp to flu from classroom  ?15 kids out in 1 week (confirmed flu)  ? ?Neg flu test today  ? ?Some nausea  ?No vomiting  ? ?Otc: ?Mucinex ?Nyquil or alka selzer cold and flu  ?Tylenol occ ?Advil occ  ? ?Results for orders placed or performed in visit on 04/07/22  ?POC Influenza A&B(BINAX/QUICKVUE)  ?Result Value Ref Range  ? Influenza A, POC Negative Negative  ? Influenza B, POC Negative Negative  ? ? ? ?Patient Active Problem List  ? Diagnosis Date Noted  ? Extrinsic asthma 11/21/2021  ? Acute recurrent maxillary sinusitis 11/21/2021  ? Otalgia of both ears 07/03/2020  ? Benign paroxysmal positional vertigo 07/03/2020  ? Hydradenitis 10/26/2017  ? Viral URI with cough 01/15/2016  ? Screen for STD (sexually transmitted disease) 01/23/2015  ? Acute bronchitis 12/24/2014  ? MRSA (methicillin resistant Staphylococcus aureus) carrier 04/12/2014  ? INTERSTITIAL CYSTITIS 06/25/2010  ? Allergic rhinitis 03/22/2007  ? SKIN CANCER, HX OF 03/22/2007  ? ?Past Medical History:  ?Diagnosis Date  ? Allergy   ? Cancer [facility])   ? skin- hystory of melanoma   ? Depression   ? Endometriosis   ? Fracture   ? right foot  ? Frequent UTI   ? History of shingles   ? Interstitial cystitis   ? Pap smear, abnormal   ? cryosx   ? PID (acute pelvic inflammatory disease)   ? hx of  ? Ureteral reflux   ? as a child   ? ?Past Surgical History:  ?Procedure Laterality Date  ? DILATION AND  CURETTAGE OF UTERUS    ? EXCISION OF SKIN TAG N/A 02/11/2016  ? Procedure: EXCISION OF ANAL SKIN TAG AN INTERNAL PAPILLA ;  Surgeon: Leighton Ruff, MD;  Location: [facility];  Service: General;  Laterality: N/A;  ? LAPAROSCOPY    ? pelvic   ? MELANOMA EXCISION    ? Right leg   ? TONSILLECTOMY    ? ?Social History  ? ?Tobacco Use  ? Smoking status: Former  ?  Types: Cigarettes  ?  Quit date: 02/04/2012  ?  Years since quitting: 10.1  ? Smokeless tobacco: Never  ?Vaping Use  ? Vaping Use: Never used  ?Substance Use Topics  ? Alcohol use: Yes  ?  Alcohol/week: 1.0 standard drink  ?  Types: 1 Standard drinks or equivalent per week  ?  Comment: occasionally  ? Drug use: No  ? ?Family History  ?Problem Relation Age of Onset  ? Cancer Maternal Grandmother   ?     breast  ? Diabetes Mother   ? Hypertension Mother   ? Neuropathy Mother   ?     diabetic  ? Depression Mother   ? Asthma Father   ? ?  Allergies  ?Allergen Reactions  ? Cetirizine Diarrhea  ?  REACTION: diarrhea ?  ? Cetirizine Hcl   ?  REACTION: diarrhea  ? Hydrocodone-Acetaminophen Itching  ? Vicodin [Hydrocodone-Acetaminophen] Itching  ? ?Current Outpatient Medications on File Prior to Visit  ?Medication Sig Dispense Refill  ? albuterol (VENTOLIN HFA) 108 (90 Base) MCG/ACT inhaler Inhale 2 puffs into the lungs every 4 (four) hours as needed for wheezing. 18 g 0  ? ALPRAZolam (XANAX) 1 MG tablet   0  ? amoxicillin-clavulanate (AUGMENTIN) 875-125 MG tablet Take 1 tablet by mouth 2 (two) times daily. 14 tablet 0  ? baclofen (LIORESAL) 10 MG tablet Take 10 mg by mouth 3 (three) times daily as needed.    ? ELMIRON 100 MG capsule Take 2 tablets by mouth at bedtime.   11  ? hydrOXYzine (ATARAX/VISTARIL) 25 MG tablet Take 1 tablet (25 mg total) every 6 (six) hours by mouth. 12 tablet 0  ? norethindrone-ethinyl estradiol (MICROGESTIN,JUNEL,LOESTRIN) 1-20 MG-MCG tablet   11  ? Spacer/Aero-Holding Chambers (AEROCHAMBER PLUS) inhaler Use with inhaler 1 each 2   ? fluticasone (FLONASE) 50 MCG/ACT nasal spray Place 1 spray into both nostrils in the morning and at bedtime for 7 days. 1 g 0  ? [DISCONTINUED] dicyclomine (BENTYL) 20 MG tablet Take 1 tablet (20 mg total) by mouth 2 (two) times daily. 20 tablet 0  ? ?No current facility-administered medications on file prior to visit.  ?  ?Review of Systems  ?Constitutional:  Positive for appetite change, fatigue and fever. Negative for activity change and unexpected weight change.  ?HENT:  Positive for congestion, postnasal drip, sinus pressure, sinus pain and voice change. Negative for ear pain, rhinorrhea and sore throat.   ?Eyes:  Negative for pain, redness and visual disturbance.  ?Respiratory:  Positive for cough. Negative for shortness of breath, wheezing and stridor.   ?Cardiovascular:  Negative for chest pain and palpitations.  ?Gastrointestinal:  Positive for nausea. Negative for abdominal pain, blood in stool, constipation and diarrhea.  ?Endocrine: Negative for polydipsia and polyuria.  ?Genitourinary:  Negative for dysuria, frequency and urgency.  ?Musculoskeletal:  Negative for arthralgias, back pain and myalgias.  ?Skin:  Negative for pallor and rash.  ?Allergic/Immunologic: Negative for environmental allergies.  ?Neurological:  Negative for dizziness, syncope and headaches.  ?Hematological:  Negative for adenopathy. Does not bruise/bleed easily.  ?Psychiatric/Behavioral:  Negative for decreased concentration and dysphoric mood. The patient is not nervous/anxious.   ? ?   ?Objective:  ? Physical Exam ?Constitutional:   ?   General: She is not in acute distress. ?   Appearance: Normal appearance. She is well-developed. She is obese. She is not ill-appearing, toxic-appearing or diaphoretic.  ?HENT:  ?   Head: Normocephalic and atraumatic.  ?   Comments: Nares are injected and congested   ? ?Mild L maxillary sinus tenderness and frontal  (more pressure than pain)  ?No facial swelling  ?   Right Ear: Tympanic  membrane, ear canal and external ear normal.  ?   Left Ear: Tympanic membrane, ear canal and external ear normal.  ?   Nose: Congestion and rhinorrhea present.  ?   Mouth/Throat:  ?   Mouth: Mucous membranes are moist.  ?   Pharynx: Oropharynx is clear. No oropharyngeal exudate or posterior oropharyngeal erythema.  ?   Comments: Clear pnd  ?Eyes:  ?   General:     ?   Right eye: No discharge.     ?  Left eye: No discharge.  ?   Conjunctiva/sclera: Conjunctivae normal.  ?   Pupils: Pupils are equal, round, and reactive to light.  ?Cardiovascular:  ?   Rate and Rhythm: Normal rate.  ?   Heart sounds: Normal heart sounds.  ?Pulmonary:  ?   Effort: Pulmonary effort is normal. No respiratory distress.  ?   Breath sounds: No stridor. Wheezing present. No rhonchi or rales.  ?   Comments: Good air exch ? ?Scant end exp wheeze scattered  ?Chest:  ?   Chest wall: No tenderness.  ?Musculoskeletal:  ?   Cervical back: Normal range of motion and neck supple.  ?Lymphadenopathy:  ?   Cervical: No cervical adenopathy.  ?Skin: ?   General: Skin is warm and dry.  ?   Capillary Refill: Capillary refill takes less than 2 seconds.  ?   Findings: No rash.  ?Neurological:  ?   Mental Status: She is alert.  ?   Cranial Nerves: No cranial nerve deficit.  ?Psychiatric:     ?   Mood and Affect: Mood normal.  ? ? ? ? ? ?   ?Assessment & Plan:  ? ?Problem List Items Addressed This Visit   ? ?  ? Respiratory  ? Viral URI with cough  ?  Since Friday with fever ?Neg covid and flu tests  ?Exp to illness in childcare /classroom  ? ?Reassuring exam  ?Prednisone px for congestion/cough along with tessalon ?Disc poss side effects /hyper and hungry  ?Fluids/rest ?Symptom care ?ER precautions ?Will watch for s/s of bacterial sinusitis  ? ?  ?  ? ?Other Visit Diagnoses   ? ? Flu-like symptoms    -  Primary  ? Relevant Orders  ? POC Influenza A&B(BINAX/QUICKVUE) (Completed)  ? ?  ? ? ? ?

## 2022-05-21 ENCOUNTER — Telehealth: Payer: Self-pay

## 2022-05-21 NOTE — Telephone Encounter (Signed)
I spoke with pt and since vision is blurry pt is waiting on a friend to pick her up and take her to [place] in [place]. Sending note to Dr Glori Bickers and [place] CMA.Marland Kitchen

## 2022-05-21 NOTE — Telephone Encounter (Signed)
Aware, will watch for correspondence  

## 2022-05-21 NOTE — Telephone Encounter (Signed)
Patient called in stating she believed she was having an allergic reaction to some eye drops. Used some eye drops and since then her vision is blurry, eyes are red and painful to the touch. Patient is currently speaking with access nurse.

## 2022-05-21 NOTE — Telephone Encounter (Signed)
[facility] - Client TELEPHONE ADVICE RECORD Access[facility]urse Patient Name: Pamela Calhoun Gender: Female DOB: 02-17-1986 Age: 36 Y 11 M 28 D Return Phone Number: [phone] (Primary) Address: City/ State/ Zip: [place]  [address] Client [facility] - Client Client Site [facility] Provider Glori Bickers, Roque Lias - MD Contact Type Call Who Is Calling Patient / Member / Family / Caregiver Call Type Triage / Clinical Relationship To Patient Self Return Phone Number [phone] (Primary) Chief Complaint VISION - sudden loss or sudden decrease (NOT blurred vision) Reason for Call Symptomatic / Request for Health Information Initial Comment Office transferred caller. Caller states she used some eye drops yesterday that have caused her eyes to be red, hazy, and painful today. She also has a yellow discharge coming out of both of her eyes. Translation No Nurse Assessment Nurse: Raenette Rover, RN, Zella Ball Date/Time (Eastern Time): 05/21/2022 11:51:58 AM Confirm and document reason for call. If symptomatic, describe symptoms. ---Office transferred caller. Caller states she used some eye drops yesterday that have caused her eyes to be red, hazy, and painful today. She also has a yellow discharge coming out of both of her eyes. Eyes were already like that but the drops made it worse. Has been using drops for the last couple of weeks. No longer has the drops threw away. Does the patient have any new or worsening symptoms? ---Yes Will a triage be completed? ---Yes Related visit to physician within the last 2 weeks? ---No Does the PT have any chronic conditions? (i.e. diabetes, asthma, this includes High risk factors for pregnancy, etc.) ---No Is the patient pregnant or possibly pregnant? (Ask all females between the ages of 21-55) ---No Is this a behavioral health or substance abuse call?  ---No Guidelines Guideline Title Affirmed Question Affirmed Notes Nurse Date/Time Eilene Ghazi Time) Eye - Pus or Discharge SEVERE eye pain (e.g., excruciating) Raenette Rover, RN, Zella Ball 05/21/2022 11:54:06 AM PLEASE NOTE: All timestamps contained within this report are represented as [place] Standard Time. CONFIDENTIALTY NOTICE: This fax transmission is intended only for the addressee. It contains information that is legally privileged, confidential or otherwise protected from use or disclosure. If you are not the intended recipient, you are strictly prohibited from reviewing, disclosing, copying using or disseminating any of this information or taking any action in reliance on or regarding this information. If you have received this fax in error, please notify us immediately by telephone so that we can arrange for its return to [place]. Phone: [phone], Toll-Free: [phone], Fax: [phone] Page: 2 of 2 Call Id: [id number] [place]. Time Eilene Ghazi Time) Disposition Final User 05/21/2022 11:50:41 AM Send to Urgent Queue Jaynie Crumble 05/21/2022 11:54:31 AM Go to ED Now Yes Raenette Rover, RN, Zella Ball Final Disposition 05/21/2022 11:54:31 AM Go to ED Now Yes Raenette Rover, RN, Herbert Deaner Disagree/Comply Comply Caller Understands Yes PreDisposition InappropriateToAsk Care Advice Given Per Guideline GO TO ED NOW: * Leave now. Drive carefully. ANOTHER ADULT SHOULD DRIVE: CARE ADVICE given per Eye - Pus or Discharge (Adult) guideline. Comments User: Wilson Singer, RN Date/Time Eilene Ghazi Time): 05/21/2022 11:53:42 AM Feels gritty and almost swollen shut and hot to the touch. [place]

## 2022-11-18 ENCOUNTER — Inpatient Hospital Stay (HOSPITAL_COMMUNITY)
Admission: AD | Admit: 2022-11-18 | Discharge: 2022-11-19 | Disposition: A | Discharge status: Home / Self Care | Payer: Medicaid Other | Attending: Family Medicine | Admitting: Family Medicine

## 2022-11-18 ENCOUNTER — Inpatient Hospital Stay (HOSPITAL_COMMUNITY): Payer: Medicaid Other

## 2022-11-18 ENCOUNTER — Encounter (HOSPITAL_COMMUNITY): Payer: Self-pay

## 2022-11-18 DIAGNOSIS — O09521 Supervision of elderly multigravida, first trimester: Secondary | ICD-10-CM | POA: Diagnosis not present

## 2022-11-18 DIAGNOSIS — R102 Pelvic and perineal pain: Secondary | ICD-10-CM | POA: Diagnosis not present

## 2022-11-18 DIAGNOSIS — Z3A09 9 weeks gestation of pregnancy: Secondary | ICD-10-CM

## 2022-11-18 DIAGNOSIS — O98511 Other viral diseases complicating pregnancy, first trimester: Secondary | ICD-10-CM | POA: Insufficient documentation

## 2022-11-18 DIAGNOSIS — O209 Hemorrhage in early pregnancy, unspecified: Secondary | ICD-10-CM | POA: Diagnosis not present

## 2022-11-18 DIAGNOSIS — O26891 Other specified pregnancy related conditions, first trimester: Secondary | ICD-10-CM | POA: Diagnosis not present

## 2022-11-18 DIAGNOSIS — O99341 Other mental disorders complicating pregnancy, first trimester: Secondary | ICD-10-CM | POA: Diagnosis not present

## 2022-11-18 DIAGNOSIS — O3680X0 Pregnancy with inconclusive fetal viability, not applicable or unspecified: Secondary | ICD-10-CM

## 2022-11-18 LAB — URINALYSIS, ROUTINE W REFLEX MICROSCOPIC
Bacteria, UA: NONE SEEN
Bilirubin Urine: NEGATIVE
Glucose, UA: NEGATIVE mg/dL
Ketones, ur: 20 mg/dL — AB
Leukocytes,Ua: NEGATIVE
Nitrite: NEGATIVE
Protein, ur: NEGATIVE mg/dL
Specific Gravity, Urine: 1.017 (ref 1.005–1.030)
pH: 5 (ref 5.0–8.0)

## 2022-11-18 LAB — BASIC METABOLIC PANEL
Anion gap: 7 (ref 5–15)
BUN: 7 mg/dL (ref 6–20)
CO2: 25 mmol/L (ref 22–32)
Calcium: 9.6 mg/dL (ref 8.9–10.3)
Chloride: 106 mmol/L (ref 98–111)
Creatinine, Ser: 0.76 mg/dL (ref 0.44–1.00)
GFR, Estimated: >60 mL/min (ref >60)
Glucose, Bld: 96 mg/dL (ref 70–99)
Potassium: 4.5 mmol/L (ref 3.5–5.1)
Sodium: 138 mmol/L (ref 135–145)

## 2022-11-18 LAB — CBC
HCT: 39 % (ref 36.0–46.0)
Hemoglobin: 13.1 g/dL (ref 12.0–15.0)
MCH: 30.5 pg (ref 26.0–34.0)
MCHC: 33.6 g/dL (ref 30.0–36.0)
MCV: 90.7 fL (ref 80.0–100.0)
Platelets: 305 10*3/uL (ref 150–400)
RBC: 4.3 MIL/uL (ref 3.87–5.11)
RDW: 13.5 % (ref 11.5–15.5)
WBC: 9.2 10*3/uL (ref 4.0–10.5)
nRBC: 0 % (ref 0.0–0.2)

## 2022-11-18 LAB — HCG, QUANTITATIVE, PREGNANCY: hCG, Beta Chain, Quant, S: 9626 m[IU]/mL — ABNORMAL HIGH (ref <5)

## 2022-11-18 LAB — POCT PREGNANCY, URINE: Preg Test, Ur: POSITIVE — AB

## 2022-11-18 NOTE — MAU Note (Signed)
..  Pamela Calhoun is a 36 y.o. at 75w6dhere in MAU reporting: vaginal bleeding that began today was bright red at first and is now dark red and mainly when she wipes none on her pad.  Reports abdominal cramping that has gotten worse today. Took 1,'000mg'$  of tylenol around 12pm, did not help  LMP: 09/17/2022  Pain score: 3/10 Vitals:   11/18/22 2133  BP: 122/68  Pulse: 86  Temp: 98.2 F (36.8 C)  SpO2: 100%     FHT:n/a Lab orders placed from triage:  pregnancy test

## 2022-11-19 ENCOUNTER — Telehealth (HOSPITAL_COMMUNITY): Payer: Self-pay

## 2022-11-19 ENCOUNTER — Other Ambulatory Visit: Payer: Self-pay | Admitting: Advanced Practice Midwife

## 2022-11-19 DIAGNOSIS — R102 Pelvic and perineal pain: Secondary | ICD-10-CM

## 2022-11-19 DIAGNOSIS — O26891 Other specified pregnancy related conditions, first trimester: Secondary | ICD-10-CM

## 2022-11-19 DIAGNOSIS — Z3A09 9 weeks gestation of pregnancy: Secondary | ICD-10-CM

## 2022-11-19 DIAGNOSIS — O3680X0 Pregnancy with inconclusive fetal viability, not applicable or unspecified: Secondary | ICD-10-CM

## 2022-11-19 DIAGNOSIS — O209 Hemorrhage in early pregnancy, unspecified: Secondary | ICD-10-CM

## 2022-11-19 LAB — GC/CHLAMYDIA PROBE AMP (~~LOC~~) NOT AT ARMC
Chlamydia: POSITIVE — AB
Comment: NEGATIVE
Comment: NORMAL
Neisseria Gonorrhea: NEGATIVE

## 2022-11-19 LAB — WET PREP, GENITAL
Clue Cells Wet Prep HPF POC: NONE SEEN
Sperm: NONE SEEN
Trich, Wet Prep: NONE SEEN
WBC, Wet Prep HPF POC: <10 (ref <10)
Yeast Wet Prep HPF POC: NONE SEEN

## 2022-11-19 LAB — HIV ANTIBODY (ROUTINE TESTING W REFLEX): HIV Screen 4th Generation wRfx: NONREACTIVE

## 2022-11-19 MED ORDER — AZITHROMYCIN 500 MG PO TABS: 1000.0000 mg | ORAL_TABLET | Freq: Once | ORAL | 0 refills | Status: AC

## 2022-11-19 NOTE — MAU Provider Note (Signed)
Chief Complaint: No chief complaint on file.   Event Date/Time   First Provider Initiated Contact with Patient 11/19/22 0017        SUBJECTIVE HPI: Pamela Calhoun is a 36 y.o. G3P0020 at 59w0dby LMP who presents to maternity admissions reporting vaginal bleeding today.  Also having pelvic cramping. . She denies vaginal itching/burning, urinary symptoms, h/a, dizziness, n/v, or fever/chills.    Vaginal Bleeding The patient's primary symptoms include pelvic pain and vaginal bleeding. The patient's pertinent negatives include no genital odor. The current episode started today. The pain is moderate. She is pregnant. Pertinent negatives include no chills, diarrhea, dysuria, fever or headaches. The vaginal discharge was bloody. The vaginal bleeding is lighter than menses. She has not been passing clots. She has not been passing tissue. Nothing aggravates the symptoms. She has tried nothing for the symptoms.   RN Note: Pamela ROLDANis a 36y.o. at 831w6dere in MAU reporting: vaginal bleeding that began today was bright red at first and is now dark red and mainly when she wipes none on her pad.  Reports abdominal cramping that has gotten worse today. Took 1,'000mg'$  of tylenol around 12pm, did not help   Past Medical History:  Diagnosis Date   Allergy    [place]   skin- hystory of melanoma    Depression    Endometriosis    Fracture    right foot   Frequent UTI    History of shingles    Interstitial cystitis    Pap smear, abnormal    cryosx    PID (acute pelvic inflammatory disease)    hx of   Ureteral reflux    as a child    Past Surgical History:  Procedure Laterality Date   DILATION AND CURETTAGE OF UTERUS     EXCISION OF SKIN TAG N/A 02/11/2016   Procedure: EXCISION OF ANAL SKIN TAG AN INTERNAL PAPILLA ;  Surgeon: AlLeighton RuffMD;  [facility];  Laterality: N/A;   LAPAROSCOPY     pelvic    MELANOMA EXCISION     Right leg     TONSILLECTOMY     Social History   Socioeconomic History   Marital status: Single    Spouse name: Not on file   Number of children: Not on file   Years of education: Not on file   Highest education level: Not on file  Occupational History   Not on file  Tobacco Use   Smoking status: Former    Types: Cigarettes    Quit date: 02/04/2012    Years since quitting: 10.7   Smokeless tobacco: Never  Vaping Use   Vaping Use: Never used  Substance and Sexual Activity   Alcohol use: Yes    Alcohol/week: 1.0 standard drink of alcohol    Types: 1 Standard drinks or equivalent per week    Comment: occasionally   Drug use: No   Sexual activity: Not on file  Other Topics Concern   Not on file  Social History Narrative   Not on file   Social Determinants of Health   Financial Resource Strain: Not on file  Food Insecurity: Not on file  Transportation Needs: Not on file  Physical Activity: Not on file  Stress: Not on file  Social Connections: Not on file  Intimate Partner Violence: Not on file   No current facility-administered medications on file prior to encounter.   Current Outpatient  Medications on File Prior to Encounter  Medication Sig Dispense Refill   albuterol (VENTOLIN HFA) 108 (90 Base) MCG/ACT inhaler Inhale 2 puffs into the lungs every 4 (four) hours as needed for wheezing. 18 g 0   ALPRAZolam (XANAX) 1 MG tablet   0   amoxicillin-clavulanate (AUGMENTIN) 875-125 MG tablet Take 1 tablet by mouth 2 (two) times daily. 14 tablet 0   baclofen (LIORESAL) 10 MG tablet Take 10 mg by mouth 3 (three) times daily as needed.     benzonatate (TESSALON) 200 MG capsule Take 1 capsule (200 mg total) by mouth 3 (three) times daily as needed for cough. 30 capsule 1   ELMIRON 100 MG capsule Take 2 tablets by mouth at bedtime.   11   fluticasone (FLONASE) 50 MCG/ACT nasal spray Place 1 spray into both nostrils in the morning and at bedtime for 7 days. 1 g 0   hydrOXYzine  (ATARAX/VISTARIL) 25 MG tablet Take 1 tablet (25 mg total) every 6 (six) hours by mouth. 12 tablet 0   norethindrone-ethinyl estradiol (MICROGESTIN,JUNEL,LOESTRIN) 1-20 MG-MCG tablet   11   predniSONE (DELTASONE) 10 MG tablet Take 4 pills once daily by mouth for 3 days, then 3 pills daily for 3 days, then 2 pills daily for 3 days then 1 pill daily for 3 days then stop 30 tablet 0   Spacer/Aero-Holding Chambers (AEROCHAMBER PLUS) inhaler Use with inhaler 1 each 2   [DISCONTINUED] dicyclomine (BENTYL) 20 MG tablet Take 1 tablet (20 mg total) by mouth 2 (two) times daily. 20 tablet 0   Allergies  Allergen Reactions   Cetirizine Diarrhea    REACTION: diarrhea    Cetirizine Hcl     REACTION: diarrhea   Hydrocodone-Acetaminophen Itching   Vicodin [Hydrocodone-Acetaminophen] Itching    I have reviewed patient's Past Medical Hx, Surgical Hx, Family Hx, Social Hx, medications and allergies.   ROS:  Review of Systems  Constitutional:  Negative for chills and fever.  Gastrointestinal:  Negative for diarrhea.  Genitourinary:  Positive for pelvic pain and vaginal bleeding. Negative for dysuria.  Neurological:  Negative for headaches.   Review of Systems  Other systems negative   Physical Exam  Physical Exam Patient Vitals for the past 24 hrs:  BP Temp Temp src Pulse SpO2 Height Weight  11/18/22 2133 122/68 98.2 F (36.8 C) Oral 86 100 % '5\' 10"'$  (1.778 m) 73.3 kg   Constitutional: Well-developed, well-nourished female in no acute distress.  Cardiovascular: normal rate Respiratory: normal effort GI: Abd soft, non-tender.  MS: Extremities nontender, no edema, normal ROM Neurologic: Alert and oriented x 4.  GU: Neg CVAT.  PELVIC EXAM: deferred in lieu of Transvaginal ultrasound  LAB RESULTS Results for orders placed or performed during the hospital encounter of 11/18/22 (from the past 24 hour(s))  Pregnancy, urine POC     Status: Abnormal   Collection Time: 11/18/22  9:08 PM  Result  Value Ref Range   Preg Test, Ur POSITIVE (A) NEGATIVE  Urinalysis, Routine w reflex microscopic Urine, Clean Catch     Status: Abnormal   Collection Time: 11/18/22  9:38 PM  Result Value Ref Range   Color, Urine YELLOW YELLOW   APPearance CLEAR CLEAR   Specific Gravity, Urine 1.017 1.005 - 1.030   pH 5.0 5.0 - 8.0   Glucose, UA NEGATIVE NEGATIVE mg/dL   Hgb urine dipstick MODERATE (A) NEGATIVE   Bilirubin Urine NEGATIVE NEGATIVE   Ketones, ur 20 (A) NEGATIVE mg/dL   Protein, ur  NEGATIVE NEGATIVE mg/dL   Nitrite NEGATIVE NEGATIVE   Leukocytes,Ua NEGATIVE NEGATIVE   RBC / HPF 0-5 0 - 5 RBC/hpf   WBC, UA 6-10 0 - 5 WBC/hpf   Bacteria, UA NONE SEEN NONE SEEN   Squamous Epithelial / LPF 0-5 0 - 5 /HPF   Mucus PRESENT   hCG, quantitative, pregnancy     Status: Abnormal   Collection Time: 11/18/22 10:18 PM  Result Value Ref Range   hCG, Beta Chain, Quant, S 9,626 (H) <5 mIU/mL  CBC     Status: None   Collection Time: 11/18/22 10:18 PM  Result Value Ref Range   WBC 9.2 4.0 - 10.5 K/uL   RBC 4.30 3.87 - 5.11 MIL/uL   Hemoglobin 13.1 12.0 - 15.0 g/dL   HCT 39.0 36.0 - 46.0 %   MCV 90.7 80.0 - 100.0 fL   MCH 30.5 26.0 - 34.0 pg   MCHC 33.6 30.0 - 36.0 g/dL   RDW 13.5 11.5 - 15.5 %   Platelets 305 150 - 400 K/uL   nRBC 0.0 0.0 - 0.2 %  Basic metabolic panel     Status: None   Collection Time: 11/18/22 10:18 PM  Result Value Ref Range   Sodium 138 135 - 145 mmol/L   Potassium 4.5 3.5 - 5.1 mmol/L   Chloride 106 98 - 111 mmol/L   CO2 25 22 - 32 mmol/L   Glucose, Bld 96 70 - 99 mg/dL   BUN 7 6 - 20 mg/dL   Creatinine, Ser 0.76 0.44 - 1.00 mg/dL   Calcium 9.6 8.9 - 10.3 mg/dL   GFR, Estimated >60 >60 mL/min   Anion gap 7 5 - 15  Wet prep, genital     Status: None   Collection Time: 11/18/22 11:22 PM  Result Value Ref Range   Yeast Wet Prep HPF POC NONE SEEN NONE SEEN   Trich, Wet Prep NONE SEEN NONE SEEN   Clue Cells Wet Prep HPF POC NONE SEEN NONE SEEN   WBC, Wet Prep HPF  POC <10 <10   Sperm NONE SEEN      IMAGING US OB LESS THAN 14 WEEKS WITH OB TRANSVAGINAL  Result Date: 11/18/2022 CLINICAL DATA:  Pregnant, vaginal bleeding and pelvic cramping. LMP 09/17/2022 EXAM: OBSTETRIC <14 WK [place] AND TRANSVAGINAL OB US TECHNIQUE: Both transabdominal and transvaginal ultrasound examinations were performed for complete evaluation of the gestation as well as the maternal uterus, adnexal regions, and pelvic cul-de-sac. Transvaginal technique was performed to assess early pregnancy. COMPARISON:  None Available. FINDINGS: Intrauterine gestational sac: Single Yolk sac:  Not visualized Embryo:  Not visualized Cardiac Activity: Not applicable MSD: 22 mm   7 w   1 d CRL: Not applicable Subchorionic hemorrhage:  None visualized. Maternal uterus/adnexae: The uterus is retroverted. The cervix is closed and is unremarkable. No intrauterine masses are seen. No free fluid within the cul-de-sac. The maternal ovaries are unremarkable. No adnexal masses are seen. IMPRESSION: 22 mm intrauterine gestational sac identified without visualized fetal pole or yolk sac. Findings are suspicious but not yet definitive for failed pregnancy. Recommend follow-up US in 10-14 days for definitive diagnosis. This recommendation follows SRU consensus guidelines: Diagnostic Criteria for Nonviable Pregnancy Early in the First Trimester. [employer] Med 2013; 324:4010-27. Electronically Signed   By: Fidela Salisbury M.D.   On: 11/18/2022 23:52    MAU Management/MDM: I have reviewed the triage vital signs and the nursing notes.   Pertinent labs & imaging  results that were available during my care of the patient were reviewed by me and considered in my medical decision making (see chart for details).      I have reviewed her medical records including past results, notes and treatments.   Ordered usual first trimester r/o ectopic labs.   Pelvic cultures done Will check baseline Ultrasound to rule out ectopic.  This  bleeding/pain can represent a normal pregnancy with bleeding, spontaneous abortion or even an ectopic which can be life-threatening.  The process as listed above helps to determine which of these is present.  Reviewed findings    Discussed radiologist deems this [place] to be suspicious but not confirmatory for a failed pregnancy.  Recommends repeat US in a week or so Patient prefers to do this at our clinic.    ASSESSMENT Pregnancy at 19w0dby LMP Vaginal bleeding Pelvic cramping Pregnancy of unknown location  PLAN Discharge home Plan to repeat Ultrasound in about 7-10 days SAB precautions Message sent to schedule followup [place] week Pt stable at time of discharge. Encouraged to return here if she develops worsening of symptoms, increase in pain, fever, or other concerning symptoms.    MHansel FeinsteinCNM, MSN Certified Nurse-Midwife 11/19/2022  12:17 AM

## 2022-11-19 NOTE — Progress Notes (Signed)
Chlamydia positive I attempted to order zithromax but there is already an order  Seabron Spates, CNM

## 2022-11-20 ENCOUNTER — Telehealth: Payer: Self-pay

## 2022-11-20 NOTE — Telephone Encounter (Signed)
Received following message in staff inbox:  From: Kennis Carina  Sent: 11/19/2022  12:29 AM EST  To: [facility] Admin Pool  Subject: Need to schedule followup US for early preg *   Saw her for bleeding and pain in MAU tonight   Overwhelmed with patients and cannot do the [place] scheduling  I put in order for followup OB Transvaginal US to rule out miscarriage   Needs to be next week around Jan 4-5 or so   Can you schedule that for me?   Thanks   Hansel Feinstein CNM      Called patient to inform her of [place] appointment made for 01/09 at 0800. Pt verbalized understanding and denied further questions.

## 2022-11-21 ENCOUNTER — Emergency Department: Payer: Medicaid Other

## 2022-11-21 ENCOUNTER — Emergency Department
Admission: EM | Admit: 2022-11-21 | Discharge: 2022-11-21 | Disposition: A | Discharge status: Home / Self Care | Payer: Medicaid Other | Attending: Emergency Medicine | Admitting: Emergency Medicine

## 2022-11-21 ENCOUNTER — Other Ambulatory Visit: Payer: Self-pay

## 2022-11-21 DIAGNOSIS — M20011 Mallet finger of right finger(s): Secondary | ICD-10-CM | POA: Insufficient documentation

## 2022-11-21 DIAGNOSIS — O039 Complete or unspecified spontaneous abortion without complication: Secondary | ICD-10-CM | POA: Insufficient documentation

## 2022-11-21 DIAGNOSIS — O9A211 Injury, poisoning and certain other consequences of external causes complicating pregnancy, first trimester: Secondary | ICD-10-CM | POA: Diagnosis present

## 2022-11-21 DIAGNOSIS — S060X0A Concussion without loss of consciousness, initial encounter: Secondary | ICD-10-CM | POA: Diagnosis not present

## 2022-11-21 LAB — CBC WITH DIFFERENTIAL/PLATELET
Abs Immature Granulocytes: 0.02 10*3/uL (ref 0.00–0.07)
Basophils Absolute: 0.1 10*3/uL (ref 0.0–0.1)
Basophils Relative: 1 %
Eosinophils Absolute: 0.5 10*3/uL (ref 0.0–0.5)
Eosinophils Relative: 5 %
HCT: 40.4 % (ref 36.0–46.0)
Hemoglobin: 13.4 g/dL (ref 12.0–15.0)
Immature Granulocytes: 0 %
Lymphocytes Relative: 38 %
Lymphs Abs: 4 10*3/uL (ref 0.7–4.0)
MCH: 29.9 pg (ref 26.0–34.0)
MCHC: 33.2 g/dL (ref 30.0–36.0)
MCV: 90.2 fL (ref 80.0–100.0)
Monocytes Absolute: 0.8 10*3/uL (ref 0.1–1.0)
Monocytes Relative: 7 %
Neutro Abs: 5.2 10*3/uL (ref 1.7–7.7)
Neutrophils Relative %: 49 %
Platelets: 294 10*3/uL (ref 150–400)
RBC: 4.48 MIL/uL (ref 3.87–5.11)
RDW: 13.3 % (ref 11.5–15.5)
WBC: 10.6 10*3/uL — ABNORMAL HIGH (ref 4.0–10.5)
nRBC: 0 % (ref 0.0–0.2)

## 2022-11-21 LAB — BASIC METABOLIC PANEL
Anion gap: 11 (ref 5–15)
BUN: 7 mg/dL (ref 6–20)
CO2: 22 mmol/L (ref 22–32)
Calcium: 9.3 mg/dL (ref 8.9–10.3)
Chloride: 106 mmol/L (ref 98–111)
Creatinine, Ser: 0.66 mg/dL (ref 0.44–1.00)
GFR, Estimated: >60 mL/min (ref >60)
Glucose, Bld: 91 mg/dL (ref 70–99)
Potassium: 3.4 mmol/L — ABNORMAL LOW (ref 3.5–5.1)
Sodium: 139 mmol/L (ref 135–145)

## 2022-11-21 LAB — HCG, QUANTITATIVE, PREGNANCY: hCG, Beta Chain, Quant, S: 7342 m[IU]/mL — ABNORMAL HIGH (ref <5)

## 2022-11-21 LAB — ABO/RH: ABO/RH(D): O POS
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
## 2022-11-21 NOTE — ED Provider Notes (Signed)
Officer [employer] requesting results of imaging. I discussed results of the CT scans and right hand with the patient who then spoke with the officer. She also requested a printed result of the hand image to give him.  He was advised that if needed, a copy of her medical record could be obtained via subpoena if she consents.   Victorino Dike, FNP 11/21/22 1745    Carrie Mew, MD 11/21/22 [phone]

## 2022-11-21 NOTE — Discharge Instructions (Signed)
Please alternate Tylenol and ibuprofen as needed for pain.  Call your [facility] physician in 2 days and schedule follow-up appointment.  Please call orthopedist to schedule follow-up appointment.  Return to the ER for any increasing pain, increased vaginal bleeding, fevers, worsening symptoms or any urgent changes in your health

## 2022-12-01 ENCOUNTER — Other Ambulatory Visit: Payer: Medicaid Other

## 2022-12-01 ENCOUNTER — Ambulatory Visit: Payer: Medicaid Other | Admitting: Advanced Practice Midwife

## 2023-04-21 ENCOUNTER — Observation Stay (HOSPITAL_COMMUNITY)
Admission: EM | Admit: 2023-04-21 | Discharge: 2023-04-22 | Disposition: A | Discharge status: Home / Self Care | Payer: Medicaid Other | Attending: Surgery | Admitting: Surgery

## 2023-04-21 ENCOUNTER — Other Ambulatory Visit: Payer: Self-pay

## 2023-04-21 ENCOUNTER — Emergency Department (HOSPITAL_COMMUNITY): Payer: Medicaid Other

## 2023-04-21 ENCOUNTER — Encounter (HOSPITAL_COMMUNITY): Payer: Self-pay | Admitting: Emergency Medicine

## 2023-04-21 DIAGNOSIS — Z87891 Personal history of nicotine dependence: Secondary | ICD-10-CM | POA: Insufficient documentation

## 2023-04-21 DIAGNOSIS — Z7952 Long term (current) use of systemic steroids: Secondary | ICD-10-CM | POA: Insufficient documentation

## 2023-04-21 DIAGNOSIS — K3580 Unspecified acute appendicitis: Principal | ICD-10-CM | POA: Diagnosis present

## 2023-04-21 DIAGNOSIS — R1030 Lower abdominal pain, unspecified: Secondary | ICD-10-CM

## 2023-04-21 DIAGNOSIS — R112 Nausea with vomiting, unspecified: Secondary | ICD-10-CM

## 2023-04-21 DIAGNOSIS — Z79899 Other long term (current) drug therapy: Secondary | ICD-10-CM | POA: Insufficient documentation

## 2023-04-21 DIAGNOSIS — Z85820 Personal history of malignant melanoma of skin: Secondary | ICD-10-CM | POA: Diagnosis not present

## 2023-04-21 DIAGNOSIS — R109 Unspecified abdominal pain: Secondary | ICD-10-CM | POA: Diagnosis present

## 2023-04-21 LAB — URINALYSIS, ROUTINE W REFLEX MICROSCOPIC
Bilirubin Urine: NEGATIVE
Glucose, UA: NEGATIVE mg/dL
Ketones, ur: NEGATIVE mg/dL
Leukocytes,Ua: NEGATIVE
Nitrite: NEGATIVE
Protein, ur: NEGATIVE mg/dL
Specific Gravity, Urine: 1.02 (ref 1.005–1.030)
pH: 5 (ref 5.0–8.0)

## 2023-04-21 LAB — CBC
HCT: 41.2 % (ref 36.0–46.0)
Hemoglobin: 13.5 g/dL (ref 12.0–15.0)
MCH: 29.5 pg (ref 26.0–34.0)
MCHC: 32.8 g/dL (ref 30.0–36.0)
MCV: 90 fL (ref 80.0–100.0)
Platelets: 311 10*3/uL (ref 150–400)
RBC: 4.58 MIL/uL (ref 3.87–5.11)
RDW: 13.2 % (ref 11.5–15.5)
WBC: 10.4 10*3/uL (ref 4.0–10.5)
nRBC: 0 % (ref 0.0–0.2)

## 2023-04-21 LAB — COMPREHENSIVE METABOLIC PANEL
ALT: 14 U/L (ref 0–44)
AST: 16 U/L (ref 15–41)
Albumin: 3.9 g/dL (ref 3.5–5.0)
Alkaline Phosphatase: 66 U/L (ref 38–126)
Anion gap: 10 (ref 5–15)
BUN: 11 mg/dL (ref 6–20)
CO2: 23 mmol/L (ref 22–32)
Calcium: 9.5 mg/dL (ref 8.9–10.3)
Chloride: 103 mmol/L (ref 98–111)
Creatinine, Ser: 0.78 mg/dL (ref 0.44–1.00)
GFR, Estimated: >60 mL/min (ref >60)
Glucose, Bld: 101 mg/dL — ABNORMAL HIGH (ref 70–99)
Potassium: 4.2 mmol/L (ref 3.5–5.1)
Sodium: 136 mmol/L (ref 135–145)
Total Bilirubin: 0.3 mg/dL (ref 0.3–1.2)
Total Protein: 7.6 g/dL (ref 6.5–8.1)

## 2023-04-21 LAB — I-STAT BETA HCG BLOOD, ED (MC, WL, AP ONLY): I-stat hCG, quantitative: <5 m[IU]/mL (ref <5)

## 2023-04-21 MED ORDER — MORPHINE SULFATE (PF) 4 MG/ML IV SOLN
4.0000 mg | Freq: Once | INTRAVENOUS | Status: AC
  Administered 2023-04-21: 4 mg via INTRAVENOUS
  Filled 2023-04-21: qty 1

## 2023-04-21 MED ORDER — SODIUM CHLORIDE 0.9 % IV SOLN: 2.0000 g | INTRAVENOUS | Status: DC

## 2023-04-21 MED ORDER — HYDROMORPHONE HCL 1 MG/ML IJ SOLN
0.5000 mg | INTRAMUSCULAR | Status: DC | PRN
  Administered 2023-04-22 (×3): 0.5 mg via INTRAVENOUS
  Filled 2023-04-21 (×3): qty 0.5

## 2023-04-21 MED ORDER — METRONIDAZOLE 500 MG/100ML IV SOLN
500.0000 mg | Freq: Once | INTRAVENOUS | Status: AC
  Administered 2023-04-21: 500 mg via INTRAVENOUS
  Filled 2023-04-21: qty 100

## 2023-04-21 MED ORDER — ONDANSETRON HCL 4 MG/2ML IJ SOLN
4.0000 mg | Freq: Once | INTRAMUSCULAR | Status: AC
  Administered 2023-04-21: 4 mg via INTRAVENOUS
  Filled 2023-04-21: qty 2

## 2023-04-21 MED ORDER — SODIUM CHLORIDE 0.9 % IV SOLN: 1.0000 g | Freq: Once | INTRAVENOUS | Status: DC

## 2023-04-21 MED ORDER — MELATONIN 3 MG PO TABS
3.0000 mg | ORAL_TABLET | Freq: Every evening | ORAL | Status: DC | PRN
  Administered 2023-04-22: 3 mg via ORAL
  Filled 2023-04-21: qty 1

## 2023-04-21 MED ORDER — ONDANSETRON HCL 4 MG/2ML IJ SOLN: 4.0000 mg | Freq: Four times a day (QID) | INTRAMUSCULAR | Status: DC | PRN

## 2023-04-21 MED ORDER — IOHEXOL 350 MG/ML SOLN
75.0000 mL | Freq: Once | INTRAVENOUS | Status: AC | PRN
  Administered 2023-04-21: 75 mL via INTRAVENOUS

## 2023-04-21 MED ORDER — SODIUM CHLORIDE 0.9 % IV SOLN
2.0000 g | Freq: Once | INTRAVENOUS | Status: AC
  Administered 2023-04-21: 2 g via INTRAVENOUS
  Filled 2023-04-21: qty 20

## 2023-04-21 MED ORDER — ENOXAPARIN SODIUM 40 MG/0.4ML IJ SOSY: 40.0000 mg | PREFILLED_SYRINGE | Freq: Every day | INTRAMUSCULAR | Status: DC

## 2023-04-21 MED ORDER — LACTATED RINGERS IV SOLN: INTRAVENOUS | Status: DC

## 2023-04-21 MED ORDER — DIPHENHYDRAMINE HCL 50 MG/ML IJ SOLN: 25.0000 mg | Freq: Four times a day (QID) | INTRAMUSCULAR | Status: DC | PRN

## 2023-04-21 MED ORDER — DIPHENHYDRAMINE HCL 25 MG PO CAPS: 25.0000 mg | ORAL_CAPSULE | Freq: Four times a day (QID) | ORAL | Status: DC | PRN

## 2023-04-21 MED ORDER — ONDANSETRON 4 MG PO TBDP: 4.0000 mg | ORAL_TABLET | Freq: Four times a day (QID) | ORAL | Status: DC | PRN

## 2023-04-21 MED ORDER — IBUPROFEN 400 MG PO TABS
400.0000 mg | ORAL_TABLET | Freq: Once | ORAL | Status: AC | PRN
  Administered 2023-04-21: 400 mg via ORAL
  Filled 2023-04-21: qty 1

## 2023-04-21 MED ORDER — METRONIDAZOLE 500 MG/100ML IV SOLN: 500.0000 mg | Freq: Two times a day (BID) | INTRAVENOUS | Status: DC

## 2023-04-21 NOTE — ED Provider Notes (Signed)
[place] EMERGENCY DEPARTMENT AT [facility] Provider Note   CSN: [id number] Arrival date & time: 04/21/23  1255     History  Chief Complaint  Patient presents with   Back Pain    Pamela Calhoun is a 37 y.o. female.  37 year old female with a history of PID, UTIs, and endometriosis who presents to the emergency department with left flank pain.  Has been going on for the past month and was treated with Macrobid for UTI.  Also received PID treatment several weeks ago.  Says that her pain improved but that she started having recurrence of her left flank pain that is sharp and burning.  Radiates down to her left groin.  Worsened with urination.  Had a fever of 101.0 recently and decided to come into the emergency department for evaluation.  No vaginal bleeding or discharge.  Denies any dysuria but has had some frequency.  Only surgery was laparoscopy for endometriosis.       Home Medications Prior to Admission medications   Medication Sig Start Date End Date Taking? Authorizing Provider  albuterol (VENTOLIN HFA) 108 (90 Base) MCG/ACT inhaler Inhale 2 puffs into the lungs every 4 (four) hours as needed for wheezing. 11/21/21   Mort Sawyers, FNP  ELMIRON 100 MG capsule Take 2 tablets by mouth at bedtime.  02/18/17   [provider]  fluticasone (FLONASE) 50 MCG/ACT nasal spray Place 1 spray into both nostrils in the morning and at bedtime for 7 days. 08/16/20 10/05/20  Eusebio Friendly B, PA-C  hydrOXYzine (ATARAX/VISTARIL) 25 MG tablet Take 1 tablet (25 mg total) every 6 (six) hours by mouth. 10/05/17   Georgiana Shore, PA-C  Spacer/Aero-Holding Chambers (AEROCHAMBER PLUS) inhaler Use with inhaler 10/05/20   Domenick Gong, MD  dicyclomine (BENTYL) 20 MG tablet Take 1 tablet (20 mg total) by mouth 2 (two) times daily. 09/10/20 11/05/20  Becky Augusta, NP      Allergies    Cetirizine, Cetirizine hcl, Hydrocodone-acetaminophen, and Vicodin  [hydrocodone-acetaminophen]    Review of Systems   Review of Systems  Physical Exam Updated Vital Signs BP 118/85   Pulse 73   Temp 98.6 F (37 C) (Oral)   Resp 15   Ht 5\' 10"  (1.778 m)   Wt 71.7 kg   LMP 02/05/2023   SpO2 100%   Breastfeeding Unknown   BMI 22.67 kg/m  Physical Exam Vitals and nursing note reviewed.  Constitutional:      General: She is not in acute distress.    Appearance: She is well-developed.  HENT:     Head: Normocephalic and atraumatic.     Right Ear: External ear normal.     Left Ear: External ear normal.     Nose: Nose normal.  Eyes:     Extraocular Movements: Extraocular movements intact.     Conjunctiva/sclera: Conjunctivae normal.     Pupils: Pupils are equal, round, and reactive to light.  Cardiovascular:     Rate and Rhythm: Normal rate and regular rhythm.     Heart sounds: No murmur heard. Pulmonary:     Effort: Pulmonary effort is normal. No respiratory distress.     Breath sounds: Normal breath sounds.  Abdominal:     General: Abdomen is flat. There is no distension.     Palpations: Abdomen is soft. There is no mass.     Tenderness: There is abdominal tenderness (Left upper quadrant and left lower quadrant). There is left CVA tenderness. There is no  right CVA tenderness or guarding.  Musculoskeletal:     Cervical back: Normal range of motion and neck supple.     Right lower leg: No edema.     Left lower leg: No edema.  Skin:    General: Skin is warm and dry.  Neurological:     Mental Status: She is alert and oriented to person, place, and time. Mental status is at baseline.  Psychiatric:        Mood and Affect: Mood normal.     ED Results / Procedures / Treatments   Labs (all labs ordered are listed, but only abnormal results are displayed) Labs Reviewed  COMPREHENSIVE METABOLIC PANEL - Abnormal; Notable for the following components:      Result Value   Glucose, Bld 101 (*)    All other components within normal limits   URINALYSIS, ROUTINE W REFLEX MICROSCOPIC - Abnormal; Notable for the following components:   APPearance HAZY (*)    Hgb urine dipstick SMALL (*)    Bacteria, UA RARE (*)    All other components within normal limits  CBC  I-STAT BETA HCG BLOOD, ED ([facility], [facility], [facility] ONLY)    EKG EKG Interpretation  Date/Time:  Wednesday Apr 21 2023 21:04:51 EDT Ventricular Rate:  77 PR Interval:  108 QRS Duration: 89 QT Interval:  375 QTC Calculation: 425 R Axis:   74 Text Interpretation: Sinus rhythm Short PR interval Confirmed by Vonita Moss [phone]) on 04/21/2023 9:16:45 PM  Radiology CT ABDOMEN PELVIS W CONTRAST  Result Date: 04/21/2023 CLINICAL DATA:  Left flank pain. EXAM: CT ABDOMEN AND PELVIS WITH CONTRAST TECHNIQUE: Multidetector CT imaging of the abdomen and pelvis was performed using the standard protocol following bolus administration of intravenous contrast. RADIATION DOSE REDUCTION: This exam was performed according to the departmental dose-optimization program which includes automated exposure control, adjustment of the mA and/or kV according to patient size and/or use of iterative reconstruction technique. CONTRAST:  75mL OMNIPAQUE IOHEXOL 350 MG/ML SOLN COMPARISON:  None Available. FINDINGS: Lower chest: No acute abnormality. Hepatobiliary: No focal liver abnormality is seen. No gallstones, gallbladder wall thickening, or biliary dilatation. Pancreas: Unremarkable. No pancreatic ductal dilatation or surrounding inflammatory changes. Spleen: Normal in size without focal abnormality. Adrenals/Urinary Tract: Adrenal glands are unremarkable. Kidneys are normal in size, without renal calculi or hydronephrosis. Small bilateral simple renal cysts are seen. The urinary bladder is poorly distended and subsequently limited in evaluation. Stomach/Bowel: Stomach is within normal limits. A 7.3 mm diameter fluid-filled retrocecal appendix is seen (axial CT images 72 through 74, CT series 3). This is within  the midline of the pelvis adjacent to the anterior aspect of the uterine fundus. A mild amount of periappendiceal inflammatory fat stranding is suspected. No evidence of bowel dilatation. Vascular/Lymphatic: No significant vascular findings are present. No enlarged abdominal or pelvic lymph nodes. Reproductive: Uterus and bilateral adnexa are unremarkable. Other: No abdominal wall hernia or abnormality. A small amount of posterior pelvic free fluid is seen on the right. Musculoskeletal: No acute or significant osseous findings. IMPRESSION: 1. Findings suspicious for early acute appendicitis. 2. Small amount of posterior pelvic free fluid on the right, likely physiologic. 3. Small bilateral simple renal cysts. No follow-up imaging is recommended. This recommendation follows ACR consensus guidelines: Management of the Incidental Renal Mass on CT: A White Paper of the ACR Incidental Findings Committee. J Am Coll Radiol [phone]. Electronically Signed   By: Aram Candela M.D.   On: 04/21/2023 22:25    Procedures  Procedures    Medications Ordered in ED Medications  cefTRIAXone (ROCEPHIN) 2 g in sodium chloride 0.9 % 100 mL IVPB (0 g Intravenous Stopped 04/21/23 2326)    And  metroNIDAZOLE (FLAGYL) IVPB 500 mg (500 mg Intravenous New Bag/Given 04/21/23 2251)  cefTRIAXone (ROCEPHIN) 2 g in sodium chloride 0.9 % 100 mL IVPB (has no administration in time range)    And  metroNIDAZOLE (FLAGYL) IVPB 500 mg (has no administration in time range)  enoxaparin (LOVENOX) injection 40 mg (has no administration in time range)  lactated ringers infusion (has no administration in time range)  HYDROmorphone (DILAUDID) injection 0.5 mg (has no administration in time range)  diphenhydrAMINE (BENADRYL) capsule 25 mg (has no administration in time range)    Or  diphenhydrAMINE (BENADRYL) injection 25 mg (has no administration in time range)  melatonin tablet 3 mg (has no administration in time range)   ondansetron (ZOFRAN-ODT) disintegrating tablet 4 mg (has no administration in time range)    Or  ondansetron (ZOFRAN) injection 4 mg (has no administration in time range)  ibuprofen (ADVIL) tablet 400 mg (400 mg Oral Given 04/21/23 1731)  morphine (PF) 4 MG/ML injection 4 mg (4 mg Intravenous Given 04/21/23 2108)  ondansetron (ZOFRAN) injection 4 mg (4 mg Intravenous Given 04/21/23 2107)  iohexol (OMNIPAQUE) 350 MG/ML injection 75 mL (75 mLs Intravenous Contrast Given 04/21/23 2208)  morphine (PF) 4 MG/ML injection 4 mg (4 mg Intravenous Given 04/21/23 2246)  ondansetron (ZOFRAN) injection 4 mg (4 mg Intravenous Given 04/21/23 2246)    ED Course/ Medical Decision Making/ A&P Clinical Course as of 04/21/23 2340  Wed Apr 21, 2023  2235 Dr Freida Busman from surgery to evaluate the patient [RP]    Clinical Course User Index [RP] Rondel Baton, MD                             Medical Decision Making Amount and/or Complexity of Data Reviewed Labs: ordered. Radiology: ordered.  Risk Prescription drug management. Decision regarding hospitalization.   Pamela Calhoun is a 37 y.o. female with comorbidities that complicate the patient evaluation including PID and UTI who presents emergency department with left flank pain  Initial Ddx:  Kidney stone, pyelonephritis, perinephric abscess, diverticulitis, PID  MDM:  Concern the patient may potentially have pyelonephritis at this time especially given her fever at home.  Could also have an infected kidney stone which could be the reason that it did not clear up with the antibiotic she was given.  The duration of her symptoms is also at risk for perinephric abscess so we will obtain a CT abdomen pelvis with IV contrast.  Could potentially also represent diverticulitis.  Low concern for PID since he was adequately treated and is not complaining of any pelvic pain.  Plan:  Labs Urinalysis CT abdomen pelvis IV contrast Pain and nausea  medication   ED Summary/Re-evaluation:  CT scan showed appendicitis.  General surgery consulted and admitted the patient for further management.  Given ceftriaxone and Flagyl prior to admission.  This patient presents to the ED for concern of complaints listed in HPI, this involves an extensive number of treatment options, and is a complaint that carries with it a high risk of complications and morbidity. Disposition including potential need for admission considered.   Dispo: Admit to Floor  Additional history obtained from family Records reviewed Outpatient Clinic Notes The following labs were independently interpreted: CBC and show no  acute abnormality I independently reviewed the following imaging with scope of interpretation limited to determining acute life threatening conditions related to emergency care: CT Abdomen/Pelvis and agree with the radiologist interpretation with the following exceptions: none I personally reviewed and interpreted cardiac monitoring: normal sinus rhythm  I personally reviewed and interpreted the pt's EKG: see above for interpretation  I have reviewed the patients home medications and made adjustments as needed Consults: General Surgery       Final Clinical Impression(s) / ED Diagnoses Final diagnoses:  Acute appendicitis, unspecified acute appendicitis type  Nausea and vomiting, unspecified vomiting type  Lower abdominal pain    Rx / DC Orders ED Discharge Orders     None         Rondel Baton, MD 04/21/23 2340

## 2023-04-21 NOTE — ED Notes (Signed)
ED TO INPATIENT HANDOFF REPORT  ED Nurse Name and Phone #: Murlean Iba PM [phone]  S Name/Age/Gender Pamela Calhoun 37 y.o. female Room/Bed: 003C/003C  Code Status   Code Status: Full Code  Home/SNF/Other Home Patient oriented to: self, place, time, and situation Is this baseline? Yes   Triage Complete: Triage complete  Chief Complaint Acute appendicitis [K35.80]  Triage Note Pt reports left flank pain for the past month, pt reports she was taking medication for UTI and pain went away but returned last week. Pt reports fever for the last few days. As well as vomiting. Pt reports pain radiates to left leg. VSS.    Allergies Allergies  Allergen Reactions   Cetirizine Diarrhea    REACTION: diarrhea    Cetirizine Hcl     REACTION: diarrhea   Hydrocodone-Acetaminophen Itching   Vicodin [Hydrocodone-Acetaminophen] Itching    Level of Care/Admitting Diagnosis ED Disposition     ED Disposition  Admit   Condition  --   Comment  Hospital Area: [facility] [[id number]]  Level of Care: Med-Surg [16]  May place patient in observation at [facility] or [facility] if equivalent level of care is available:: No  Covid Evaluation: Asymptomatic - no recent exposure (last 10 days) testing not required  Diagnosis: Acute appendicitis [454098]  Admitting Physician: Fritzi Mandes [[id number]]  Attending Physician: CCS, MD [[id number]]          B Medical/Surgery History Past Medical History:  Diagnosis Date   Allergy    Cancer (HCC)    skin- hystory of melanoma    Depression    Endometriosis    Fracture    right foot   Frequent UTI    History of shingles    Interstitial cystitis    Pap smear, abnormal    cryosx    PID (acute pelvic inflammatory disease)    hx of   Ureteral reflux    as a child    Past Surgical History:  Procedure Laterality Date   DILATION AND CURETTAGE OF UTERUS     EXCISION OF SKIN TAG N/A 02/11/2016   Procedure: EXCISION OF ANAL SKIN  TAG AN INTERNAL PAPILLA ;  Surgeon: Romie Levee, MD;  Location: [facility];  Service: General;  Laterality: N/A;   LAPAROSCOPY     pelvic    MELANOMA EXCISION     Right leg    TONSILLECTOMY       A IV Location/Drains/Wounds Patient Lines/Drains/Airways Status     Active Line/Drains/Airways     Name Placement date Placement time Site Days   Peripheral IV 04/21/23 20 G 1" Right Antecubital 04/21/23  2105  Antecubital  less than 1            Intake/Output Last 24 hours No intake or output data in the 24 hours ending 04/21/23 2325  Labs/Imaging Results for orders placed or performed during the hospital encounter of 04/21/23 (from the past 48 hour(s))  Comprehensive metabolic panel     Status: Abnormal   Collection Time: 04/21/23  1:24 PM  Result Value Ref Range   Sodium 136 135 - 145 mmol/L   Potassium 4.2 3.5 - 5.1 mmol/L   Chloride 103 98 - 111 mmol/L   CO2 23 22 - 32 mmol/L   Glucose, Bld 101 (H) 70 - 99 mg/dL    Comment: Glucose reference range applies only to samples taken after fasting for at least 8 hours.   BUN 11 6 -  20 mg/dL   Creatinine, Ser 1.61 0.44 - 1.00 mg/dL   Calcium 9.5 8.9 - 09.6 mg/dL   Total Protein 7.6 6.5 - 8.1 g/dL   Albumin 3.9 3.5 - 5.0 g/dL   AST 16 15 - 41 U/L   ALT 14 0 - 44 U/L   Alkaline Phosphatase 66 38 - 126 U/L   Total Bilirubin 0.3 0.3 - 1.2 mg/dL   GFR, Estimated >04 >54 mL/min    Comment: (NOTE) Calculated using the CKD-EPI Creatinine Equation (2021)    Anion gap 10 5 - 15    Comment: Performed at [facility], [address] [place], [place] [address]  CBC     Status: None   Collection Time: 04/21/23  1:24 PM  Result Value Ref Range   WBC 10.4 4.0 - 10.5 K/uL   RBC 4.58 3.87 - 5.11 MIL/uL   Hemoglobin 13.5 12.0 - 15.0 g/dL   HCT 91.4 78.2 - 95.6 %   MCV 90.0 80.0 - 100.0 fL   MCH 29.5 26.0 - 34.0 pg   MCHC 32.8 30.0 - 36.0 g/dL   RDW 21.3 08.6 - 57.8 %   Platelets 311 150 - 400 K/uL   nRBC 0.0  0.0 - 0.2 %    Comment: Performed at [facility], [address] [place], [place] [address]  I-Stat beta hCG blood, ED ([facility], [facility], [facility] only)     Status: None   Collection Time: 04/21/23  1:30 PM  Result Value Ref Range   I-stat hCG, quantitative <5.0 <5 mIU/mL   Comment 3            Comment:   GEST. AGE      CONC.  (mIU/mL)   <=1 WEEK        5 - 50     2 WEEKS       50 - 500     3 WEEKS       100 - 10,000     4 WEEKS     1,000 - 30,000        FEMALE AND NON-PREGNANT FEMALE:     LESS THAN 5 mIU/mL   Urinalysis, Routine w reflex microscopic -Urine, Clean Catch     Status: Abnormal   Collection Time: 04/21/23  2:58 PM  Result Value Ref Range   Color, Urine YELLOW YELLOW   APPearance HAZY (A) CLEAR   Specific Gravity, Urine 1.020 1.005 - 1.030   pH 5.0 5.0 - 8.0   Glucose, UA NEGATIVE NEGATIVE mg/dL   Hgb urine dipstick SMALL (A) NEGATIVE   Bilirubin Urine NEGATIVE NEGATIVE   Ketones, ur NEGATIVE NEGATIVE mg/dL   Protein, ur NEGATIVE NEGATIVE mg/dL   Nitrite NEGATIVE NEGATIVE   Leukocytes,Ua NEGATIVE NEGATIVE   RBC / HPF 0-5 0 - 5 RBC/hpf   WBC, UA 0-5 0 - 5 WBC/hpf   Bacteria, UA RARE (A) NONE SEEN   Squamous Epithelial / HPF 21-50 0 - 5 /HPF   Mucus PRESENT     Comment: Performed at [facility], [address], [place], [place] [address]   CT ABDOMEN PELVIS W CONTRAST  Result Date: 04/21/2023 CLINICAL DATA:  Left flank pain. EXAM: CT ABDOMEN AND PELVIS WITH CONTRAST TECHNIQUE: Multidetector CT imaging of the abdomen and pelvis was performed using the standard protocol following bolus administration of intravenous contrast. RADIATION DOSE REDUCTION: This exam was performed according to the departmental dose-optimization program which includes automated exposure control,  adjustment of the mA and/or kV according to patient size and/or use of iterative reconstruction technique. CONTRAST:  75mL OMNIPAQUE IOHEXOL 350 MG/ML SOLN COMPARISON:  None Available. FINDINGS: Lower  chest: No acute abnormality. Hepatobiliary: No focal liver abnormality is seen. No gallstones, gallbladder wall thickening, or biliary dilatation. Pancreas: Unremarkable. No pancreatic ductal dilatation or surrounding inflammatory changes. Spleen: Normal in size without focal abnormality. Adrenals/Urinary Tract: Adrenal glands are unremarkable. Kidneys are normal in size, without renal calculi or hydronephrosis. Small bilateral simple renal cysts are seen. The urinary bladder is poorly distended and subsequently limited in evaluation. Stomach/Bowel: Stomach is within normal limits. A 7.3 mm diameter fluid-filled retrocecal appendix is seen (axial CT images 72 through 74, CT series 3). This is within the midline of the pelvis adjacent to the anterior aspect of the uterine fundus. A mild amount of periappendiceal inflammatory fat stranding is suspected. No evidence of bowel dilatation. Vascular/Lymphatic: No significant vascular findings are present. No enlarged abdominal or pelvic lymph nodes. Reproductive: Uterus and bilateral adnexa are unremarkable. Other: No abdominal wall hernia or abnormality. A small amount of posterior pelvic free fluid is seen on the right. Musculoskeletal: No acute or significant osseous findings. IMPRESSION: 1. Findings suspicious for early acute appendicitis. 2. Small amount of posterior pelvic free fluid on the right, likely physiologic. 3. Small bilateral simple renal cysts. No follow-up imaging is recommended. This recommendation follows ACR consensus guidelines: Management of the Incidental Renal Mass on CT: A White Paper of the ACR Incidental Findings Committee. J Am Coll Radiol [phone]. Electronically Signed   By: Aram Candela M.D.   On: 04/21/2023 22:25    Pending Labs Unresulted Labs (From admission, onward)     Start     Ordered   04/28/23 0500  Creatinine, serum  (enoxaparin (LOVENOX)    CrCl >/= 30 ml/min)  Weekly,   R     Comments: while on enoxaparin  therapy    04/21/23 2311            Vitals/Pain Today's Vitals   04/21/23 2105 04/21/23 2140 04/21/23 2245 04/21/23 2319  BP: 116/78  118/85   Pulse: 75  73   Resp: 12  15   Temp:      TempSrc:      SpO2: 100%  100%   Weight:      Height:      PainSc:  3   3     Isolation Precautions No active isolations  Medications Medications  cefTRIAXone (ROCEPHIN) 2 g in sodium chloride 0.9 % 100 mL IVPB (2 g Intravenous New Bag/Given 04/21/23 2254)    And  metroNIDAZOLE (FLAGYL) IVPB 500 mg (500 mg Intravenous New Bag/Given 04/21/23 2251)  cefTRIAXone (ROCEPHIN) 2 g in sodium chloride 0.9 % 100 mL IVPB (has no administration in time range)    And  metroNIDAZOLE (FLAGYL) IVPB 500 mg (has no administration in time range)  enoxaparin (LOVENOX) injection 40 mg (has no administration in time range)  lactated ringers infusion (has no administration in time range)  HYDROmorphone (DILAUDID) injection 0.5 mg (has no administration in time range)  diphenhydrAMINE (BENADRYL) capsule 25 mg (has no administration in time range)    Or  diphenhydrAMINE (BENADRYL) injection 25 mg (has no administration in time range)  melatonin tablet 3 mg (has no administration in time range)  ondansetron (ZOFRAN-ODT) disintegrating tablet 4 mg (has no administration in time range)    Or  ondansetron (ZOFRAN) injection 4 mg (has no administration in time  range)  ibuprofen (ADVIL) tablet 400 mg (400 mg Oral Given 04/21/23 1731)  morphine (PF) 4 MG/ML injection 4 mg (4 mg Intravenous Given 04/21/23 2108)  ondansetron (ZOFRAN) injection 4 mg (4 mg Intravenous Given 04/21/23 2107)  iohexol (OMNIPAQUE) 350 MG/ML injection 75 mL (75 mLs Intravenous Contrast Given 04/21/23 2208)  morphine (PF) 4 MG/ML injection 4 mg (4 mg Intravenous Given 04/21/23 2246)  ondansetron (ZOFRAN) injection 4 mg (4 mg Intravenous Given 04/21/23 2246)    Mobility walks     Focused Assessments     R Recommendations: See Admitting  Provider Note  Report given to:   Additional Notes:

## 2023-04-21 NOTE — ED Triage Notes (Signed)
Pt reports left flank pain for the past month, pt reports she was taking medication for UTI and pain went away but returned last week. Pt reports fever for the last few days. As well as vomiting. Pt reports pain radiates to left leg. VSS.

## 2023-04-21 NOTE — ED Notes (Signed)
Pt to sort RN complaining of pain. Triage order set for acute pain utilized.

## 2023-04-22 ENCOUNTER — Encounter (HOSPITAL_COMMUNITY)
Admission: EM | Disposition: A | Discharge status: Home / Self Care | Payer: Self-pay | Source: Home / Self Care | Attending: Emergency Medicine

## 2023-04-22 ENCOUNTER — Observation Stay (HOSPITAL_BASED_OUTPATIENT_CLINIC_OR_DEPARTMENT_OTHER): Payer: Medicaid Other | Admitting: Certified Registered Nurse Anesthetist

## 2023-04-22 ENCOUNTER — Other Ambulatory Visit: Payer: Self-pay

## 2023-04-22 ENCOUNTER — Observation Stay (HOSPITAL_COMMUNITY): Payer: Medicaid Other | Admitting: Certified Registered Nurse Anesthetist

## 2023-04-22 ENCOUNTER — Encounter (HOSPITAL_COMMUNITY): Payer: Self-pay | Admitting: Surgery

## 2023-04-22 DIAGNOSIS — K3580 Unspecified acute appendicitis: Secondary | ICD-10-CM | POA: Diagnosis not present

## 2023-04-22 DIAGNOSIS — Z87891 Personal history of nicotine dependence: Secondary | ICD-10-CM | POA: Diagnosis not present

## 2023-04-22 DIAGNOSIS — J45909 Unspecified asthma, uncomplicated: Secondary | ICD-10-CM | POA: Diagnosis not present

## 2023-04-22 HISTORY — PX: LAPAROSCOPIC APPENDECTOMY: SHX408

## 2023-04-22 LAB — MRSA NEXT GEN BY PCR, NASAL: MRSA by PCR Next Gen: NOT DETECTED

## 2023-04-22 SURGERY — APPENDECTOMY, LAPAROSCOPIC
Anesthesia: General

## 2023-04-22 MED ORDER — CHLORHEXIDINE GLUCONATE 0.12 % MT SOLN
OROMUCOSAL | Status: AC
  Administered 2023-04-22: 15 mL
  Filled 2023-04-22: qty 15

## 2023-04-22 MED ORDER — 0.9 % SODIUM CHLORIDE (POUR BTL) OPTIME
TOPICAL | Status: DC | PRN
  Administered 2023-04-22: 1000 mL

## 2023-04-22 MED ORDER — OXYCODONE HCL 5 MG/5ML PO SOLN: 5.0000 mg | Freq: Once | ORAL | Status: DC | PRN

## 2023-04-22 MED ORDER — ONDANSETRON HCL 4 MG/2ML IJ SOLN
INTRAMUSCULAR | Status: DC | PRN
  Administered 2023-04-22: 4 mg via INTRAVENOUS

## 2023-04-22 MED ORDER — FENTANYL CITRATE (PF) 250 MCG/5ML IJ SOLN
INTRAMUSCULAR | Status: DC | PRN
  Administered 2023-04-22 (×3): 50 ug via INTRAVENOUS
  Administered 2023-04-22: 100 ug via INTRAVENOUS

## 2023-04-22 MED ORDER — DEXAMETHASONE SODIUM PHOSPHATE 10 MG/ML IJ SOLN
INTRAMUSCULAR | Status: DC | PRN
  Administered 2023-04-22: 10 mg via INTRAVENOUS

## 2023-04-22 MED ORDER — PHENYLEPHRINE 80 MCG/ML (10ML) SYRINGE FOR IV PUSH (FOR BLOOD PRESSURE SUPPORT)
PREFILLED_SYRINGE | INTRAVENOUS | Status: DC | PRN
  Administered 2023-04-22 (×3): 80 ug via INTRAVENOUS

## 2023-04-22 MED ORDER — HYDROMORPHONE HCL 1 MG/ML IJ SOLN
INTRAMUSCULAR | Status: AC
  Filled 2023-04-22: qty 1

## 2023-04-22 MED ORDER — ACETAMINOPHEN 10 MG/ML IV SOLN
INTRAVENOUS | Status: AC
  Filled 2023-04-22: qty 100

## 2023-04-22 MED ORDER — FENTANYL CITRATE (PF) 100 MCG/2ML IJ SOLN
25.0000 ug | INTRAMUSCULAR | Status: DC | PRN
  Administered 2023-04-22: 25 ug via INTRAVENOUS

## 2023-04-22 MED ORDER — BUPIVACAINE-EPINEPHRINE (PF) 0.25% -1:200000 IJ SOLN
INTRAMUSCULAR | Status: AC
  Filled 2023-04-22: qty 30

## 2023-04-22 MED ORDER — SODIUM CHLORIDE 0.9 % IR SOLN
Status: DC | PRN
  Administered 2023-04-22: 1000 mL

## 2023-04-22 MED ORDER — PROPOFOL 10 MG/ML IV BOLUS
INTRAVENOUS | Status: DC | PRN
  Administered 2023-04-22: 200 mg via INTRAVENOUS

## 2023-04-22 MED ORDER — PROPOFOL 1000 MG/100ML IV EMUL
INTRAVENOUS | Status: AC
  Filled 2023-04-22: qty 100

## 2023-04-22 MED ORDER — BUPIVACAINE-EPINEPHRINE 0.25% -1:200000 IJ SOLN
INTRAMUSCULAR | Status: DC | PRN
  Administered 2023-04-22: 19 mL

## 2023-04-22 MED ORDER — MIDAZOLAM HCL 2 MG/2ML IJ SOLN
INTRAMUSCULAR | Status: DC | PRN
  Administered 2023-04-22: 2 mg via INTRAVENOUS

## 2023-04-22 MED ORDER — FENTANYL CITRATE (PF) 250 MCG/5ML IJ SOLN
INTRAMUSCULAR | Status: AC
  Filled 2023-04-22: qty 5

## 2023-04-22 MED ORDER — TRAMADOL HCL 50 MG PO TABS: 50.0000 mg | ORAL_TABLET | Freq: Four times a day (QID) | ORAL | 0 refills | Status: DC | PRN

## 2023-04-22 MED ORDER — LIDOCAINE 2% (20 MG/ML) 5 ML SYRINGE
INTRAMUSCULAR | Status: DC | PRN
  Administered 2023-04-22: 100 mg via INTRAVENOUS

## 2023-04-22 MED ORDER — ACETAMINOPHEN 10 MG/ML IV SOLN
1000.0000 mg | Freq: Once | INTRAVENOUS | Status: DC | PRN
  Administered 2023-04-22: 1000 mg via INTRAVENOUS

## 2023-04-22 MED ORDER — PROPOFOL 500 MG/50ML IV EMUL
INTRAVENOUS | Status: DC | PRN
  Administered 2023-04-22: 50 ug/kg/min via INTRAVENOUS

## 2023-04-22 MED ORDER — TRAMADOL HCL 50 MG PO TABS
ORAL_TABLET | ORAL | Status: AC
  Filled 2023-04-22: qty 1

## 2023-04-22 MED ORDER — ACETAMINOPHEN 500 MG PO TABS: 1000.0000 mg | ORAL_TABLET | Freq: Four times a day (QID) | ORAL | Status: DC | PRN

## 2023-04-22 MED ORDER — FENTANYL CITRATE (PF) 100 MCG/2ML IJ SOLN
INTRAMUSCULAR | Status: AC
  Filled 2023-04-22: qty 2

## 2023-04-22 MED ORDER — OXYCODONE HCL 5 MG PO TABS: 5.0000 mg | ORAL_TABLET | Freq: Once | ORAL | Status: DC | PRN

## 2023-04-22 MED ORDER — TRAMADOL HCL 50 MG PO TABS
50.0000 mg | ORAL_TABLET | Freq: Four times a day (QID) | ORAL | Status: DC | PRN
  Administered 2023-04-22: 50 mg via ORAL

## 2023-04-22 MED ORDER — CHLORHEXIDINE GLUCONATE 4 % EX SOLN
1.0000 | Freq: Once | CUTANEOUS | Status: AC
  Administered 2023-04-22: 1 via TOPICAL
  Filled 2023-04-22: qty 15

## 2023-04-22 MED ORDER — ROCURONIUM BROMIDE 10 MG/ML (PF) SYRINGE
PREFILLED_SYRINGE | INTRAVENOUS | Status: DC | PRN
  Administered 2023-04-22: 40 mg via INTRAVENOUS

## 2023-04-22 MED ORDER — SUCCINYLCHOLINE CHLORIDE 200 MG/10ML IV SOSY
PREFILLED_SYRINGE | INTRAVENOUS | Status: DC | PRN
  Administered 2023-04-22: 80 mg via INTRAVENOUS

## 2023-04-22 MED ORDER — LACTATED RINGERS IV SOLN: INTRAVENOUS | Status: DC

## 2023-04-22 MED ORDER — IBUPROFEN 600 MG PO TABS: 600.0000 mg | ORAL_TABLET | Freq: Four times a day (QID) | ORAL | 0 refills | Status: DC | PRN

## 2023-04-22 MED ORDER — AMISULPRIDE (ANTIEMETIC) 5 MG/2ML IV SOLN: 10.0000 mg | Freq: Once | INTRAVENOUS | Status: DC | PRN

## 2023-04-22 MED ORDER — CHLORHEXIDINE GLUCONATE 0.12 % MT SOLN
15.0000 mL | OROMUCOSAL | Status: AC
  Filled 2023-04-22: qty 15

## 2023-04-22 MED ORDER — MIDAZOLAM HCL 2 MG/2ML IJ SOLN
INTRAMUSCULAR | Status: AC
  Filled 2023-04-22: qty 2

## 2023-04-22 MED ORDER — HYDROMORPHONE HCL 1 MG/ML IJ SOLN
INTRAMUSCULAR | Status: DC | PRN
  Administered 2023-04-22: 1 mg via INTRAVENOUS

## 2023-04-22 SURGICAL SUPPLY — 45 items
APPLIER CLIP 5 13 M/L LIGAMAX5 (MISCELLANEOUS)
BAG COUNTER SPONGE SURGICOUNT (BAG) ×1 IMPLANT
BLADE CLIPPER SURG (BLADE) IMPLANT
CANISTER SUCT 3000ML PPV (MISCELLANEOUS) ×1 IMPLANT
CHLORAPREP W/TINT 26 (MISCELLANEOUS) ×1 IMPLANT
CLIP APPLIE 5 13 M/L LIGAMAX5 (MISCELLANEOUS) IMPLANT
COVER SURGICAL LIGHT HANDLE (MISCELLANEOUS) ×1 IMPLANT
CUTTER FLEX LINEAR 45M (STAPLE) ×1 IMPLANT
DERMABOND ADVANCED .7 DNX12 (GAUZE/BANDAGES/DRESSINGS) ×1 IMPLANT
DERMABOND ADVANCED .7 DNX6 (GAUZE/BANDAGES/DRESSINGS) IMPLANT
ELECT REM PT RETURN 9FT ADLT (ELECTROSURGICAL) ×1
ELECTRODE REM PT RTRN 9FT ADLT (ELECTROSURGICAL) ×1 IMPLANT
GLOVE BIO SURGEON STRL SZ 6 (GLOVE) ×1 IMPLANT
GLOVE INDICATOR 6.5 STRL GRN (GLOVE) ×1 IMPLANT
GOWN STRL REUS W/ TWL LRG LVL3 (GOWN DISPOSABLE) ×3 IMPLANT
GOWN STRL REUS W/TWL LRG LVL3 (GOWN DISPOSABLE) ×3
GRASPER SUT TROCAR 14GX15 (MISCELLANEOUS) ×1 IMPLANT
IRRIG SUCT STRYKERFLOW 2 WTIP (MISCELLANEOUS) ×1
IRRIGATION SUCT STRKRFLW 2 WTP (MISCELLANEOUS) ×1 IMPLANT
KIT BASIN OR (CUSTOM PROCEDURE TRAY) ×1 IMPLANT
KIT TURNOVER KIT B (KITS) ×1 IMPLANT
NDL INSUFFLATION 14GA 120MM (NEEDLE) ×1 IMPLANT
NEEDLE INSUFFLATION 14GA 120MM (NEEDLE) ×1 IMPLANT
NS IRRIG 1000ML POUR BTL (IV SOLUTION) ×1 IMPLANT
PAD ARMBOARD 7.5X6 YLW CONV (MISCELLANEOUS) ×2 IMPLANT
RELOAD 45 VASCULAR/THIN (ENDOMECHANICALS) IMPLANT
RELOAD STAPLE 45 2.5 WHT GRN (ENDOMECHANICALS) IMPLANT
RELOAD STAPLE 45 3.5 BLU ETS (ENDOMECHANICALS) IMPLANT
RELOAD STAPLE TA45 3.5 REG BLU (ENDOMECHANICALS) IMPLANT
SCISSORS LAP 5X35 DISP (ENDOMECHANICALS) IMPLANT
SET TUBE SMOKE EVAC HIGH FLOW (TUBING) ×1 IMPLANT
SHEARS HARMONIC ACE PLUS 36CM (ENDOMECHANICALS) ×1 IMPLANT
SLEEVE Z-THREAD 5X100MM (TROCAR) ×1 IMPLANT
SPECIMEN JAR SMALL (MISCELLANEOUS) ×1 IMPLANT
SUT MNCRL AB 4-0 PS2 18 (SUTURE) ×1 IMPLANT
SYS BAG RETRIEVAL 10MM (BASKET) ×1
SYSTEM BAG RETRIEVAL 10MM (BASKET) ×1 IMPLANT
TOWEL GREEN STERILE FF (TOWEL DISPOSABLE) ×1 IMPLANT
TRAY FOLEY W/BAG SLVR 16FR (SET/KITS/TRAYS/PACK) ×1
TRAY FOLEY W/BAG SLVR 16FR ST (SET/KITS/TRAYS/PACK) ×1 IMPLANT
TRAY LAPAROSCOPIC MC (CUSTOM PROCEDURE TRAY) ×1 IMPLANT
TROCAR Z THREAD OPTICAL 12X100 (TROCAR) ×1 IMPLANT
TROCAR Z-THREAD OPTICAL 5X100M (TROCAR) ×1 IMPLANT
WARMER LAPAROSCOPE (MISCELLANEOUS) ×1 IMPLANT
WATER STERILE IRR 1000ML POUR (IV SOLUTION) ×1 IMPLANT

## 2023-04-22 NOTE — Op Note (Signed)
Operative Report  Pamela Calhoun 37 y.o. female  [id number]  [id number]  04/22/2023  Surgeon: Phylliss Blakes MD FACS    Procedure performed: Laparoscopic Appendectomy and diagnostic laparoscopy   Preop diagnosis: Acute appendicitis   Post-op diagnosis/intraop findings: Normal-appearing appendix with no intra-abdominal pathology   Specimens: appendix   EBL: minimal   Complications: none   Description of procedure: After obtaining informed consent the patient was brought to the operating room. Antibiotics were administered. SCD's were applied. General endotracheal anesthesia was initiated and a formal time-out was performed. Foley catheter was inserted which is removed at the end of the case. The abdomen was prepped and draped in the usual sterile fashion and the abdomen was entered using an infraumbilical Veress needle and insufflated to 15 mmHg. A 5 mm trocar and camera were then introduced, the abdomen was inspected and there is no evidence of injury from our entry. A suprapubic 5 mm trocar and a left lower quadrant 12 mm trocar were introduced under direct visualization following infiltration with local. The patient was then placed in Trendelenburg and rotated to the left and the small bowel was reflected cephalad. The appendix is found to be normal in appearance and caliber with no appreciable gross inflammation. The mesoappendix was divided with the harmonic scalpel, isolating the base of the appendix. Hemostasis was ensured. A white load 45mm endoGIA stapler was used to transect the appendix from the cecum, taking a cuff of viable cecum with the specimen. The appendix was removed through our 12 mm trocar site. The staple line was reinspected and confirmed to be intact, hemostatic, and viable. The small bowel was run in its entirety from the ileocecal valve to the ligament of Treitz, this was all normal with no diverticuli, mass, stricture, or other gross pathology.  The ascending, transverse  and descending/sigmoid colon and rectum were all inspected and appeared grossly normal although she does have a mildly dilated sigmoid colon consistent with constipation.  The stomach anterior surface, anterior liver and gallbladder are within normal appearance.  No appreciable retroperitoneal pathology in the pelvis, the uterus, fallopian tubes and or free's appear normal and I did not appreciate any evidence of active or prior endometriosis or scarring. The 12mm trocar site in the left lower quadrant was closed with a 0 vicryl in the fascia under direct visualization using a PMI device. The abdomen was desufflated and all trocars removed. The skin incisions were closed with subcuticular 4-0 monocryl and Dermabond. The patient was awakened, extubated and transported to the recovery room in stable condition.    All counts were correct at the completion of the case.

## 2023-04-22 NOTE — Progress Notes (Signed)
Pt discharged to home via wheelchair with family and all belongings and paperwork

## 2023-04-22 NOTE — Anesthesia Procedure Notes (Addendum)
Procedure Name: Intubation Date/Time: 04/22/2023 9:20 AM  Performed by: Darryl Nestle, CRNAPre-anesthesia Checklist: Patient identified, Emergency Drugs available, Suction available and Patient being monitored Patient Re-evaluated:Patient Re-evaluated prior to induction Oxygen Delivery Method: Circle system utilized Preoxygenation: Pre-oxygenation with 100% oxygen Induction Type: IV induction, Rapid sequence and Cricoid Pressure applied Ventilation: Mask ventilation without difficulty Laryngoscope Size: Mac and 3 Grade View: Grade I Tube type: Oral Tube size: 7.0 mm Number of attempts: 1 Airway Equipment and Method: Stylet and Oral airway Placement Confirmation: ETT inserted through vocal cords under direct vision, positive ETCO2 and breath sounds checked- equal and bilateral Secured at: 21 cm Tube secured with: Tape Dental Injury: Teeth and Oropharynx as per pre-operative assessment

## 2023-04-22 NOTE — Progress Notes (Signed)
Lunch tray ordered for pt.

## 2023-04-22 NOTE — Discharge Summary (Signed)
[facility] Discharge Summary   Patient ID: Pamela Calhoun MRN: [id number] DOB/AGE: [birth year] 37 y.o.  Admit date: 04/21/2023 Discharge date: 04/22/2023  Admitting Diagnosis: Acute appendicitis   Discharge Diagnosis S/p laparoscopic appendectomy   Consultants None   Imaging: CT ABDOMEN PELVIS W CONTRAST  Result Date: 04/21/2023 CLINICAL DATA:  Left flank pain. EXAM: CT ABDOMEN AND PELVIS WITH CONTRAST TECHNIQUE: Multidetector CT imaging of the abdomen and pelvis was performed using the standard protocol following bolus administration of intravenous contrast. RADIATION DOSE REDUCTION: This exam was performed according to the departmental dose-optimization program which includes automated exposure control, adjustment of the mA and/or kV according to patient size and/or use of iterative reconstruction technique. CONTRAST:  75mL OMNIPAQUE IOHEXOL 350 MG/ML SOLN COMPARISON:  None Available. FINDINGS: Lower chest: No acute abnormality. Hepatobiliary: No focal liver abnormality is seen. No gallstones, gallbladder wall thickening, or biliary dilatation. Pancreas: Unremarkable. No pancreatic ductal dilatation or surrounding inflammatory changes. Spleen: Normal in size without focal abnormality. Adrenals/Urinary Tract: Adrenal glands are unremarkable. Kidneys are normal in size, without renal calculi or hydronephrosis. Small bilateral simple renal cysts are seen. The urinary bladder is poorly distended and subsequently limited in evaluation. Stomach/Bowel: Stomach is within normal limits. A 7.3 mm diameter fluid-filled retrocecal appendix is seen (axial CT images 72 through 74, CT series 3). This is within the midline of the pelvis adjacent to the anterior aspect of the uterine fundus. A mild amount of periappendiceal inflammatory fat stranding is suspected. No evidence of bowel dilatation. Vascular/Lymphatic: No significant vascular findings are present. No enlarged abdominal or pelvic lymph  nodes. Reproductive: Uterus and bilateral adnexa are unremarkable. Other: No abdominal wall hernia or abnormality. A small amount of posterior pelvic free fluid is seen on the right. Musculoskeletal: No acute or significant osseous findings. IMPRESSION: 1. Findings suspicious for early acute appendicitis. 2. Small amount of posterior pelvic free fluid on the right, likely physiologic. 3. Small bilateral simple renal cysts. No follow-up imaging is recommended. This recommendation follows ACR consensus guidelines: Management of the Incidental Renal Mass on CT: A White Paper of the ACR Incidental Findings Committee. J Am Coll Radiol [phone]. Electronically Signed   By: Aram Candela M.D.   On: 04/21/2023 22:25    Procedures Dr. Phylliss Blakes (04/22/23) - Laparoscopic Appendectomy  Hospital Course:  Patient is a 37 year old female who presented to the ED with abdominal pain.  Workup showed probable appendicitis.  Patient was admitted and underwent procedure listed above.  Tolerated procedure well and was transferred to the floor.  Diet was advanced as tolerated.  On POD0, the patient was voiding well, tolerating diet, ambulating well, pain well controlled, vital signs stable, incisions c/d/i and felt stable for discharge home.  Patient will follow up in our office in 3 weeks and knows to call with questions or concerns. She will call to confirm appointment date/time.     I or a member of my team have reviewed this patient in the Controlled Substance Database.   Allergies as of 04/22/2023       Reactions   Cetirizine Diarrhea   REACTION: diarrhea   Cetirizine Hcl    REACTION: diarrhea   Hydrocodone-acetaminophen Itching   Vicodin [hydrocodone-acetaminophen] Itching        Medication List     TAKE these medications    acetaminophen 500 MG tablet Commonly known as: TYLENOL Take 2 tablets (1,000 mg total) by mouth every 6 (six) hours as needed for mild pain or  fever.    AeroChamber Plus inhaler Use with inhaler   albuterol 108 (90 Base) MCG/ACT inhaler Commonly known as: VENTOLIN HFA Inhale 2 puffs into the lungs every 4 (four) hours as needed for wheezing.   ALPRAZolam 1 MG tablet Commonly known as: XANAX Take 1 mg by mouth daily as needed for anxiety.   amitriptyline 25 MG tablet Commonly known as: ELAVIL Take 25 mg by mouth at bedtime.   baclofen 10 MG tablet Commonly known as: LIORESAL Take 5 mg by mouth 3 (three) times daily.   Blisovi FE 1/20 1-20 MG-MCG tablet Generic drug: norethindrone-ethinyl estradiol-FE Take 1 tablet by mouth daily.   Elmiron 100 MG capsule Generic drug: pentosan polysulfate Take 2 tablets by mouth at bedtime.   fluticasone 50 MCG/ACT nasal spray Commonly known as: FLONASE Place 1 spray into both nostrils in the morning and at bedtime for 7 days.   hydrOXYzine 25 MG tablet Commonly known as: ATARAX Take 1 tablet (25 mg total) every 6 (six) hours by mouth.   ibuprofen 600 MG tablet Commonly known as: ADVIL Take 1 tablet (600 mg total) by mouth every 6 (six) hours as needed for moderate pain.   traMADol 50 MG tablet Commonly known as: ULTRAM Take 1 tablet (50 mg total) by mouth every 6 (six) hours as needed for moderate pain or severe pain.          Follow-up Information     Maczis, Hedda Slade, [place]. Go on 05/13/2023.   Specialty: General Surgery Why: 1:30 PM, please arrive 30 min prior to appointment time to check in. Contact information: [address] [place] [address] [facility] [place] [address] [phone]                 Signed: Juliet Rude , [facility] 04/22/2023, 2:25 PM Please see Amion for pager number during day hours 7:00am-4:30pm

## 2023-04-22 NOTE — Anesthesia Postprocedure Evaluation (Signed)
Anesthesia Post Note  Patient: Pamela Calhoun  Procedure(s) Performed: APPENDECTOMY LAPAROSCOPIC     Patient location during evaluation: PACU Anesthesia Type: General Level of consciousness: awake Pain management: pain level controlled Vital Signs Assessment: post-procedure vital signs reviewed and stable Respiratory status: spontaneous breathing, nonlabored ventilation and respiratory function stable Cardiovascular status: blood pressure returned to baseline and stable Postop Assessment: no apparent nausea or vomiting Anesthetic complications: no   No notable events documented.  Last Vitals:  Vitals:   04/22/23 1030 04/22/23 1045  BP: (!) 111/59 104/64  Pulse: 81 81  Resp: 16 15  Temp:  36.6 C  SpO2: 100% 100%    Last Pain:  Vitals:   04/22/23 0835  TempSrc:   PainSc: 5                  Linton Rump

## 2023-04-22 NOTE — Progress Notes (Signed)
Pt arrived back to 6 north room 3 alert and oriented x4. 4 lap sites with skin glue. All clean,dry and intact. Bed in lowest position, call light in reach. Will continue to monitor pt.

## 2023-04-22 NOTE — Transfer of Care (Signed)
Immediate Anesthesia Transfer of Care Note  Patient: Pamela Calhoun  Procedure(s) Performed: APPENDECTOMY LAPAROSCOPIC  Patient Location: PACU  Anesthesia Type:General  Level of Consciousness: awake, alert , and oriented  Airway & Oxygen Therapy: Patient Spontanous Breathing  Post-op Assessment: Report given to RN and Post -op Vital signs reviewed and stable  Post vital signs: Reviewed and stable  Last Vitals:  Vitals Value Taken Time  BP 118/68 04/22/23 1009  Temp 98   Pulse 88 04/22/23 1011  Resp 19 04/22/23 1011  SpO2 100 % 04/22/23 1011  Vitals shown include unvalidated device data.  Last Pain:  Vitals:   04/22/23 0835  TempSrc:   PainSc: 5       Patients Stated Pain Goal: 0 (04/22/23 0835)  Complications: No notable events documented.

## 2023-04-22 NOTE — Progress Notes (Signed)
Pt transferred to short stay via bed by transportation staff 

## 2023-04-22 NOTE — Progress Notes (Signed)
Discharge instructions given to pt. Pt verbalized understanding of all teaching and had no further questions. 

## 2023-04-22 NOTE — H&P (Signed)
Pamela Calhoun 02-11-1986  [id number].     HPI:  Pamela Calhoun is a 37 yo female who presented to the ED with abdominal pain. She began having abdominal pain about a month ago on the left side and was treated for a UTI. About a week ago she again began having pain on the left side of her abdomen, which has gotten significantly worse over the last few days. It now radiates across the lower abdomen. She has had nausea and vomiting for the last 2 days, and fevers to 101 at home. This prompted her to come to the ED. She denies dysuria and diarrhea. She has been afebrile since arrival. UA was negative. A CT scan showed early acute appendicitis. General surgery was consulted.  Her only prior abdominal surgery is a diagnostic laparoscopy in 2005 for evaluation for endometriosis. She says she has not had any further issues with endometriosis in recent years.  ROS: Review of Systems  Constitutional:  Positive for fever.  Respiratory:  Negative for shortness of breath.   Gastrointestinal:  Positive for abdominal pain, nausea and vomiting. Negative for diarrhea.  Genitourinary:  Negative for dysuria and hematuria.  Neurological:  Negative for weakness.    Family History  Problem Relation Age of Onset   Cancer Maternal Grandmother        breast   Diabetes Mother    Hypertension Mother    Neuropathy Mother        diabetic   Depression Mother    Asthma Father     Past Medical History:  Diagnosis Date   Allergy    Cancer (HCC)    skin- hystory of melanoma    Depression    Endometriosis    Fracture    right foot   Frequent UTI    History of shingles    Interstitial cystitis    Pap smear, abnormal    cryosx    PID (acute pelvic inflammatory disease)    hx of   Ureteral reflux    as a child     Past Surgical History:  Procedure Laterality Date   DILATION AND CURETTAGE OF UTERUS     EXCISION OF SKIN TAG N/A 02/11/2016   Procedure: EXCISION OF ANAL SKIN TAG AN INTERNAL PAPILLA ;   Surgeon: Romie Levee, MD;  Location: [facility];  Service: General;  Laterality: N/A;   LAPAROSCOPY     pelvic    MELANOMA EXCISION     Right leg    TONSILLECTOMY      Social History:  reports that she quit smoking about 11 years ago. Her smoking use included cigarettes. She has never used smokeless tobacco. She reports current alcohol use of about 1.0 standard drink of alcohol per week. She reports that she does not use drugs.  Allergies:  Allergies  Allergen Reactions   Cetirizine Diarrhea    REACTION: diarrhea    Cetirizine Hcl     REACTION: diarrhea   Hydrocodone-Acetaminophen Itching   Vicodin [Hydrocodone-Acetaminophen] Itching    Medications Prior to Admission  Medication Sig Dispense Refill   albuterol (VENTOLIN HFA) 108 (90 Base) MCG/ACT inhaler Inhale 2 puffs into the lungs every 4 (four) hours as needed for wheezing. 18 g 0   ELMIRON 100 MG capsule Take 2 tablets by mouth at bedtime.   11   fluticasone (FLONASE) 50 MCG/ACT nasal spray Place 1 spray into both nostrils in the morning and at bedtime for 7 days. 1 g 0  hydrOXYzine (ATARAX/VISTARIL) 25 MG tablet Take 1 tablet (25 mg total) every 6 (six) hours by mouth. 12 tablet 0   Spacer/Aero-Holding Chambers (AEROCHAMBER PLUS) inhaler Use with inhaler 1 each 2     Physical Exam: Blood pressure 136/79, pulse 98, temperature 97.8 F (36.6 C), temperature source Oral, resp. rate 17, height 5\' 9"  (1.753 m), weight 69.9 kg, last menstrual period 02/05/2023, SpO2 100 %, unknown if currently breastfeeding. General: resting comfortably, appears stated age, no apparent distress Neurological: alert and oriented, no focal deficits, cranial nerves grossly in tact HEENT: normocephalic, atraumatic CV: extremities warm and well-perfused Respiratory: normal work of breathing on room air Abdomen: soft, nondistended, tender to palpation across the lower abdomen but especially in the suprapubic area and  RLQ. Extremities: warm and well-perfused, no deformities, moving all extremities spontaneously Psychiatric: normal mood and affect Skin: warm and dry, no jaundice, no rashes or lesions   Results for orders placed or performed during the hospital encounter of 04/21/23 (from the past 48 hour(s))  Comprehensive metabolic panel     Status: Abnormal   Collection Time: 04/21/23  1:24 PM  Result Value Ref Range   Sodium 136 135 - 145 mmol/L   Potassium 4.2 3.5 - 5.1 mmol/L   Chloride 103 98 - 111 mmol/L   CO2 23 22 - 32 mmol/L   Glucose, Bld 101 (H) 70 - 99 mg/dL    Comment: Glucose reference range applies only to samples taken after fasting for at least 8 hours.   BUN 11 6 - 20 mg/dL   Creatinine, Ser 4.09 0.44 - 1.00 mg/dL   Calcium 9.5 8.9 - 81.1 mg/dL   Total Protein 7.6 6.5 - 8.1 g/dL   Albumin 3.9 3.5 - 5.0 g/dL   AST 16 15 - 41 U/L   ALT 14 0 - 44 U/L   Alkaline Phosphatase 66 38 - 126 U/L   Total Bilirubin 0.3 0.3 - 1.2 mg/dL   GFR, Estimated >91 >47 mL/min    Comment: (NOTE) Calculated using the CKD-EPI Creatinine Equation (2021)    Anion gap 10 5 - 15    Comment: Performed at [facility], [address], [place], [place] [address]  CBC     Status: None   Collection Time: 04/21/23  1:24 PM  Result Value Ref Range   WBC 10.4 4.0 - 10.5 K/uL   RBC 4.58 3.87 - 5.11 MIL/uL   Hemoglobin 13.5 12.0 - 15.0 g/dL   HCT 21.3 08.6 - 57.8 %   MCV 90.0 80.0 - 100.0 fL   MCH 29.5 26.0 - 34.0 pg   MCHC 32.8 30.0 - 36.0 g/dL   RDW 46.9 62.9 - 52.8 %   Platelets 311 150 - 400 K/uL   nRBC 0.0 0.0 - 0.2 %    Comment: Performed at [facility], [address] [place], [place] [address]  I-Stat beta hCG blood, ED ([facility], [facility], [facility] only)     Status: None   Collection Time: 04/21/23  1:30 PM  Result Value Ref Range   I-stat hCG, quantitative <5.0 <5 mIU/mL   Comment 3            Comment:   GEST. AGE      CONC.  (mIU/mL)   <=1 WEEK        5 - 50     2 WEEKS       50 - 500     3  WEEKS  100 - 10,000     4 WEEKS     1,000 - 30,000        FEMALE AND NON-PREGNANT FEMALE:     LESS THAN 5 mIU/mL   Urinalysis, Routine w reflex microscopic -Urine, Clean Catch     Status: Abnormal   Collection Time: 04/21/23  2:58 PM  Result Value Ref Range   Color, Urine YELLOW YELLOW   APPearance HAZY (A) CLEAR   Specific Gravity, Urine 1.020 1.005 - 1.030   pH 5.0 5.0 - 8.0   Glucose, UA NEGATIVE NEGATIVE mg/dL   Hgb urine dipstick SMALL (A) NEGATIVE   Bilirubin Urine NEGATIVE NEGATIVE   Ketones, ur NEGATIVE NEGATIVE mg/dL   Protein, ur NEGATIVE NEGATIVE mg/dL   Nitrite NEGATIVE NEGATIVE   Leukocytes,Ua NEGATIVE NEGATIVE   RBC / HPF 0-5 0 - 5 RBC/hpf   WBC, UA 0-5 0 - 5 WBC/hpf   Bacteria, UA RARE (A) NONE SEEN   Squamous Epithelial / HPF 21-50 0 - 5 /HPF   Mucus PRESENT     Comment: Performed at [facility], [address] [place], [place] [address]   CT ABDOMEN PELVIS W CONTRAST  Result Date: 04/21/2023 CLINICAL DATA:  Left flank pain. EXAM: CT ABDOMEN AND PELVIS WITH CONTRAST TECHNIQUE: Multidetector CT imaging of the abdomen and pelvis was performed using the standard protocol following bolus administration of intravenous contrast. RADIATION DOSE REDUCTION: This exam was performed according to the departmental dose-optimization program which includes automated exposure control, adjustment of the mA and/or kV according to patient size and/or use of iterative reconstruction technique. CONTRAST:  75mL OMNIPAQUE IOHEXOL 350 MG/ML SOLN COMPARISON:  None Available. FINDINGS: Lower chest: No acute abnormality. Hepatobiliary: No focal liver abnormality is seen. No gallstones, gallbladder wall thickening, or biliary dilatation. Pancreas: Unremarkable. No pancreatic ductal dilatation or surrounding inflammatory changes. Spleen: Normal in size without focal abnormality. Adrenals/Urinary Tract: Adrenal glands are unremarkable. Kidneys are normal in size, without renal calculi or  hydronephrosis. Small bilateral simple renal cysts are seen. The urinary bladder is poorly distended and subsequently limited in evaluation. Stomach/Bowel: Stomach is within normal limits. A 7.3 mm diameter fluid-filled retrocecal appendix is seen (axial CT images 72 through 74, CT series 3). This is within the midline of the pelvis adjacent to the anterior aspect of the uterine fundus. A mild amount of periappendiceal inflammatory fat stranding is suspected. No evidence of bowel dilatation. Vascular/Lymphatic: No significant vascular findings are present. No enlarged abdominal or pelvic lymph nodes. Reproductive: Uterus and bilateral adnexa are unremarkable. Other: No abdominal wall hernia or abnormality. A small amount of posterior pelvic free fluid is seen on the right. Musculoskeletal: No acute or significant osseous findings. IMPRESSION: 1. Findings suspicious for early acute appendicitis. 2. Small amount of posterior pelvic free fluid on the right, likely physiologic. 3. Small bilateral simple renal cysts. No follow-up imaging is recommended. This recommendation follows ACR consensus guidelines: Management of the Incidental Renal Mass on CT: A White Paper of the ACR Incidental Findings Committee. J Am Coll Radiol [phone]. Electronically Signed   By: Aram Candela M.D.   On: 04/21/2023 22:25      Assessment/Plan 37 yo female presenting with fevers and acute abdominal pain. I personally reviewed her labs, imaging and notes. The location of her reported pain is unusual for appendicitis, however on exam she is focally more tender in the RLQ and suprapubic area. Her appendix is located in the pelvis at midline, which is most likely  why much of her pain radiates to the left side. Her appendiceal inflammation appears mild, but there is no other clear etiology for her pain. I recommended proceeding with appendectomy. - NPO, maintenance IV fluids - Antibiotics: ceftriaxone and flagyl - Pain and  nausea control - Plan for laparoscopic appendectomy later today with Dr. Fredricka Bonine. I reviewed the procedure details with the patient. - VTE: lovenox, SCDs - Dispo: admit to observation   Sophronia Simas, MD [facility] Surgery General, Hepatobiliary and Pancreatic Surgery 04/22/23 2:51 AM

## 2023-04-22 NOTE — Anesthesia Preprocedure Evaluation (Addendum)
Anesthesia Evaluation  Patient identified by MRN, date of birth, ID band Patient awake    Reviewed: Allergy & Precautions, NPO status , Patient's Chart, lab work & pertinent test results  History of Anesthesia Complications Negative for: history of anesthetic complications  Airway Mallampati: I  TM Distance: >3 FB Neck ROM: Full    Dental  (+) Dental Advisory Given   Pulmonary neg shortness of breath, asthma (seasonal, last inhaler use >1 month ago) , neg sleep apnea, neg COPD, neg recent URI, former smoker   Pulmonary exam normal breath sounds clear to auscultation       Cardiovascular (-) hypertension(-) angina (-) Past MI, (-) Cardiac Stents and (-) CABG negative cardio ROS (-) dysrhythmias  Rhythm:Regular Rate:Normal     Neuro/Psych neg Seizures PSYCHIATRIC DISORDERS  Depression    BPPV    GI/Hepatic Neg liver ROS,neg GERD  ,,appendicitis   Endo/Other  negative endocrine ROS    Renal/GU negative Renal ROS     Musculoskeletal   Abdominal   Peds  Hematology negative hematology ROS (+)   Anesthesia Other Findings   Reproductive/Obstetrics endometriosis                             Anesthesia Physical Anesthesia Plan  ASA: 2  Anesthesia Plan: General   Post-op Pain Management:    Induction: Intravenous and Rapid sequence  PONV Risk Score and Plan: 3 and Ondansetron, Dexamethasone and Treatment may vary due to age or medical condition  Airway Management Planned: Oral ETT  Additional Equipment:   Intra-op Plan:   Post-operative Plan: Extubation in OR  Informed Consent: I have reviewed the patients History and Physical, chart, labs and discussed the procedure including the risks, benefits and alternatives for the proposed anesthesia with the patient or authorized representative who has indicated his/her understanding and acceptance.     Dental advisory given  Plan  Discussed with: CRNA and Anesthesiologist  Anesthesia Plan Comments: (Risks of general anesthesia discussed including, but not limited to, sore throat, hoarse voice, chipped/damaged teeth, injury to vocal cords, nausea and vomiting, allergic reactions, lung infection, heart attack, stroke, and death. All questions answered. )        Anesthesia Quick Evaluation

## 2023-04-22 NOTE — Progress Notes (Signed)
Report called to short stay ([phone]) nurse Koleen Nimrod verbalized understanding of report and had no further questions. All patients undergarments removed, consent signed and in chart, pre op checklist complete, CHG bath complete.

## 2023-04-22 NOTE — Discharge Instructions (Signed)

## 2023-04-22 NOTE — Interval H&P Note (Signed)
History and Physical Interval Note:  04/22/2023 8:38 AM  Pamela Calhoun  has presented today for surgery, with the diagnosis of appendicitis.  The various methods of treatment have been discussed with the patient and family. After consideration of risks, benefits and other options for treatment, the patient has consented to  Procedure(s): APPENDECTOMY LAPAROSCOPIC (N/A) as a surgical intervention.  The patient's history has been reviewed, patient examined, no change in status, stable for surgery.  I have reviewed the patient's chart and labs.  Questions were answered to the patient's satisfaction.     Holdyn Poyser Lollie Sails

## 2023-04-23 ENCOUNTER — Encounter (HOSPITAL_COMMUNITY): Payer: Self-pay | Admitting: Surgery

## 2023-04-23 ENCOUNTER — Telehealth: Payer: Self-pay

## 2023-04-23 LAB — SURGICAL PATHOLOGY

## 2023-04-23 NOTE — Transitions of Care (Post Inpatient/ED Visit) (Signed)
   04/23/2023  Name: Pamela Calhoun MRN: [id number] DOB: 10/16/1986  Today's TOC FU Call Status: Today's TOC FU Call Status:: Unsuccessul Call (1st Attempt) Unsuccessful Call (1st Attempt) Date: 04/23/23  Attempted to reach the patient regarding the most recent Inpatient/ED visit.  Follow Up Plan: Additional outreach attempts will be made to reach the patient to complete the Transitions of Care (Post Inpatient/ED visit) call.   Signature Vicente Males, LPN [facility] AWV Team Direct Dial: [phone]

## 2023-06-24 ENCOUNTER — Ambulatory Visit: Payer: Medicaid Other | Admitting: Family Medicine

## 2023-06-24 NOTE — Progress Notes (Unsigned)
Subjective:    Patient ID: Pamela Calhoun, female    DOB: 12/03/1985, 37 y.o.   MRN: [id number]  HPI  Wt Readings from Last 3 Encounters:  04/22/23 155 lb (70.3 kg)  11/21/22 161 lb 6 oz (73.2 kg)  11/18/22 161 lb 8 oz (73.3 kg)      There were no vitals filed for this visit.   Pt presents for multiple medical issues    Had appendectomy in May   Has chronic IC    Patient Active Problem List   Diagnosis Date Noted   Acute appendicitis 04/21/2023   Pregnancy of unknown anatomic location 11/19/2022   Extrinsic asthma 11/21/2021   Acute recurrent maxillary sinusitis 11/21/2021   Otalgia of both ears 07/03/2020   Benign paroxysmal positional vertigo 07/03/2020   Malignant melanoma (HCC) 07/25/2019   Atypical squamous cells of undetermined significance on cytologic smear of cervix (ASC-US) 06/01/2018   Hydradenitis 10/26/2017   Viral URI with cough 01/15/2016   Screen for STD (sexually transmitted disease) 01/23/2015   Acute bronchitis 12/24/2014   MRSA (methicillin resistant Staphylococcus aureus) carrier 04/12/2014   INTERSTITIAL CYSTITIS 06/25/2010   Allergic rhinitis 03/22/2007   Personal history of other malignant neoplasm of skin 03/22/2007   Past Medical History:  Diagnosis Date   Allergy    Cancer (HCC)    skin- hystory of melanoma    Depression    Endometriosis    Fracture    right foot   Frequent UTI    History of shingles    Interstitial cystitis    Pap smear, abnormal    cryosx    PID (acute pelvic inflammatory disease)    hx of   Ureteral reflux    as a child    Past Surgical History:  Procedure Laterality Date   DILATION AND CURETTAGE OF UTERUS     EXCISION OF SKIN TAG N/A 02/11/2016   Procedure: EXCISION OF ANAL SKIN TAG AN INTERNAL PAPILLA ;  Surgeon: Romie Levee, MD;  Location: [facility];  Service: General;  Laterality: N/A;   LAPAROSCOPIC APPENDECTOMY N/A 04/22/2023   Procedure: APPENDECTOMY LAPAROSCOPIC;  Surgeon:  Berna Bue, MD;  Location: [facility] OR;  Service: General;  Laterality: N/A;   LAPAROSCOPY     pelvic    MELANOMA EXCISION     Right leg    TONSILLECTOMY     Social History   Tobacco Use   Smoking status: Former    Current packs/day: 0.00    Types: Cigarettes    Quit date: 02/04/2012    Years since quitting: 11.3   Smokeless tobacco: Never  Vaping Use   Vaping status: Never Used  Substance Use Topics   Alcohol use: Yes    Alcohol/week: 1.0 standard drink of alcohol    Types: 1 Standard drinks or equivalent per week    Comment: occasionally   Drug use: No   Family History  Problem Relation Age of Onset   Cancer Maternal Grandmother        breast   Diabetes Mother    Hypertension Mother    Neuropathy Mother        diabetic   Depression Mother    Asthma Father    Allergies  Allergen Reactions   Cetirizine Diarrhea    REACTION: diarrhea    Cetirizine Hcl     REACTION: diarrhea   Hydrocodone-Acetaminophen Itching   Vicodin [Hydrocodone-Acetaminophen] Itching   Current Outpatient Medications on File Prior to Visit  Medication Sig Dispense Refill   acetaminophen (TYLENOL) 500 MG tablet Take 2 tablets (1,000 mg total) by mouth every 6 (six) hours as needed for mild pain or fever.     albuterol (VENTOLIN HFA) 108 (90 Base) MCG/ACT inhaler Inhale 2 puffs into the lungs every 4 (four) hours as needed for wheezing. 18 g 0   ALPRAZolam (XANAX) 1 MG tablet Take 1 mg by mouth daily as needed for anxiety.     amitriptyline (ELAVIL) 25 MG tablet Take 25 mg by mouth at bedtime.     baclofen (LIORESAL) 10 MG tablet Take 5 mg by mouth 3 (three) times daily.     BLISOVI FE 1/20 1-20 MG-MCG tablet Take 1 tablet by mouth daily.     ELMIRON 100 MG capsule Take 2 tablets by mouth at bedtime.   11   fluticasone (FLONASE) 50 MCG/ACT nasal spray Place 1 spray into both nostrils in the morning and at bedtime for 7 days. 1 g 0   hydrOXYzine (ATARAX/VISTARIL) 25 MG tablet Take 1 tablet (25  mg total) every 6 (six) hours by mouth. 12 tablet 0   ibuprofen (ADVIL) 600 MG tablet Take 1 tablet (600 mg total) by mouth every 6 (six) hours as needed for moderate pain. 30 tablet 0   Spacer/Aero-Holding Chambers (AEROCHAMBER PLUS) inhaler Use with inhaler (Patient not taking: Reported on 04/22/2023) 1 each 2   traMADol (ULTRAM) 50 MG tablet Take 1 tablet (50 mg total) by mouth every 6 (six) hours as needed for moderate pain or severe pain. 15 tablet 0   [DISCONTINUED] dicyclomine (BENTYL) 20 MG tablet Take 1 tablet (20 mg total) by mouth 2 (two) times daily. 20 tablet 0   No current facility-administered medications on file prior to visit.    Review of Systems     Objective:   Physical Exam        Assessment & Plan:   Problem List Items Addressed This Visit   None

## 2023-08-10 ENCOUNTER — Ambulatory Visit (INDEPENDENT_AMBULATORY_CARE_PROVIDER_SITE_OTHER): Payer: Medicaid Other | Admitting: Gastroenterology

## 2023-08-10 ENCOUNTER — Encounter: Payer: Self-pay | Admitting: Gastroenterology

## 2023-08-10 VITALS — BP 110/80 | HR 100 | Ht 70.0 in | Wt 162.2 lb

## 2023-08-10 DIAGNOSIS — R109 Unspecified abdominal pain: Secondary | ICD-10-CM

## 2023-08-10 DIAGNOSIS — N3010 Interstitial cystitis (chronic) without hematuria: Secondary | ICD-10-CM | POA: Diagnosis not present

## 2023-08-10 NOTE — Patient Instructions (Signed)
_______________________________________________________  If your blood pressure at your visit was 140/90 or greater, please contact your primary care physician to follow up on this.  _______________________________________________________  If you are age 39 or older, your body mass index should be between 23-30. Your Body mass index is 23.27 kg/m. If this is out of the aforementioned range listed, please consider follow up with your Primary Care Provider.  If you are age 49 or younger, your body mass index should be between 19-25. Your Body mass index is 23.27 kg/m. If this is out of the aformentioned range listed, please consider follow up with your Primary Care Provider.   ________________________________________________________  The [place] [facility] providers would like to encourage you to use [facility] to communicate with providers for non-urgent requests or questions.  Due to long hold times on the telephone, sending your provider a message by [facility] may be a faster and more efficient way to get a response.  Please allow 48 business hours for a response.  Please remember that this is for non-urgent requests.  _______________________________________________________  Follow up as needed.  It was a pleasure to see you today!  Vito Cirigliano, D.O.
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
## 2024-01-01 ENCOUNTER — Ambulatory Visit (HOSPITAL_BASED_OUTPATIENT_CLINIC_OR_DEPARTMENT_OTHER): Payer: Medicaid Other | Admitting: Psychiatry

## 2024-01-01 ENCOUNTER — Encounter (HOSPITAL_COMMUNITY): Payer: Self-pay | Admitting: Psychiatry

## 2024-01-01 DIAGNOSIS — F332 Major depressive disorder, recurrent severe without psychotic features: Secondary | ICD-10-CM | POA: Diagnosis not present

## 2024-01-01 DIAGNOSIS — F411 Generalized anxiety disorder: Secondary | ICD-10-CM

## 2024-01-01 MED ORDER — ESCITALOPRAM OXALATE 20 MG PO TABS: 20.0000 mg | ORAL_TABLET | Freq: Every day | ORAL | 2 refills | Status: DC

## 2024-01-01 MED ORDER — BUPROPION HCL ER (XL) 150 MG PO TB24: 150.0000 mg | ORAL_TABLET | Freq: Every day | ORAL | 2 refills | Status: DC
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
## 2024-01-03 ENCOUNTER — Telehealth (HOSPITAL_COMMUNITY): Payer: Self-pay | Admitting: Professional

## 2024-01-04 ENCOUNTER — Telehealth (HOSPITAL_COMMUNITY): Payer: Self-pay

## 2024-01-04 NOTE — Telephone Encounter (Signed)
Called pt to schedule 4 week f/u with Dr Tenny Craw no answer no vm

## 2024-01-05 ENCOUNTER — Telehealth (HOSPITAL_COMMUNITY): Payer: Self-pay | Admitting: Professional

## 2024-01-11 ENCOUNTER — Telehealth (HOSPITAL_COMMUNITY): Payer: Self-pay | Admitting: Professional

## 2024-01-19 ENCOUNTER — Telehealth (HOSPITAL_COMMUNITY): Payer: Self-pay | Admitting: Professional

## 2024-02-21 ENCOUNTER — Encounter (HOSPITAL_COMMUNITY): Payer: Self-pay

## 2024-03-10 ENCOUNTER — Telehealth (INDEPENDENT_AMBULATORY_CARE_PROVIDER_SITE_OTHER): Payer: Self-pay | Admitting: Psychiatry

## 2024-03-10 DIAGNOSIS — Z91199 Patient's noncompliance with other medical treatment and regimen due to unspecified reason: Secondary | ICD-10-CM

## 2024-03-13 NOTE — Progress Notes (Signed)
 No show

## 2024-03-17 ENCOUNTER — Encounter (HOSPITAL_COMMUNITY): Payer: Self-pay | Admitting: Psychiatry

## 2024-03-17 ENCOUNTER — Telehealth (HOSPITAL_COMMUNITY): Admitting: Psychiatry

## 2024-03-17 DIAGNOSIS — F411 Generalized anxiety disorder: Secondary | ICD-10-CM | POA: Diagnosis not present

## 2024-03-17 DIAGNOSIS — F332 Major depressive disorder, recurrent severe without psychotic features: Secondary | ICD-10-CM

## 2024-03-17 MED ORDER — ESCITALOPRAM OXALATE 20 MG PO TABS: 20.0000 mg | ORAL_TABLET | Freq: Every day | ORAL | 2 refills | Status: DC

## 2024-03-17 MED ORDER — BUPROPION HCL ER (XL) 150 MG PO TB24: 150.0000 mg | ORAL_TABLET | Freq: Every day | ORAL | 2 refills | Status: DC

## 2024-03-17 MED ORDER — CLONAZEPAM 0.5 MG PO TABS: 0.5000 mg | ORAL_TABLET | Freq: Two times a day (BID) | ORAL | 2 refills | Status: DC | PRN

## 2024-03-17 MED ORDER — HYDROXYZINE HCL 25 MG PO TABS: 25.0000 mg | ORAL_TABLET | Freq: Four times a day (QID) | ORAL | 2 refills | Status: DC | PRN
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
## 2024-04-03 ENCOUNTER — Telehealth (HOSPITAL_COMMUNITY): Payer: Self-pay | Admitting: Professional

## 2024-04-03 ENCOUNTER — Telehealth (HOSPITAL_COMMUNITY): Payer: Self-pay | Admitting: Psychiatry

## 2024-04-03 NOTE — Telephone Encounter (Signed)
 D:  Pt had left vm stating that Dr. Avanell Bob had referred her to [facility] and it was urgent that the case manager returned her call.  A:  Placed call to pt.  Informed pt that [facility] doesn't take her insurance (Medicaid).  Provided pt with PHP's phone #.  R:  Pt receptive.

## 2024-04-04 ENCOUNTER — Ambulatory Visit (HOSPITAL_COMMUNITY)

## 2024-04-04 ENCOUNTER — Telehealth (HOSPITAL_COMMUNITY): Payer: Self-pay | Admitting: Professional

## 2024-04-05 ENCOUNTER — Encounter (HOSPITAL_COMMUNITY): Payer: Self-pay

## 2024-04-05 ENCOUNTER — Ambulatory Visit (HOSPITAL_COMMUNITY): Admitting: Professional

## 2024-04-05 DIAGNOSIS — F332 Major depressive disorder, recurrent severe without psychotic features: Secondary | ICD-10-CM | POA: Insufficient documentation

## 2024-04-05 DIAGNOSIS — F411 Generalized anxiety disorder: Secondary | ICD-10-CM | POA: Insufficient documentation

## 2024-04-06 ENCOUNTER — Telehealth (HOSPITAL_COMMUNITY): Payer: Self-pay

## 2024-04-06 ENCOUNTER — Other Ambulatory Visit (HOSPITAL_COMMUNITY): Payer: Self-pay | Admitting: Psychiatry

## 2024-04-06 MED ORDER — ESCITALOPRAM OXALATE 20 MG PO TABS: 20.0000 mg | ORAL_TABLET | Freq: Every day | ORAL | 2 refills | Status: DC

## 2024-04-06 MED ORDER — BUPROPION HCL ER (XL) 300 MG PO TB24: 300.0000 mg | ORAL_TABLET | Freq: Every day | ORAL | 0 refills | Status: DC

## 2024-04-06 NOTE — Telephone Encounter (Signed)
 Pt called in stating that she forgot to tell Dr Avanell Bob that another Dr she is working with increased her wellbutrin  to 300mg  once a day and her lexapro  to 40 mg two tablets at night

## 2024-04-06 NOTE — Telephone Encounter (Signed)
Pt aware.

## 2024-04-06 NOTE — Telephone Encounter (Signed)
 I sent in what I could ascertain from the pharmacy record which is Lexapro  20 mg daily and Wellbutrin  XL 300 mg daily. She needs to make an appt

## 2024-04-06 NOTE — Telephone Encounter (Signed)
 Spoke with pt advised of Dr Luanne Runner message pt is saying that she is needing the medication with the changes because she is out. Pt is going to work on getting the records sent over from when the med dosage was changed but not sure it will be here by tomorrow and she is out of medication. Pt wants to continue seeing Dr Avanell Bob be she is her psychiatrist. Please advise.

## 2024-04-06 NOTE — Telephone Encounter (Signed)
 I don't see any of this in the chart. However if another physician is managing her mental health meds she will need to decide if she wants to see that MD or continue to see me. Can't do both

## 2024-04-10 ENCOUNTER — Ambulatory Visit (INDEPENDENT_AMBULATORY_CARE_PROVIDER_SITE_OTHER): Admitting: Licensed Clinical Social Worker

## 2024-04-10 DIAGNOSIS — F332 Major depressive disorder, recurrent severe without psychotic features: Secondary | ICD-10-CM | POA: Diagnosis not present
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
## 2024-04-11 ENCOUNTER — Ambulatory Visit (INDEPENDENT_AMBULATORY_CARE_PROVIDER_SITE_OTHER): Admitting: Licensed Clinical Social Worker

## 2024-04-11 DIAGNOSIS — F332 Major depressive disorder, recurrent severe without psychotic features: Secondary | ICD-10-CM

## 2024-04-11 DIAGNOSIS — F411 Generalized anxiety disorder: Secondary | ICD-10-CM

## 2024-04-12 ENCOUNTER — Ambulatory Visit (INDEPENDENT_AMBULATORY_CARE_PROVIDER_SITE_OTHER): Admitting: Licensed Clinical Social Worker

## 2024-04-12 DIAGNOSIS — F411 Generalized anxiety disorder: Secondary | ICD-10-CM

## 2024-04-12 DIAGNOSIS — F332 Major depressive disorder, recurrent severe without psychotic features: Secondary | ICD-10-CM | POA: Diagnosis not present

## 2024-04-13 ENCOUNTER — Ambulatory Visit (INDEPENDENT_AMBULATORY_CARE_PROVIDER_SITE_OTHER): Admitting: Licensed Clinical Social Worker

## 2024-04-13 DIAGNOSIS — F411 Generalized anxiety disorder: Secondary | ICD-10-CM

## 2024-04-13 DIAGNOSIS — F332 Major depressive disorder, recurrent severe without psychotic features: Secondary | ICD-10-CM | POA: Diagnosis not present

## 2024-04-14 ENCOUNTER — Encounter (HOSPITAL_COMMUNITY): Payer: Self-pay | Admitting: Psychiatry

## 2024-04-14 ENCOUNTER — Encounter (HOSPITAL_COMMUNITY): Payer: Self-pay

## 2024-04-14 ENCOUNTER — Ambulatory Visit (HOSPITAL_COMMUNITY)

## 2024-04-14 ENCOUNTER — Telehealth (INDEPENDENT_AMBULATORY_CARE_PROVIDER_SITE_OTHER): Admitting: Psychiatry

## 2024-04-14 DIAGNOSIS — F332 Major depressive disorder, recurrent severe without psychotic features: Secondary | ICD-10-CM | POA: Diagnosis not present

## 2024-04-14 DIAGNOSIS — F411 Generalized anxiety disorder: Secondary | ICD-10-CM

## 2024-04-14 MED ORDER — CLONAZEPAM 0.5 MG PO TABS: 0.5000 mg | ORAL_TABLET | Freq: Two times a day (BID) | ORAL | 2 refills | Status: DC | PRN

## 2024-04-14 MED ORDER — BUPROPION HCL ER (XL) 300 MG PO TB24: 300.0000 mg | ORAL_TABLET | Freq: Every day | ORAL | 0 refills | Status: DC

## 2024-04-14 MED ORDER — ESCITALOPRAM OXALATE 20 MG PO TABS: 20.0000 mg | ORAL_TABLET | Freq: Two times a day (BID) | ORAL | 2 refills | Status: DC
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
## 2024-04-18 ENCOUNTER — Ambulatory Visit (INDEPENDENT_AMBULATORY_CARE_PROVIDER_SITE_OTHER): Admitting: Licensed Clinical Social Worker

## 2024-04-18 DIAGNOSIS — F332 Major depressive disorder, recurrent severe without psychotic features: Secondary | ICD-10-CM | POA: Diagnosis not present

## 2024-04-18 DIAGNOSIS — F411 Generalized anxiety disorder: Secondary | ICD-10-CM

## 2024-04-19 ENCOUNTER — Encounter (HOSPITAL_COMMUNITY): Payer: Self-pay

## 2024-04-19 ENCOUNTER — Ambulatory Visit (HOSPITAL_COMMUNITY)

## 2024-04-20 ENCOUNTER — Ambulatory Visit (INDEPENDENT_AMBULATORY_CARE_PROVIDER_SITE_OTHER): Admitting: Licensed Clinical Social Worker

## 2024-04-20 DIAGNOSIS — F411 Generalized anxiety disorder: Secondary | ICD-10-CM

## 2024-04-20 DIAGNOSIS — F332 Major depressive disorder, recurrent severe without psychotic features: Secondary | ICD-10-CM

## 2024-04-20 NOTE — Psych (Signed)
 Virtual Visit via Video Note  I connected with Pamela Calhoun on 04/20/24 at  9:00 AM EDT by a video enabled telemedicine application and verified that I am speaking with the correct person using two identifiers.  Location: Patient: pt's home in [place], [place] Provider: clinical home office in [place], [place]   I discussed the limitations of evaluation and management by telemedicine and the availability of in person appointments. The patient expressed understanding and agreed to proceed.   I discussed the assessment and treatment plan with the patient. The patient was provided an opportunity to ask questions and all were answered. The patient agreed with the plan and demonstrated an understanding of the instructions.   The patient was advised to call back or seek an in-person evaluation if the symptoms worsen or if the condition fails to improve as anticipated.  I provided 240 minutes of non-face-to-face time during this encounter.   Pamela Deputy, LCSW    [facility] BH PHP THERAPIST PROGRESS NOTE  JAALA Calhoun [id number]   Session Time: 9:00 am - 10:00 am  Participation Level: Minimal  Behavioral Response: CasualAlertDepressed  Type of Therapy: Group Therapy  Treatment Goals addressed: Coping  Progress Towards Goals: Not Progressing  Interventions: CBT, DBT, Solution Focused, Strength-based, Supportive, and Reframing  Therapist Response: Clinician led check-in regarding current stressors and situation, and review of patient completed daily inventory. Clinician utilized active listening and empathetic response and validated patient emotions. Clinician facilitated processing group on pertinent issues.?   Summary: Patient arrived within time allowed. Patient rates her mood at a 2 on a scale of 1-10 with 10 being best. Pt reported, "still running a fever, don't feel good." When asked about sleep and appetite, pt reports she slept throughout the day and slept poorly overnight,  and ate 2 meals yesterday. Pt denied experiencing SI/SH thoughts since last session. Pt able to process.?Pt engaged in discussion when prompted.?      Session Time: 10:00 am - 11:00 am  Participation Level: Minimal  Behavioral Response: CasualAlertDepressed  Type of Therapy: Group Therapy  Treatment Goals addressed: Coping  Progress Towards Goals: Not Progressing  Interventions: CBT, DBT, Solution Focused, Strength-based, Supportive, and Reframing  Therapist Response: Clinician led group on hopelessness and trying to cultivate hope for the future. Clinician utilized ACT principles to inform discussion.   Summary: Pt engaged in discussion when prompted. She reports she is grumpy today due to feeling sick. She states she is listening but does not feel like participating. She declines to leave group. She requires multiple reminders to remain visible and on camera.    Session Time: 11:00 am - 12:00 pm  Participation Level: None  Behavioral Response: CasualAlertDepressed  Type of Therapy: Group Therapy  Treatment Goals addressed: Coping  Progress Towards Goals: Not Progressing  Interventions: CBT, DBT, Solution Focused, Strength-based, Supportive, and Reframing  Therapist Response: Clinician guided patients through an exercise called "perspective-taking" in which patients were asked to visualize themselves holding onto something that is currently causing them pain and to think of it as a memory. Patients were invited to share their thoughts and feelings after the exercise was completed. Clinician utilized ACT principles to inform discussion.   Summary: Pt did not engage in discussion and did not have anything to share when called on.   Session Time: 12:00 pm - 1:00 pm  Participation Level: None  Behavioral Response: CasualAlertDepressed  Type of Therapy: Group Therapy  Treatment Goals addressed: Coping  Progress Towards Goals: Not Progressing  Interventions: CBT,  DBT, Solution Focused, Strength-based, Supportive, and Reframing  Therapist Response: 12:00 - 12:50 pm: Group was led by occupational therapist, Rockne Chyle. 12:50 - 1:00 pm: Clinician led check-out. Clinician assessed for immediate needs, medication compliance and efficacy, and safety concerns?  Summary: 12:00 - 12:50 pm: Pt did not participate in discussion. 12:50 - 1:00 pm: At check-out, patient contracts for safety.?Patient demonstrates no progress during today's session due to her lack of participation. Patient denies SI/HI/self-harm thoughts at the end of group and agrees to seek help should those thoughts/feelings occur.?   Suicidal/Homicidal: Nowithout intent/plan  Plan: ?Pt will continue in PHP and medication management while continuing to work on decreasing depression symptoms,?SI, and anxiety symptoms,?and increasing the ability to self manage symptoms.    Collaboration of Care: Other none required for this visit  Patient/Guardian was advised Release of Information must be obtained prior to any record release in order to collaborate their care with an outside provider. Patient/Guardian was advised if they have not already done so to contact the registration department to sign all necessary forms in order for us  to release information regarding their care.   Consent: Patient/Guardian gives verbal consent for treatment and assignment of benefits for services provided during this visit. Patient/Guardian expressed understanding and agreed to proceed.   Diagnosis: GAD (generalized anxiety disorder) [F41.1]    1. GAD (generalized anxiety disorder)   2. Severe episode of recurrent major depressive disorder, without psychotic features [facility])       Pamela Deputy, LCSW 04/20/2024

## 2024-04-21 ENCOUNTER — Encounter (HOSPITAL_COMMUNITY): Payer: Self-pay

## 2024-04-21 ENCOUNTER — Ambulatory Visit (HOSPITAL_COMMUNITY)

## 2024-04-22 NOTE — Psych (Signed)
 Virtual Visit via Video Note  I connected with Pamela Calhoun on 04/13/24 at  9:00 AM EDT by a video enabled telemedicine application and verified that I am speaking with the correct person using two identifiers.  Location: Patient: patient home Provider: clinical home office   I discussed the limitations of evaluation and management by telemedicine and the availability of in person appointments. The patient expressed understanding and agreed to proceed.  I discussed the assessment and treatment plan with the patient. The patient was provided an opportunity to ask questions and all were answered. The patient agreed with the plan and demonstrated an understanding of the instructions.   The patient was advised to call back or seek an in-person evaluation if the symptoms worsen or if the condition fails to improve as anticipated.  Pt was provided 240 minutes of non-face-to-face time during this encounter.   Ethelle Herb, LCSW   [facility] BH PHP THERAPIST PROGRESS NOTE  Pamela Calhoun [id number]  Session Time: 9:00 - 10:00  Participation Level: Active  Behavioral Response: CasualAlertDepressed  Type of Therapy: Group Therapy  Treatment Goals addressed: Coping  Progress Towards Goals: Progressing  Interventions: CBT, DBT, Supportive, and Reframing  Summary: Pamela Calhoun is a 38 y.o. female who presents with depression and grief symptoms.  Clinician led check-in regarding current stressors and situation, and review of patient completed daily inventory. Clinician utilized active listening and empathetic response and validated patient emotions. Clinician facilitated processing group on pertinent issues.   Therapist Response: Patient arrived within time allowed. Patient rates her mood at a 4 on a scale of 1-10 with 10 being best. Pt states she feels "emotionally drained." Pt states she slept 7 hours and ate 3x. Pt reports she cried most of the day yesterday. Pt shares her best  friend came over as planned and treated pt to an at-home mani/pedi which pt appreciated and felt "touched." Pt reports feeling depleted today but moving to a better place.  Pt able to process. Pt engaged in discussion.          Session Time: 10:00 am - 11:00 am   Participation Level: Active   Behavioral Response: CasualAlertDepressed   Type of Therapy: Group Therapy   Treatment Goals addressed: Coping   Progress Towards Goals: Progressing   Interventions: CBT, DBT, Solution Focused, Strength-based, Supportive, and Reframing   Summary: Cln led discussion on emotional reasoning and provided context in terms of CBT unhealthy thought patterns. Cln encouraged pt's to be more specific in their speech and thought dialogues to highlight the presence of a feeling, a mutable and time limited experience. Group members were encouraged to use reminder: "feelings do not equal fact."   Therapist Response:  Pt engaged in discussion and is able to identify patterns in which they struggle to differentiate feelings and fact.            Session Time: 11:00 -12:00   Participation Level: Active   Behavioral Response: CasualAlertDepressed   Type of Therapy: Group Therapy   Treatment Goals addressed: Coping   Progress Towards Goals: Progressing   Interventions: CBT, DBT, Solution Focused, Strength-based, Supportive, and Reframing   Summary: Cln led discussion on deep breathing and its therapeutic benefits, using DBT TIPP skills to inform discussion. Group practiced how to breathe from their diaphragms to ensure therapeutic quality and different ways to keep track of regulating breaths.    Therapist Response: Pt engaged in discussion and practice.  Session Time: 12:00 -1:00   Participation Level: Active   Behavioral Response: CasualAlertDepressed   Type of Therapy: Group therapy   Treatment Goals addressed: Coping   Progress Towards Goals: Progressing    Interventions: OT group   Summary: 12:00 - 12:50: Occupational Therapy group with cln [name].  12:50 - 1:00 Clinician assessed for immediate needs, medication compliance and efficacy, and safety concerns.   Therapist Response: 12:00 - 12:50: Pt participated 12:50 - 1:00 pm: At check-out, patient reports no immediate concerns. Patient demonstrates progress as evidenced by continued engagement and responsiveness to treatment. Patient denies SI/HI/self-harm thoughts at the end of group.    Suicidal/Homicidal: Nowithout intent/plan  Plan: Pt will continue in PHP while working to decrease depression and grief symptoms, increase in daily functioning, and increase in ability to manage symptoms in a healthy manner.   Collaboration of Care: Medication Management AEB Laymon Priest, MD  Patient/Guardian was advised Release of Information must be obtained prior to any record release in order to collaborate their care with an outside provider. Patient/Guardian was advised if they have not already done so to contact the registration department to sign all necessary forms in order for us  to release information regarding their care.   Consent: Patient/Guardian gives verbal consent for treatment and assignment of benefits for services provided during this visit. Patient/Guardian expressed understanding and agreed to proceed.   Diagnosis: Severe episode of recurrent major depressive disorder, without psychotic features (HCC) [F33.2]    1. Severe episode of recurrent major depressive disorder, without psychotic features (HCC)   2. GAD (generalized anxiety disorder)       Ethelle Herb, LCSW

## 2024-04-22 NOTE — Psych (Signed)
 Virtual Visit via Video Note  I connected with Pamela Calhoun on 04/10/24 at  9:00 AM EDT by a video enabled telemedicine application and verified that I am speaking with the correct person using two identifiers.  Location: Patient: patient home Provider: clinical home office   I discussed the limitations of evaluation and management by telemedicine and the availability of in person appointments. The patient expressed understanding and agreed to proceed.  I discussed the assessment and treatment plan with the patient. The patient was provided an opportunity to ask questions and all were answered. The patient agreed with the plan and demonstrated an understanding of the instructions.   The patient was advised to call back or seek an in-person evaluation if the symptoms worsen or if the condition fails to improve as anticipated.  Pt was provided 240 minutes of non-face-to-face time during this encounter.   Ethelle Herb, LCSW   [facility] PHP THERAPIST PROGRESS NOTE  Pamela Calhoun [id number]  Session Time: 9:00 - 10:00  Participation Level: Active  Behavioral Response: CasualAlertDepressed  Type of Therapy: Group Therapy  Treatment Goals addressed: Coping  Progress Towards Goals: Initial  Interventions: CBT, DBT, Supportive, and Reframing  Summary: Pamela Calhoun is a 38 y.o. female who presents with depression and grief symptoms.  Clinician led check-in regarding current stressors and situation, and review of patient completed daily inventory. Clinician utilized active listening and empathetic response and validated patient emotions. Clinician facilitated processing group on pertinent issues.   Therapist Response: Patient arrived within time allowed. Patient rates her mood at a 3 on a scale of 1-10 with 10 being best. Pt states she feels "not great." Pt states she slept 4 hours and ate 0x. Pt reports struggle with getting out of the house and doing basic tasks. Pt shares  she is actively grieving her mom and cries every day and feels alone. Pt able to process. Pt engaged in discussion.          Session Time: 10:00 am - 11:00 am   Participation Level: Active   Behavioral Response: CasualAlertDepressed   Type of Therapy: Group Therapy   Treatment Goals addressed: Coping   Progress Towards Goals: Progressing   Interventions: CBT, DBT, Solution Focused, Strength-based, Supportive, and Reframing   Summary: Cln led processing group for pt's current struggles. Group members shared stressors and provided support and feedback. Cln brought in topics of boundaries, healthy relationships, and unhealthy thought processes to inform discussion.    Therapist Response:  Pt able to process and provide support to group.          Session Time: 11:00 -12:00   Participation Level: Active   Behavioral Response: CasualAlertDepressed   Type of Therapy: Group Therapy   Treatment Goals addressed: Coping   Progress Towards Goals: Progressing   Interventions: CBT, DBT, Solution Focused, Strength-based, Supportive, and Reframing   Summary: Cln led discussion on CBT reframing and how to look for alternative possibilities to decrease distress from negative thinking. Cln utilized the CBT triangle to aid discussion. Group members shared difficult situations and worked with group to reframe the negative thinking present.    Therapist Response:  Pt engaged in Engineer, manufacturing.                Session Time: 12:00 -1:00   Participation Level: Active   Behavioral Response: CasualAlertDepressed   Type of Therapy: Group therapy   Treatment Goals addressed: Coping   Progress Towards Goals: Progressing   Interventions: OT  group   Summary: 12:00 - 12:50: Occupational Therapy group with cln [name].  12:50 - 1:00 Clinician assessed for immediate needs, medication compliance and efficacy, and safety concerns.   Therapist Response: 12:00 - 12:50: Pt  participated 12:50 - 1:00 pm: At check-out, patient reports no immediate concerns. Patient demonstrates progress as evidenced by participating in first group session. Patient denies SI/HI/self-harm thoughts at the end of group.    Suicidal/Homicidal: Nowithout intent/plan  Plan: Pt will continue in PHP while working to decrease depression and grief symptoms, increase in daily functioning, and increase in ability to manage symptoms in a healthy manner.   Collaboration of Care: Medication Management AEB Laymon Priest, MD  Patient/Guardian was advised Release of Information must be obtained prior to any record release in order to collaborate their care with an outside provider. Patient/Guardian was advised if they have not already done so to contact the registration department to sign all necessary forms in order for us  to release information regarding their care.   Consent: Patient/Guardian gives verbal consent for treatment and assignment of benefits for services provided during this visit. Patient/Guardian expressed understanding and agreed to proceed.   Diagnosis: Severe episode of recurrent major depressive disorder, without psychotic features (HCC) [F33.2]    1. Severe episode of recurrent major depressive disorder, without psychotic features (HCC)       Ethelle Herb, LCSW
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
## 2024-04-22 NOTE — Psych (Signed)
 Virtual Visit via Video Note  I connected with Pamela Calhoun on 04/11/24 at  9:00 AM EDT by a video enabled telemedicine application and verified that I am speaking with the correct person using two identifiers.  Location: Patient: patient home Provider: clinical home office   I discussed the limitations of evaluation and management by telemedicine and the availability of in person appointments. The patient expressed understanding and agreed to proceed.  I discussed the assessment and treatment plan with the patient. The patient was provided an opportunity to ask questions and all were answered. The patient agreed with the plan and demonstrated an understanding of the instructions.   The patient was advised to call back or seek an in-person evaluation if the symptoms worsen or if the condition fails to improve as anticipated.  Pt was provided 240 minutes of non-face-to-face time during this encounter.   Ethelle Herb, LCSW   [facility] PHP THERAPIST PROGRESS NOTE  Pamela Calhoun [id number]  Session Time: 9:00 - 10:00  Participation Level: Active  Behavioral Response: CasualAlertDepressed  Type of Therapy: Group Therapy  Treatment Goals addressed: Coping  Progress Towards Goals: Initial  Interventions: CBT, DBT, Supportive, and Reframing  Summary: Pamela Calhoun is a 38 y.o. female who presents with depression and grief symptoms.  Clinician led check-in regarding current stressors and situation, and review of patient completed daily inventory. Clinician utilized active listening and empathetic response and validated patient emotions. Clinician facilitated processing group on pertinent issues.   Therapist Response: Patient arrived within time allowed. Patient rates her mood at a 5 on a scale of 1-10 with 10 being best. Pt states she feels "okay." Pt states she slept 4 hours and ate 2x. Pt reports she did not leave the house yesterday and was in bed most of the day. 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 Pt reports struggling with moving through her current grief.  Pt able to process. Pt engaged in discussion.          Session Time: 10:00 am - 11:00 am   Participation Level: Active   Behavioral Response: CasualAlertDepressed   Type of Therapy: Group Therapy   Treatment Goals addressed: Coping   Progress Towards Goals: Progressing   Interventions: CBT, DBT, Solution Focused, Strength-based, Supportive, and Reframing   Summary: Cln led discussion on control and the way it impacts our lives. Group members shared struggles and worked to identify the way in which control is contributing to the struggle. Cln utilized CBT thought challenging and the Catch-Challenge-Change model to address their control issues.    Therapist Response:  Pt engaged in discussion and is able to determine ways in which control is an issue for them and brainstormed how to address it in a healthy manner.            Session Time: 11:00 -12:00   Participation Level: Active   Behavioral Response: CasualAlertDepressed   Type of Therapy: Group Therapy   Treatment Goals addressed: Coping   Progress Towards Goals: Progressing   Interventions: CBT, DBT, Solution Focused, Strength-based, Supportive, and Reframing   Summary: Cln introduced the "Sensations" distraction skills from DBT distress tolerance skill ACCEPTS. Cln discussed how this set of distraction skills can be helpful in situations where you feel overwhelmed and need to stabilize quickly.    Therapist Response: Pt engaged in discussion and identifies how they can practice this skill.  Session Time: 12:00 -1:00   Participation Level: Active   Behavioral Response: CasualAlertDepressed   Type of Therapy: Group therapy   Treatment Goals addressed: Coping   Progress Towards Goals: Progressing   Interventions: OT  group   Summary: 12:00 - 12:50: Occupational Therapy group with cln [name].  12:50 - 1:00 Clinician assessed for immediate needs, medication compliance and efficacy, and safety concerns.   Therapist Response: 12:00 - 12:50: Pt participated 12:50 - 1:00 pm: At check-out, patient reports no immediate concerns. Patient demonstrates progress as evidenced by continued engagement and responsiveness to treatment. Patient denies SI/HI/self-harm thoughts at the end of group.    Suicidal/Homicidal: Nowithout intent/plan  Plan: Pt will continue in PHP while working to decrease depression and grief symptoms, increase in daily functioning, and increase in ability to manage symptoms in a healthy manner.   Collaboration of Care: Medication Management AEB Laymon Priest, MD  Patient/Guardian was advised Release of Information must be obtained prior to any record release in order to collaborate their care with an outside provider. Patient/Guardian was advised if they have not already done so to contact the registration department to sign all necessary forms in order for us  to release information regarding their care.   Consent: Patient/Guardian gives verbal consent for treatment and assignment of benefits for services provided during this visit. Patient/Guardian expressed understanding and agreed to proceed.   Diagnosis: Severe episode of recurrent major depressive disorder, without psychotic features (HCC) [F33.2]    1. Severe episode of recurrent major depressive disorder, without psychotic features (HCC)   2. GAD (generalized anxiety disorder)       Ethelle Herb, LCSW

## 2024-04-22 NOTE — Psych (Signed)
 Virtual Visit via Video Note  I connected with Pamela Calhoun on 04/12/24 at  9:00 AM EDT by a video enabled telemedicine application and verified that I am speaking with the correct person using two identifiers.  Location: Patient: patient home Provider: clinical home office   I discussed the limitations of evaluation and management by telemedicine and the availability of in person appointments. The patient expressed understanding and agreed to proceed.  I discussed the assessment and treatment plan with the patient. The patient was provided an opportunity to ask questions and all were answered. The patient agreed with the plan and demonstrated an understanding of the instructions.   The patient was advised to call back or seek an in-person evaluation if the symptoms worsen or if the condition fails to improve as anticipated.  Pt was provided 240 minutes of non-face-to-face time during this encounter.   Ethelle Herb, LCSW   [facility] BH PHP THERAPIST PROGRESS NOTE  [name] [id number]  Session Time: 9:00 - 10:00  Participation Level: Active  Behavioral Response: CasualAlertDepressed  Type of Therapy: Group Therapy  Treatment Goals addressed: Coping  Progress Towards Goals: Initial  Interventions: CBT, DBT, Supportive, and Reframing  Summary: Pamela Calhoun is a 38 y.o. female who presents with depression and grief symptoms.  Clinician led check-in regarding current stressors and situation, and review of patient completed daily inventory. Clinician utilized active listening and empathetic response and validated patient emotions. Clinician facilitated processing group on pertinent issues.   Therapist Response: Patient arrived within time allowed. Patient rates her mood at a 2 on a scale of 1-10 with 10 being best. Pt states she feels "sad." Pt states she slept 6 hours and ate 2x. Pt reports today is the anniversary of a boyfriend dying and it is a difficult day for pt.  Pt reports today is even more difficult because her mom is typically her main support and this is the first anniversary she is missing. Pt is crying throughout session. Pt shares her best friend is coming over after group and they will spend the day together for support. Pt able to process. Pt engaged in discussion.          Session Time: 10:00 am - 11:00 am   Participation Level: Active   Behavioral Response: CasualAlertDepressed   Type of Therapy: Group Therapy   Treatment Goals addressed: Coping   Progress Towards Goals: Progressing   Interventions: CBT, DBT, Solution Focused, Strength-based, Supportive, and Reframing   Summary: Cln led processing group for pt's current struggles. Group members shared stressors and provided support and feedback. Cln brought in topics of boundaries, healthy relationships, and unhealthy thought processes to inform discussion.     Therapist Response: Pt able to process and provide support to group.              Session Time: 11:00 -12:00   Participation Level: Active   Behavioral Response: CasualAlertDepressed   Type of Therapy: Group Therapy   Treatment Goals addressed: Coping   Progress Towards Goals: Progressing   Interventions: Strength-based, Supportive, and Reframing   Summary: Chaplaincy group with [name]   Therapist Response: Pt participated and engaged in discussion.                Session Time: 12:00 -1:00   Participation Level: Active   Behavioral Response: CasualAlertDepressed   Type of Therapy: Group therapy   Treatment Goals addressed: Coping   Progress Towards Goals: Progressing   Interventions: OT group  Summary: 12:00 - 12:50: Occupational Therapy group with cln [name].  12:50 - 1:00 Clinician assessed for immediate needs, medication compliance and efficacy, and safety concerns.   Therapist Response: 12:00 - 12:50: Pt participated 12:50 - 1:00 pm: At check-out, patient reports no  immediate concerns. Patient demonstrates progress as evidenced by continued engagement and responsiveness to treatment. Patient denies SI/HI/self-harm thoughts at the end of group.    Suicidal/Homicidal: Nowithout intent/plan  Plan: Pt will continue in PHP while working to decrease depression and grief symptoms, increase in daily functioning, and increase in ability to manage symptoms in a healthy manner.   Collaboration of Care: Medication Management AEB Laymon Priest, MD  Patient/Guardian was advised Release of Information must be obtained prior to any record release in order to collaborate their care with an outside provider. Patient/Guardian was advised if they have not already done so to contact the registration department to sign all necessary forms in order for us  to release information regarding their care.   Consent: Patient/Guardian gives verbal consent for treatment and assignment of benefits for services provided during this visit. Patient/Guardian expressed understanding and agreed to proceed.   Diagnosis: Severe episode of recurrent major depressive disorder, without psychotic features (HCC) [F33.2]    1. Severe episode of recurrent major depressive disorder, without psychotic features (HCC)   2. GAD (generalized anxiety disorder)       Ethelle Herb, LCSW

## 2024-04-24 ENCOUNTER — Ambulatory Visit (INDEPENDENT_AMBULATORY_CARE_PROVIDER_SITE_OTHER): Admitting: Professional

## 2024-04-24 DIAGNOSIS — F411 Generalized anxiety disorder: Secondary | ICD-10-CM

## 2024-04-24 DIAGNOSIS — F332 Major depressive disorder, recurrent severe without psychotic features: Secondary | ICD-10-CM
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
## 2024-04-25 ENCOUNTER — Telehealth (HOSPITAL_COMMUNITY): Payer: Self-pay | Admitting: Licensed Clinical Social Worker

## 2024-04-25 ENCOUNTER — Ambulatory Visit (HOSPITAL_COMMUNITY)

## 2024-04-25 DIAGNOSIS — F332 Major depressive disorder, recurrent severe without psychotic features: Secondary | ICD-10-CM

## 2024-04-25 DIAGNOSIS — F411 Generalized anxiety disorder: Secondary | ICD-10-CM

## 2024-04-26 ENCOUNTER — Ambulatory Visit (HOSPITAL_COMMUNITY)

## 2024-04-27 ENCOUNTER — Encounter (HOSPITAL_COMMUNITY): Payer: Self-pay

## 2024-04-27 ENCOUNTER — Ambulatory Visit (HOSPITAL_COMMUNITY)

## 2024-04-27 NOTE — Psych (Signed)
 Virtual Visit via Video Note  I connected with Pamela Calhoun on 04/25/24 at  9:00 AM EDT by a video enabled telemedicine application and verified that I am speaking with the correct person using two identifiers.  Location: Patient: patient home Provider: clinical home office   I discussed the limitations of evaluation and management by telemedicine and the availability of in person appointments. The patient expressed understanding and agreed to proceed.  I discussed the assessment and treatment plan with the patient. The patient was provided an opportunity to ask questions and all were answered. The patient agreed with the plan and demonstrated an understanding of the instructions.   The patient was advised to call back or seek an in-person evaluation if the symptoms worsen or if the condition fails to improve as anticipated.  Pt was provided 60 minutes of non-face-to-face time during this encounter.   Buford Carnes, [facility]   [facility] BH PHP THERAPIST PROGRESS NOTE  Pamela Calhoun [id number]  Session Time: 9:00 - 10:00  Participation Level: Minimal  Behavioral Response: CasualAlertDepressed  Type of Therapy: Group Therapy  Treatment Goals addressed: Coping  Progress Towards Goals: Progressing  Interventions: CBT, DBT, Supportive, and Reframing  Summary: Pamela Calhoun is a 38 y.o. female who presents with depression and grief symptoms. Clinician led check-in regarding current stressors and situation, and review of patient completed daily inventory. Clinician utilized active listening and empathetic response and validated patient emotions. Clinician facilitated processing group on pertinent issues.   Therapist Response: Patient arrived within time allowed. Patient rates her mood at a "4" on a scale of 1-10 with 10 being best. Pt states she feels "sick." Pt states she slept 9 hours and ate 2x. Pt reports she was triggered by video OT Pamela Calhoun showed yesterday because the  presenter shared they had lost a child. Pt able to process. Pt engaged in discussion.  Patient ended up leaving group early due to illness.   Suicidal/Homicidal: Nowithout intent/plan  Plan: Pt will continue in PHP while working to decrease depression and grief symptoms, increase in daily functioning, and increase in ability to manage symptoms in a healthy manner.   Collaboration of Care: Medication Management AEB Laymon Priest, MD and/or Dan Dun, NP  Patient/Guardian was advised Release of Information must be obtained prior to any record release in order to collaborate their care with an outside provider. Patient/Guardian was advised if they have not already done so to contact the registration department to sign all necessary forms in order for us  to release information regarding their care.   Consent: Patient/Guardian gives verbal consent for treatment and assignment of benefits for services provided during this visit. Patient/Guardian expressed understanding and agreed to proceed.   Diagnosis: Severe episode of recurrent major depressive disorder, without psychotic features (HCC) [F33.2]    1. Severe episode of recurrent major depressive disorder, without psychotic features (HCC)   2. GAD (generalized anxiety disorder)       Buford Carnes, [facility]

## 2024-04-27 NOTE — Psych (Signed)
 Virtual Visit via Video Note  I connected with Pamela Calhoun on 04/24/24 at  9:00 AM EDT by a video enabled telemedicine application and verified that I am speaking with the correct person using two identifiers.  Location: Patient: patient home Provider: clinical home office   I discussed the limitations of evaluation and management by telemedicine and the availability of in person appointments. The patient expressed understanding and agreed to proceed.  I discussed the assessment and treatment plan with the patient. The patient was provided an opportunity to ask questions and all were answered. The patient agreed with the plan and demonstrated an understanding of the instructions.   The patient was advised to call back or seek an in-person evaluation if the symptoms worsen or if the condition fails to improve as anticipated.  Pt was provided 240 minutes of non-face-to-face time during this encounter.   Buford Carnes, [facility]   [facility] BH PHP THERAPIST PROGRESS NOTE  Pamela Calhoun [id number]  Session Time: 9:00 - 10:00  Participation Level: Active  Behavioral Response: CasualAlertDepressed  Type of Therapy: Group Therapy  Treatment Goals addressed: Coping  Progress Towards Goals: Progressing  Interventions: CBT, DBT, Supportive, and Reframing  Summary: Pamela Calhoun is a 38 y.o. female who presents with depression and grief symptoms.  Clinician led check-in regarding current stressors and situation, and review of patient completed daily inventory. Clinician utilized active listening and empathetic response and validated patient emotions. Clinician facilitated processing group on pertinent issues.   Therapist Response: Patient arrived within time allowed. Patient rates her mood at a "4" on a scale of 1-10 with 10 being best. Pt states she feels "OK." Pt states she slept 9 hours and ate 1x. Pt reports she had some hopeless feelings over the weekend. She reports went to  stay with her God Mother and has appreciated her support. She reports it was helpful to out of the space where everything reminds her of her mother. She reports she continues to not feel well. Pt able to process. Pt engaged in discussion.    Session Time: 10:00 am - 11:00 am   Participation Level: Active   Behavioral Response: CasualAlertDepressed   Type of Therapy: Group Therapy   Treatment Goals addressed: Coping   Progress Towards Goals: Progressing   Interventions: CBT, DBT, Solution Focused, Strength-based, Supportive, and Reframing   Summary: Group spent time discussing opposite action, cognitive distortions such as black/white thinking and "should" statements. Group spent time discussing how to reframe these thoughts and practiced together. Group spent time discussing celebrating the "little wins" to continue to encourage motivation.   Therapist Response:  Pt engaged in discussion and identifies ways opposite action, reframing, and celebrating "little wins" can be helpful.      Session Time: 11:00 -12:00   Participation Level: Active   Behavioral Response: CasualAlertDepressed   Type of Therapy: Group Therapy   Treatment Goals addressed: Coping   Progress Towards Goals: Progressing   Interventions: CBT, DBT, Solution Focused, Strength-based, Supportive, and Reframing   Summary: Cln led discussion on Positive Psychology. Group watched TedTalk: "The Happiness Advantage" and discussed how positive psychology can be helpful to rewire brains to view the world and situations in a more positive manner.    Therapist Response: Pt engaged in discussion. She reports she connects with the idea of "moving the goal post" which moves happiness over the cognitive horizon.       Session Time: 12:00 -1:00   Participation Level: Active  Behavioral Response: CasualAlertDepressed   Type of Therapy: Group therapy   Treatment Goals addressed: Coping   Progress Towards Goals:  Progressing   Interventions: OT group   Summary: 12:00 - 12:50: Occupational Therapy group with cln [name].  12:50 - 1:00 Clinician assessed for immediate needs, medication compliance and efficacy, and safety concerns.   Therapist Response: 12:00 - 12:50: Pt participated 12:50 - 1:00 pm: At check-out, patient reports no immediate concerns. Patient demonstrates progress as evidenced by continued engagement and responsiveness to treatment. Patient denies SI/HI/self-harm thoughts at the end of group.    Suicidal/Homicidal: Nowithout intent/plan  Plan: Pt will continue in PHP while working to decrease depression and grief symptoms, increase in daily functioning, and increase in ability to manage symptoms in a healthy manner.   Collaboration of Care: Medication Management AEB Laymon Priest, MD  Patient/Guardian was advised Release of Information must be obtained prior to any record release in order to collaborate their care with an outside provider. Patient/Guardian was advised if they have not already done so to contact the registration department to sign all necessary forms in order for us  to release information regarding their care.   Consent: Patient/Guardian gives verbal consent for treatment and assignment of benefits for services provided during this visit. Patient/Guardian expressed understanding and agreed to proceed.   Diagnosis: Severe episode of recurrent major depressive disorder, without psychotic features (HCC) [F33.2]    1. Severe episode of recurrent major depressive disorder, without psychotic features (HCC)   2. GAD (generalized anxiety disorder)       Buford Carnes, [facility]

## 2024-04-28 ENCOUNTER — Ambulatory Visit (HOSPITAL_COMMUNITY)

## 2024-04-28 ENCOUNTER — Encounter (HOSPITAL_COMMUNITY): Payer: Self-pay

## 2024-05-01 ENCOUNTER — Ambulatory Visit (HOSPITAL_COMMUNITY): Admitting: Licensed Clinical Social Worker

## 2024-05-01 DIAGNOSIS — F411 Generalized anxiety disorder: Secondary | ICD-10-CM | POA: Diagnosis not present

## 2024-05-01 DIAGNOSIS — F339 Major depressive disorder, recurrent, unspecified: Secondary | ICD-10-CM

## 2024-05-01 DIAGNOSIS — F332 Major depressive disorder, recurrent severe without psychotic features: Secondary | ICD-10-CM
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
## 2024-05-02 ENCOUNTER — Ambulatory Visit (INDEPENDENT_AMBULATORY_CARE_PROVIDER_SITE_OTHER)
Admission: RE | Admit: 2024-05-02 | Discharge: 2024-05-02 | Disposition: A | Discharge status: Home / Self Care | Source: Ambulatory Visit | Attending: Family Medicine | Admitting: Family Medicine

## 2024-05-02 ENCOUNTER — Encounter: Payer: Self-pay | Admitting: Family Medicine

## 2024-05-02 ENCOUNTER — Ambulatory Visit (INDEPENDENT_AMBULATORY_CARE_PROVIDER_SITE_OTHER): Admitting: Licensed Clinical Social Worker

## 2024-05-02 ENCOUNTER — Ambulatory Visit (INDEPENDENT_AMBULATORY_CARE_PROVIDER_SITE_OTHER): Admitting: Family Medicine

## 2024-05-02 ENCOUNTER — Ambulatory Visit: Payer: Self-pay | Admitting: Family Medicine

## 2024-05-02 ENCOUNTER — Encounter (HOSPITAL_COMMUNITY): Payer: Self-pay

## 2024-05-02 VITALS — BP 106/64 | HR 80 | Temp 97.7°F | Ht 70.0 in | Wt 163.2 lb

## 2024-05-02 DIAGNOSIS — M545 Low back pain, unspecified: Secondary | ICD-10-CM | POA: Insufficient documentation

## 2024-05-02 DIAGNOSIS — M7918 Myalgia, other site: Secondary | ICD-10-CM | POA: Insufficient documentation

## 2024-05-02 DIAGNOSIS — M546 Pain in thoracic spine: Secondary | ICD-10-CM | POA: Diagnosis not present

## 2024-05-02 DIAGNOSIS — F411 Generalized anxiety disorder: Secondary | ICD-10-CM

## 2024-05-02 DIAGNOSIS — R5383 Other fatigue: Secondary | ICD-10-CM | POA: Insufficient documentation

## 2024-05-02 DIAGNOSIS — F332 Major depressive disorder, recurrent severe without psychotic features: Secondary | ICD-10-CM

## 2024-05-02 DIAGNOSIS — N3010 Interstitial cystitis (chronic) without hematuria: Secondary | ICD-10-CM | POA: Diagnosis not present

## 2024-05-02 DIAGNOSIS — R5382 Chronic fatigue, unspecified: Secondary | ICD-10-CM

## 2024-05-02 DIAGNOSIS — M2550 Pain in unspecified joint: Secondary | ICD-10-CM

## 2024-05-02 DIAGNOSIS — G8929 Other chronic pain: Secondary | ICD-10-CM | POA: Diagnosis not present

## 2024-05-02 DIAGNOSIS — R768 Other specified abnormal immunological findings in serum: Secondary | ICD-10-CM

## 2024-05-02 DIAGNOSIS — M79606 Pain in leg, unspecified: Secondary | ICD-10-CM | POA: Diagnosis not present

## 2024-05-02 NOTE — Assessment & Plan Note (Signed)
 Skin /muscle/joint  Also myalgia  Also IC and chronic back pain   Suspect myofascial pain disorder   Is under care but may benefit later from snri meds if not tried in past   Labs pending for this and fatigue
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
## 2024-05-02 NOTE — Assessment & Plan Note (Signed)
 Continues urology care Baclofen prn  Valium suppositories (?) [place]miron

## 2024-05-02 NOTE — Assessment & Plan Note (Signed)
 Multiple No swelling or rash or fever   RF and ANA, ESR and crp added to labs

## 2024-05-02 NOTE — Assessment & Plan Note (Signed)
 Continues mental health care - IOP
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
## 2024-05-02 NOTE — Assessment & Plan Note (Signed)
 Acute on chronic  Worse on left Sees Dr Saturnino Curia for chiropractor - needs referral for further care Feel she would also benefit from PT   Xray today notes normal alignment/ normal lordosis and preserved disk spaces but multi level osteophytosis , no fractures   Will place referral  If needed can separately refer for PT  Encouraged stretching and heat and ice  Has baclofen for her IC also

## 2024-05-02 NOTE — Patient Instructions (Addendum)
 We will reach out with xray results and make plan   Use heat intermittently with ice to see what works   Let's get you back for some labs (for fatigue and pain)   There are some anti depressants that may help pain along with mood  Cymbalta, effexor and pristiq are some brands  They are called SNRIs   Keep working on mental health

## 2024-05-02 NOTE — Assessment & Plan Note (Signed)
 Mainly left but does cross over  Acute on chronic Positional  Xray noted multi level osteophytes/ some baseline kyphosis but no other acute changes   Pt desires referral to chiropractor Dr Saturnino Curia Planning PT with him as well

## 2024-05-03 ENCOUNTER — Ambulatory Visit (HOSPITAL_COMMUNITY): Admitting: Licensed Clinical Social Worker

## 2024-05-03 DIAGNOSIS — F411 Generalized anxiety disorder: Secondary | ICD-10-CM | POA: Diagnosis not present

## 2024-05-03 DIAGNOSIS — F332 Major depressive disorder, recurrent severe without psychotic features: Secondary | ICD-10-CM | POA: Diagnosis not present

## 2024-05-04 ENCOUNTER — Other Ambulatory Visit

## 2024-05-04 ENCOUNTER — Ambulatory Visit (HOSPITAL_COMMUNITY): Admitting: Licensed Clinical Social Worker

## 2024-05-04 DIAGNOSIS — F332 Major depressive disorder, recurrent severe without psychotic features: Secondary | ICD-10-CM

## 2024-05-04 DIAGNOSIS — F411 Generalized anxiety disorder: Secondary | ICD-10-CM

## 2024-05-05 ENCOUNTER — Other Ambulatory Visit (INDEPENDENT_AMBULATORY_CARE_PROVIDER_SITE_OTHER)

## 2024-05-05 ENCOUNTER — Ambulatory Visit (HOSPITAL_COMMUNITY): Admitting: Licensed Clinical Social Worker

## 2024-05-05 DIAGNOSIS — M2550 Pain in unspecified joint: Secondary | ICD-10-CM

## 2024-05-05 DIAGNOSIS — R5382 Chronic fatigue, unspecified: Secondary | ICD-10-CM

## 2024-05-05 DIAGNOSIS — F411 Generalized anxiety disorder: Secondary | ICD-10-CM | POA: Diagnosis not present

## 2024-05-05 DIAGNOSIS — F332 Major depressive disorder, recurrent severe without psychotic features: Secondary | ICD-10-CM

## 2024-05-05 DIAGNOSIS — M7918 Myalgia, other site: Secondary | ICD-10-CM

## 2024-05-05 NOTE — Addendum Note (Signed)
 Addended by: Gerry Krone on: 05/05/2024 04:02 PM   Modules accepted: Orders

## 2024-05-07 LAB — CBC WITH DIFFERENTIAL/PLATELET
Absolute Lymphocytes: 3721 {cells}/uL (ref 850–3900)
Absolute Monocytes: 647 {cells}/uL (ref 200–950)
Basophils Absolute: 42 {cells}/uL (ref 0–200)
Basophils Relative: 0.5 %
Eosinophils Absolute: 949 {cells}/uL — ABNORMAL HIGH (ref 15–500)
Eosinophils Relative: 11.3 %
HCT: 38.8 % (ref 35.0–45.0)
Hemoglobin: 12.6 g/dL (ref 11.7–15.5)
MCH: 30.4 pg (ref 27.0–33.0)
MCHC: 32.5 g/dL (ref 32.0–36.0)
MCV: 93.5 fL (ref 80.0–100.0)
MPV: 10.2 fL (ref 7.5–12.5)
Monocytes Relative: 7.7 %
Neutro Abs: 3041 {cells}/uL (ref 1500–7800)
Neutrophils Relative %: 36.2 %
Platelets: 270 10*3/uL (ref 140–400)
RBC: 4.15 10*6/uL (ref 3.80–5.10)
RDW: 12 % (ref 11.0–15.0)
Total Lymphocyte: 44.3 %
WBC: 8.4 10*3/uL (ref 3.8–10.8)

## 2024-05-07 LAB — ANTI-NUCLEAR AB-TITER (ANA TITER): ANA Titer 1: 1:40 {titer} — ABNORMAL HIGH

## 2024-05-07 LAB — COMPREHENSIVE METABOLIC PANEL WITH GFR
AG Ratio: 1.5 (calc) (ref 1.0–2.5)
ALT: 12 U/L (ref 6–29)
AST: 13 U/L (ref 10–30)
Albumin: 4 g/dL (ref 3.6–5.1)
Alkaline phosphatase (APISO): 66 U/L (ref 31–125)
BUN: 9 mg/dL (ref 7–25)
CO2: 29 mmol/L (ref 20–32)
Calcium: 9.3 mg/dL (ref 8.6–10.2)
Chloride: 106 mmol/L (ref 98–110)
Creat: 0.89 mg/dL (ref 0.50–0.97)
Globulin: 2.7 g/dL (ref 1.9–3.7)
Glucose, Bld: 93 mg/dL (ref 65–99)
Potassium: 5 mmol/L (ref 3.5–5.3)
Sodium: 140 mmol/L (ref 135–146)
Total Bilirubin: 0.2 mg/dL (ref 0.2–1.2)
Total Protein: 6.7 g/dL (ref 6.1–8.1)
eGFR: 85 mL/min/{1.73_m2} (ref >=60)

## 2024-05-07 LAB — IRON: Iron: 40 ug/dL (ref 40–190)

## 2024-05-07 LAB — VITAMIN B12: Vitamin B-12: 438 pg/mL (ref 200–1100)

## 2024-05-07 LAB — T4, FREE: Free T4: 0.9 ng/dL (ref 0.8–1.8)

## 2024-05-07 LAB — VITAMIN D 25 HYDROXY (VIT D DEFICIENCY, FRACTURES): Vit D, 25-Hydroxy: 34 ng/mL (ref 30–100)

## 2024-05-07 LAB — ANA: Anti Nuclear Antibody (ANA): POSITIVE — AB

## 2024-05-07 LAB — SEDIMENTATION RATE: Sed Rate: 9 mm/h (ref 0–20)

## 2024-05-07 LAB — C-REACTIVE PROTEIN: CRP: 17.5 mg/L — ABNORMAL HIGH (ref <8.0)

## 2024-05-07 LAB — RHEUMATOID FACTOR: Rheumatoid fact SerPl-aCnc: <10 [IU]/mL (ref <14)

## 2024-05-07 LAB — TSH: TSH: 0.68 m[IU]/L

## 2024-05-08 ENCOUNTER — Telehealth: Payer: Self-pay | Admitting: Family Medicine

## 2024-05-08 ENCOUNTER — Ambulatory Visit (HOSPITAL_COMMUNITY): Admitting: Licensed Clinical Social Worker

## 2024-05-08 DIAGNOSIS — F411 Generalized anxiety disorder: Secondary | ICD-10-CM | POA: Diagnosis not present

## 2024-05-08 DIAGNOSIS — F332 Major depressive disorder, recurrent severe without psychotic features: Secondary | ICD-10-CM

## 2024-05-08 NOTE — Telephone Encounter (Signed)
 Copied from CRM [id number]. Topic: Referral - Status >> May 08, 2024  3:29 PM Pamela Calhoun I wrote: Reason for CRM: Patient was calling about her referral for the chiropractor and wanted to know the update as they have not received it yet.

## 2024-05-09 ENCOUNTER — Ambulatory Visit (HOSPITAL_COMMUNITY): Admitting: Licensed Clinical Social Worker

## 2024-05-09 DIAGNOSIS — F411 Generalized anxiety disorder: Secondary | ICD-10-CM

## 2024-05-09 DIAGNOSIS — F332 Major depressive disorder, recurrent severe without psychotic features: Secondary | ICD-10-CM

## 2024-05-10 ENCOUNTER — Ambulatory Visit (HOSPITAL_COMMUNITY): Admitting: Licensed Clinical Social Worker

## 2024-05-10 ENCOUNTER — Encounter (HOSPITAL_COMMUNITY): Payer: Self-pay

## 2024-05-10 DIAGNOSIS — F411 Generalized anxiety disorder: Secondary | ICD-10-CM

## 2024-05-10 DIAGNOSIS — F332 Major depressive disorder, recurrent severe without psychotic features: Secondary | ICD-10-CM

## 2024-05-10 DIAGNOSIS — O039 Complete or unspecified spontaneous abortion without complication: Secondary | ICD-10-CM | POA: Insufficient documentation

## 2024-05-10 DIAGNOSIS — R768 Other specified abnormal immunological findings in serum: Secondary | ICD-10-CM | POA: Insufficient documentation

## 2024-05-10 NOTE — Telephone Encounter (Signed)
 I put the referral in for rheumatology Please let us  know if you don't hear in 1-2 weeks to set that up
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
## 2024-05-11 ENCOUNTER — Ambulatory Visit (HOSPITAL_COMMUNITY): Admitting: Licensed Clinical Social Worker

## 2024-05-11 DIAGNOSIS — F332 Major depressive disorder, recurrent severe without psychotic features: Secondary | ICD-10-CM

## 2024-05-11 DIAGNOSIS — F411 Generalized anxiety disorder: Secondary | ICD-10-CM

## 2024-05-12 ENCOUNTER — Encounter: Payer: Self-pay | Admitting: *Deleted

## 2024-05-12 ENCOUNTER — Ambulatory Visit (HOSPITAL_COMMUNITY): Admitting: Licensed Clinical Social Worker

## 2024-05-12 DIAGNOSIS — F332 Major depressive disorder, recurrent severe without psychotic features: Secondary | ICD-10-CM

## 2024-05-12 DIAGNOSIS — F411 Generalized anxiety disorder: Secondary | ICD-10-CM

## 2024-05-15 ENCOUNTER — Telehealth: Payer: Self-pay | Admitting: Family Medicine

## 2024-05-15 ENCOUNTER — Ambulatory Visit (HOSPITAL_COMMUNITY)

## 2024-05-15 DIAGNOSIS — F332 Major depressive disorder, recurrent severe without psychotic features: Secondary | ICD-10-CM

## 2024-05-15 DIAGNOSIS — F411 Generalized anxiety disorder: Secondary | ICD-10-CM

## 2024-05-15 NOTE — Telephone Encounter (Signed)
 Copied from CRM [id number]. Topic: General - Other >> May 15, 2024  4:00 PM Donna BRAVO wrote: Reason for CRM: Kim with  [facility]   Cell phone  [phone]  Fax number [phone]  Luke has called several times regarding referrals that are faxed to their office. The referrals are sending [phone] pages  for several different  patients Luke would like this resolved and please give her a call [phone]

## 2024-05-16 ENCOUNTER — Ambulatory Visit (INDEPENDENT_AMBULATORY_CARE_PROVIDER_SITE_OTHER): Admitting: Licensed Clinical Social Worker

## 2024-05-16 DIAGNOSIS — F332 Major depressive disorder, recurrent severe without psychotic features: Secondary | ICD-10-CM | POA: Diagnosis not present

## 2024-05-16 DIAGNOSIS — F411 Generalized anxiety disorder: Secondary | ICD-10-CM

## 2024-05-17 ENCOUNTER — Ambulatory Visit (HOSPITAL_COMMUNITY)

## 2024-05-17 DIAGNOSIS — F332 Major depressive disorder, recurrent severe without psychotic features: Secondary | ICD-10-CM | POA: Diagnosis not present

## 2024-05-17 MED ORDER — TRAZODONE HCL 50 MG PO TABS: 50.0000 mg | ORAL_TABLET | Freq: Every evening | ORAL | 0 refills | Status: DC | PRN

## 2024-05-17 MED ORDER — ESCITALOPRAM OXALATE 20 MG PO TABS: 30.0000 mg | ORAL_TABLET | Freq: Every day | ORAL | Status: DC
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
## 2024-05-18 ENCOUNTER — Ambulatory Visit (INDEPENDENT_AMBULATORY_CARE_PROVIDER_SITE_OTHER): Admitting: Licensed Clinical Social Worker

## 2024-05-18 DIAGNOSIS — F332 Major depressive disorder, recurrent severe without psychotic features: Secondary | ICD-10-CM | POA: Diagnosis not present

## 2024-05-18 DIAGNOSIS — F411 Generalized anxiety disorder: Secondary | ICD-10-CM

## 2024-05-18 NOTE — Progress Notes (Signed)
 Spoke with patient via Teams video call, used 2 identifiers to correctly identify patient. States that today is her last day in group and it has went great. She was out sick for several days but has learned a lot. She is very anxious about leaving group and going to 1:1 therapy. Afraid she will go backwards in her progress. She will return to IOP or PHP if she feels overwhelmed or that therapy isn't helping. On scale 1-10 as 10 being worst she rates depression at 4 and anxiety at 8. Denies SI/HI or AV hallucinations. No side effects from medication.

## 2024-05-22 NOTE — Psych (Signed)
 Virtual Visit via Video Note  I connected with Pamela Calhoun on 05/09/24 at  9:00 AM EDT by a video enabled telemedicine application and verified that I am speaking with the correct person using two identifiers.  Location: Patient: patient home Provider: clinical home office   I discussed the limitations of evaluation and management by telemedicine and the availability of in person appointments. The patient expressed understanding and agreed to proceed.  I discussed the assessment and treatment plan with the patient. The patient was provided an opportunity to ask questions and all were answered. The patient agreed with the plan and demonstrated an understanding of the instructions.   The patient was advised to call back or seek an in-person evaluation if the symptoms worsen or if the condition fails to improve as anticipated.  Pt was provided 240 minutes of non-face-to-face time during this encounter.   Randall Bastos, LCSW   [facility] PHP THERAPIST PROGRESS NOTE  Pamela Calhoun [id number]  Session Time: 9:00 - 10:00  Participation Level: Active  Behavioral Response: CasualAlertDepressed  Type of Therapy: Group Therapy  Treatment Goals addressed: Coping  Progress Towards Goals: Progressing  Interventions: CBT, DBT, Supportive, and Reframing  Summary: Pamela Calhoun is a 38 y.o. female who presents with depression and grief symptoms.  Clinician led check-in regarding current stressors and situation, and review of patient completed daily inventory. Clinician utilized active listening and empathetic response and validated patient emotions. Clinician facilitated processing group on pertinent issues.   Therapist Response: Patient arrived within time allowed. Patient rates her mood at a 2 on a scale of 1-10 with 10 being best. Pt states she feels okay. Pt states she slept 3 hours and ate 2x. Pt reports her test results indicated that she has bladder cancer. Pt states she has  more tests and appointments to determine prognosis and treatment. Pt shares feeling ganged up on by life and like she cannot handle this big life event without her mom. Pt shares she has not told anyone except for group. Pt able to process. Pt engaged in discussion.          Session Time: 10:00 am - 11:00 am   Participation Level: Active   Behavioral Response: CasualAlertDepressed   Type of Therapy: Group Therapy   Treatment Goals addressed: Coping   Progress Towards Goals: Progressing   Interventions: CBT, DBT, Solution Focused, Strength-based, Supportive, and Reframing   Summary: Cln led discussion around support and the vulnerability involved in asking for help. Group members shared the supports they have in their life and identifies where they could grow support. Cln created space for pt's to process struggles and asked challenge based questions to grow insight. Cln brought in topics of vulnerability, community building, and feelings.    Therapist Response: Pt able to process and identify an action step to increase personal support.              Session Time: 11:00 -12:00   Participation Level: Active   Behavioral Response: CasualAlertDepressed   Type of Therapy: Group Therapy   Treatment Goals addressed: Coping   Progress Towards Goals: Progressing   Interventions: CBT, DBT, Solution Focused, Strength-based, Supportive, and Reframing   Summary: Cln led discussion on healthy aggression substitutes. Cln discussed the benefits to discharging energy and adrenaline when feeling revved up in emotion and the importance of balancing that discharge with safety and lack of negative consequences. Group brainstormed different ways to channel aggression in a healthy way and shared  ways that have worked for them in the past.   Therapist Response:  Pt engaged in discussion and identifies 3 options to try.                Session Time: 12:00 -1:00   Participation Level:  Active   Behavioral Response: CasualAlertDepressed   Type of Therapy: Group therapy   Treatment Goals addressed: Coping   Progress Towards Goals: Progressing   Interventions: OT group   Summary: 12:00 - 12:50: Occupational Therapy group with cln [name].  12:50 - 1:00 Clinician assessed for immediate needs, medication compliance and efficacy, and safety concerns.   Therapist Response: 12:00 - 12:50: Pt participated 12:50 - 1:00 pm: At check-out, patient reports no immediate concerns. Patient demonstrates progress as evidenced by continued engagement and responsiveness to treatment. Patient denies SI/HI/self-harm thoughts at the end of group.    Suicidal/Homicidal: Nowithout intent/plan  Plan: Pt will continue in PHP while working to decrease depression and grief symptoms, increase in daily functioning, and increase in ability to manage symptoms in a healthy manner.   Collaboration of Care: Medication Management AEB Pamela Kaufmann, MD  Patient/Guardian was advised Release of Information must be obtained prior to any record release in order to collaborate their care with an outside provider. Patient/Guardian was advised if they have not already done so to contact the registration department to sign all necessary forms in order for us  to release information regarding their care.   Consent: Patient/Guardian gives verbal consent for treatment and assignment of benefits for services provided during this visit. Patient/Guardian expressed understanding and agreed to proceed.   Diagnosis: Severe episode of recurrent major depressive disorder, without psychotic features (HCC) [F33.2]    1. Severe episode of recurrent major depressive disorder, without psychotic features (HCC)   2. GAD (generalized anxiety disorder)       Randall Bastos, LCSW

## 2024-05-22 NOTE — Psych (Signed)
 Virtual Visit via Video Note  I connected with Delon LITTIE Presser on 05/05/24 at  9:00 AM EDT by a video enabled telemedicine application and verified that I am speaking with the correct person using two identifiers.  Location: Patient: patient home Provider: clinical home office   I discussed the limitations of evaluation and management by telemedicine and the availability of in person appointments. The patient expressed understanding and agreed to proceed.  I discussed the assessment and treatment plan with the patient. The patient was provided an opportunity to ask questions and all were answered. The patient agreed with the plan and demonstrated an understanding of the instructions.   The patient was advised to call back or seek an in-person evaluation if the symptoms worsen or if the condition fails to improve as anticipated.  Pt was provided 180 minutes of non-face-to-face time during this encounter.   Randall Bastos, LCSW   [facility] PHP THERAPIST PROGRESS NOTE  ERION HERMANS [id number]  Session Time: 9:00 - 10:00  Participation Level: Active  Behavioral Response: CasualAlertDepressed  Type of Therapy: Group Therapy  Treatment Goals addressed: Coping  Progress Towards Goals: Progressing  Interventions: CBT, DBT, Supportive, and Reframing  Summary: JONIYA BOBERG is a 38 y.o. female who presents with depression and grief symptoms.  Clinician led check-in regarding current stressors and situation, and review of patient completed daily inventory. Clinician utilized active listening and empathetic response and validated patient emotions. Clinician facilitated processing group on pertinent issues.   Therapist Response: Patient arrived within time allowed. Patient rates her mood at a 1 on a scale of 1-10 with 10 being best. Pt states she feels bad. Pt states she slept 4 off/on hours and ate 1x. Pt reports she is in a great deal of pain and was at the doctor yesterday for  bladder concerns and was catheterized which has created pain. Pt able to process. Pt engaged in discussion.          Session Time: 10:00 am - 11:00 am   Participation Level: Active   Behavioral Response: CasualAlertDepressed   Type of Therapy: Group Therapy   Treatment Goals addressed: Coping   Progress Towards Goals: Progressing   Interventions: CBT, DBT, Solution Focused, Strength-based, Supportive, and Reframing   Summary: Cln led processing group for pt's current struggles. Group members shared stressors and provided support and feedback. Cln brought in topics of boundaries, healthy relationships, and unhealthy thought processes to inform discussion.    Therapist Response: Pt able to process and provide support to group.              Session Time: 11:00 -12:00   Participation Level: Active   Behavioral Response: CasualAlertDepressed   Type of Therapy: Group Therapy   Treatment Goals addressed: Coping   Progress Towards Goals: Progressing   Interventions: CBT, DBT, Solution Focused, Strength-based, Supportive, and Reframing   Summary: Cln led discussion on emotional reasoning and provided context in terms of CBT unhealthy thought patterns. Cln encouraged pt's to be more specific in their speech and thought dialogues to highlight the presence of a feeling, a mutable and time limited experience. Group members were encouraged to use reminder: feelings do not equal fact.  Therapist Response:  Pt engaged in discussion and is able to identify patterns in which they struggle to differentiate feelings and fact.     **Pt chose to leave group at 12, citing pain. Pt denies SI before leaving session.       Suicidal/Homicidal: Nowithout  intent/plan  Plan: Pt will continue in PHP while working to decrease depression and grief symptoms, increase in daily functioning, and increase in ability to manage symptoms in a healthy manner.   Collaboration of Care: Medication  Management AEB LOISE Kaufmann, MD  Patient/Guardian was advised Release of Information must be obtained prior to any record release in order to collaborate their care with an outside provider. Patient/Guardian was advised if they have not already done so to contact the registration department to sign all necessary forms in order for us  to release information regarding their care.   Consent: Patient/Guardian gives verbal consent for treatment and assignment of benefits for services provided during this visit. Patient/Guardian expressed understanding and agreed to proceed.   Diagnosis: Severe episode of recurrent major depressive disorder, without psychotic features (HCC) [F33.2]    1. Severe episode of recurrent major depressive disorder, without psychotic features (HCC)   2. GAD (generalized anxiety disorder)       Randall Bastos, LCSW

## 2024-05-22 NOTE — Psych (Signed)
 Virtual Visit via Video Note  I connected with Pamela Calhoun on 05/11/24 at  9:00 AM EDT by a video enabled telemedicine application and verified that I am speaking with the correct person using two identifiers.  Location: Patient: patient home Provider: clinical home office   I discussed the limitations of evaluation and management by telemedicine and the availability of in person appointments. The patient expressed understanding and agreed to proceed.  I discussed the assessment and treatment plan with the patient. The patient was provided an opportunity to ask questions and all were answered. The patient agreed with the plan and demonstrated an understanding of the instructions.   The patient was advised to call back or seek an in-person evaluation if the symptoms worsen or if the condition fails to improve as anticipated.  Pt was provided 240 minutes of non-face-to-face time during this encounter.   Randall Bastos, LCSW   [facility] PHP THERAPIST PROGRESS NOTE  Pamela Calhoun [id number]  Session Time: 9:00 - 10:00  Participation Level: Active  Behavioral Response: CasualAlertDepressed  Type of Therapy: Group Therapy  Treatment Goals addressed: Coping  Progress Towards Goals: Progressing  Interventions: CBT, DBT, Supportive, and Reframing  Summary: Pamela Calhoun is a 38 y.o. female who presents with depression and grief symptoms.  Clinician led check-in regarding current stressors and situation, and review of patient completed daily inventory. Clinician utilized active listening and empathetic response and validated patient emotions. Clinician facilitated processing group on pertinent issues.   Therapist Response: Patient arrived within time allowed. Patient rates her mood at a 2 on a scale of 1-10 with 10 being best. Pt states she feels more anxious. Pt states she slept 4 hours and ate 2x. Pt reports struggling with heightened anxiety throughout the day which she  attributes to continued processing of her medical issues, grief, and regret from her alcohol binge. Pt states spending the day with her god daughter watching a tv show. Pt states crying off/on throughout the day. Pt states she feels she is disappointing her mom and is able to challenge that thought with successfully with cueing form cln.  Pt able to process. Pt engaged in discussion.          Session Time: 10:00 am - 11:00 am   Participation Level: Active   Behavioral Response: CasualAlertDepressed   Type of Therapy: Group Therapy   Treatment Goals addressed: Coping   Progress Towards Goals: Progressing   Interventions: CBT, DBT, Solution Focused, Strength-based, Supportive, and Reframing   Summary: Cln led discussion on unrealistic expectations and the ways expectations can alter perspective. Group members discussed feelings and situations that have occurred due to expectation and how to know if they are unrealistic. Cln brought in CBT thought challenging and reframing to aid discussion.    Therapist Response: Pt engaged in discussion and reports gaining insight.              Session Time: 11:00 -12:00   Participation Level: Active   Behavioral Response: CasualAlertDepressed   Type of Therapy: Group Therapy   Treatment Goals addressed: Coping   Progress Towards Goals: Progressing   Interventions: CBT, DBT, Solution Focused, Strength-based, Supportive, and Reframing   Summary: Cln continued topic of boundaries. Cln discussed the different ways boundaries present: physical, emotional, intellectual, sexual, material, and time. Group talked about the ways in which each type presents for them and is a struggle.    Therapist Response:  Pt engaged in discussion and identified struggles with each  type.                 Session Time: 12:00 -1:00   Participation Level: Active   Behavioral Response: CasualAlertDepressed   Type of Therapy: Group therapy   Treatment  Goals addressed: Coping   Progress Towards Goals: Progressing   Interventions: OT group   Summary: 12:00 - 12:50: Occupational Therapy group with cln [name].  12:50 - 1:00 Clinician assessed for immediate needs, medication compliance and efficacy, and safety concerns.   Therapist Response: 12:00 - 12:50: Pt participated 12:50 - 1:00 pm: At check-out, patient reports no immediate concerns. Patient demonstrates progress as evidenced by continued engagement and responsiveness to treatment. Patient denies SI/HI/self-harm thoughts at the end of group.    Suicidal/Homicidal: Nowithout intent/plan  Plan: Pt will continue in PHP while working to decrease depression and grief symptoms, increase in daily functioning, and increase in ability to manage symptoms in a healthy manner.   Collaboration of Care: Medication Management AEB LOISE Kaufmann, MD  Patient/Guardian was advised Release of Information must be obtained prior to any record release in order to collaborate their care with an outside provider. Patient/Guardian was advised if they have not already done so to contact the registration department to sign all necessary forms in order for us  to release information regarding their care.   Consent: Patient/Guardian gives verbal consent for treatment and assignment of benefits for services provided during this visit. Patient/Guardian expressed understanding and agreed to proceed.   Diagnosis: Severe episode of recurrent major depressive disorder, without psychotic features (HCC) [F33.2]    1. Severe episode of recurrent major depressive disorder, without psychotic features (HCC)   2. GAD (generalized anxiety disorder)       Randall Bastos, LCSW

## 2024-05-22 NOTE — Psych (Signed)
 Virtual Visit via Video Note  I connected with Delon LITTIE Presser on 05/10/24 at  9:00 AM EDT by a video enabled telemedicine application and verified that I am speaking with the correct person using two identifiers.  Location: Patient: patient home Provider: clinical home office   I discussed the limitations of evaluation and management by telemedicine and the availability of in person appointments. The patient expressed understanding and agreed to proceed.  I discussed the assessment and treatment plan with the patient. The patient was provided an opportunity to ask questions and all were answered. The patient agreed with the plan and demonstrated an understanding of the instructions.   The patient was advised to call back or seek an in-person evaluation if the symptoms worsen or if the condition fails to improve as anticipated.  Pt was provided 240 minutes of non-face-to-face time during this encounter.   Randall Bastos, LCSW   [facility] PHP THERAPIST PROGRESS NOTE  RAYNAH GOMES [id number]  Session Time: 9:00 - 10:00  Participation Level: Active  Behavioral Response: CasualAlertDepressed  Type of Therapy: Group Therapy  Treatment Goals addressed: Coping  Progress Towards Goals: Progressing  Interventions: CBT, DBT, Supportive, and Reframing  Summary: OREE MIRELEZ is a 38 y.o. female who presents with depression and grief symptoms.  Clinician led check-in regarding current stressors and situation, and review of patient completed daily inventory. Clinician utilized active listening and empathetic response and validated patient emotions. Clinician facilitated processing group on pertinent issues.   Therapist Response: Patient arrived within time allowed. Patient rates her mood at a 3 on a scale of 1-10 with 10 being best. Pt states she feels like trash. Pt states she slept 4.5 hours and ate 2x. Pt reports after group yesterday, she went to her mother's grave and got  drunk. Pt states drinking a pt of liquor and a bottle of wine. Pt states she was thinking about her cancer results and her mom's absence. Pt reports having a hangover this morning and regretting her choices. Pt reports it's good in the moment but it doesn't solve anything. It just makes it worse. Pt is able to do a verbal choice analysis chain with cln and identify other ways to handle big feelings in the future. Cln encourages pt to share her medical news with dad so that he can be a support for her and pt will consider this. Pt able to process. Pt engaged in discussion.          Session Time: 10:00 am - 11:00 am   Participation Level: Active   Behavioral Response: CasualAlertDepressed   Type of Therapy: Group Therapy   Treatment Goals addressed: Coping   Progress Towards Goals: Progressing   Interventions: CBT, DBT, Solution Focused, Strength-based, Supportive, and Reframing   Summary: Cln led discussion on feelings and the role they play for our lives. Cln provided DBT wise mind framework to discuss facts about feelings and how to utilize these facts to help contextualize the feelings which we experience.    Therapist Response: Pt engaged in discussion and reports understanding.              Session Time: 11:00 -12:00   Participation Level: Active   Behavioral Response: CasualAlertDepressed   Type of Therapy: Group Therapy   Treatment Goals addressed: Coping   Progress Towards Goals: Progressing   Interventions: CBT, DBT, Solution Focused, Strength-based, Supportive, and Reframing   Summary: Cln introduced topic of boundaries. Cln discussed how boundaries inform our relationships  and affect self-esteem and personal agency. Group discussed the three types of boundaries: rigid, porous, and healthy and when each type is most helpful/harmful.    Therapist Response:  Pt engaged in discussion and is able to identify each type in their life.                Session  Time: 12:00 -1:00   Participation Level: Active   Behavioral Response: CasualAlertDepressed   Type of Therapy: Group therapy   Treatment Goals addressed: Coping   Progress Towards Goals: Progressing   Interventions: OT group   Summary: 12:00 - 12:50: Occupational Therapy group with cln [name].  12:50 - 1:00 Clinician assessed for immediate needs, medication compliance and efficacy, and safety concerns.   Therapist Response: 12:00 - 12:50: Pt participated 12:50 - 1:00 pm: At check-out, patient reports no immediate concerns. Patient demonstrates progress as evidenced by continued engagement and responsiveness to treatment. Patient denies SI/HI/self-harm thoughts at the end of group.    Suicidal/Homicidal: Nowithout intent/plan  Plan: Pt will continue in PHP while working to decrease depression and grief symptoms, increase in daily functioning, and increase in ability to manage symptoms in a healthy manner.   Collaboration of Care: Medication Management AEB LOISE Kaufmann, MD  Patient/Guardian was advised Release of Information must be obtained prior to any record release in order to collaborate their care with an outside provider. Patient/Guardian was advised if they have not already done so to contact the registration department to sign all necessary forms in order for us  to release information regarding their care.   Consent: Patient/Guardian gives verbal consent for treatment and assignment of benefits for services provided during this visit. Patient/Guardian expressed understanding and agreed to proceed.   Diagnosis: Severe episode of recurrent major depressive disorder, without psychotic features (HCC) [F33.2]    1. Severe episode of recurrent major depressive disorder, without psychotic features (HCC)   2. GAD (generalized anxiety disorder)       Randall Bastos, LCSW

## 2024-05-22 NOTE — Psych (Signed)
 Virtual Visit via Video Note  I connected with Pamela Calhoun on 05/08/24 at  9:00 AM EDT by a video enabled telemedicine application and verified that I am speaking with the correct person using two identifiers.  Location: Patient: patient home Provider: clinical home office   I discussed the limitations of evaluation and management by telemedicine and the availability of in person appointments. The patient expressed understanding and agreed to proceed.  I discussed the assessment and treatment plan with the patient. The patient was provided an opportunity to ask questions and all were answered. The patient agreed with the plan and demonstrated an understanding of the instructions.   The patient was advised to call back or seek an in-person evaluation if the symptoms worsen or if the condition fails to improve as anticipated.  Pt was provided 240 minutes of non-face-to-face time during this encounter.   Randall Bastos, LCSW   [facility] BH PHP THERAPIST PROGRESS NOTE  Pamela Calhoun [id number]  Session Time: 9:00 - 10:00  Participation Level: Active  Behavioral Response: CasualAlertDepressed  Type of Therapy: Group Therapy  Treatment Goals addressed: Coping  Progress Towards Goals: Progressing  Interventions: CBT, DBT, Supportive, and Reframing  Summary: Pamela Calhoun is a 38 y.o. female who presents with depression and grief symptoms.  Clinician led check-in regarding current stressors and situation, and review of patient completed daily inventory. Clinician utilized active listening and empathetic response and validated patient emotions. Clinician facilitated processing group on pertinent issues.   Therapist Response: Patient arrived within time allowed. Patient rates her mood at a 3 on a scale of 1-10 with 10 being best. Pt states she feels okay. Pt states she slept 5 hours and ate 2x. Pt reports she has brushed her teeth 3 days in a row which is a success. Pt shares  she received physical test results yesterday and they are upsetting but she would like to process her feelings more before sharing them. Pt able to process. Pt engaged in discussion.          Session Time: 10:00 am - 11:00 am   Participation Level: Active   Behavioral Response: CasualAlertDepressed   Type of Therapy: Group Therapy   Treatment Goals addressed: Coping   Progress Towards Goals: Progressing   Interventions: CBT, DBT, Solution Focused, Strength-based, Supportive, and Reframing   Summary: Cln led processing group for pt's current struggles. Group members shared stressors and provided support and feedback. Cln brought in topics of boundaries, healthy relationships, and unhealthy thought processes to inform discussion.    Therapist Response: Pt able to process and provide support to group.              Session Time: 11:00 -12:00   Participation Level: Active   Behavioral Response: CasualAlertDepressed   Type of Therapy: Group Therapy   Treatment Goals addressed: Coping   Progress Towards Goals: Progressing   Interventions: CBT, DBT, Solution Focused, Strength-based, Supportive, and Reframing   Summary: Cln continued topic of CBT cognitive distortions and utilized handout Unhealthy Thought Patterns to review common examples of distorted thought to increase awareness of the distorted thoughts.   Therapist Response:  Pt engaged in discussion and is able to make connections from their life to the distorted thoughts.                 Session Time: 12:00 -1:00   Participation Level: Active   Behavioral Response: CasualAlertDepressed   Type of Therapy: Group therapy   Treatment Goals addressed:  Coping   Progress Towards Goals: Progressing   Interventions: OT group   Summary: 12:00 - 12:50: Occupational Therapy group with cln [name].  12:50 - 1:00 Clinician assessed for immediate needs, medication compliance and efficacy, and safety concerns.    Therapist Response: 12:00 - 12:50: Pt participated 12:50 - 1:00 pm: At check-out, patient reports no immediate concerns. Patient demonstrates progress as evidenced by continued engagement and responsiveness to treatment. Patient denies SI/HI/self-harm thoughts at the end of group.    Suicidal/Homicidal: Nowithout intent/plan  Plan: Pt will continue in PHP while working to decrease depression and grief symptoms, increase in daily functioning, and increase in ability to manage symptoms in a healthy manner.   Collaboration of Care: Medication Management AEB LOISE Kaufmann, MD  Patient/Guardian was advised Release of Information must be obtained prior to any record release in order to collaborate their care with an outside provider. Patient/Guardian was advised if they have not already done so to contact the registration department to sign all necessary forms in order for us  to release information regarding their care.   Consent: Patient/Guardian gives verbal consent for treatment and assignment of benefits for services provided during this visit. Patient/Guardian expressed understanding and agreed to proceed.   Diagnosis: Severe episode of recurrent major depressive disorder, without psychotic features (HCC) [F33.2]    1. Severe episode of recurrent major depressive disorder, without psychotic features (HCC)   2. GAD (generalized anxiety disorder)       Randall Bastos, LCSW

## 2024-05-22 NOTE — Psych (Signed)
 Virtual Visit via Video Note  I connected with Pamela Calhoun on 05/03/24 at  9:00 AM EDT by a video enabled telemedicine application and verified that I am speaking with the correct person using two identifiers.  Location: Patient: patient home Provider: clinical home office   I discussed the limitations of evaluation and management by telemedicine and the availability of in person appointments. The patient expressed understanding and agreed to proceed.  I discussed the assessment and treatment plan with the patient. The patient was provided an opportunity to ask questions and all were answered. The patient agreed with the plan and demonstrated an understanding of the instructions.   The patient was advised to call back or seek an in-person evaluation if the symptoms worsen or if the condition fails to improve as anticipated.  Pt was provided 240 minutes of non-face-to-face time during this encounter.   Randall Bastos, LCSW   [facility] BH PHP THERAPIST PROGRESS NOTE  Pamela Calhoun [id number]  Session Time: 9:00 - 10:00  Participation Level: Active  Behavioral Response: CasualAlertDepressed  Type of Therapy: Group Therapy  Treatment Goals addressed: Coping  Progress Towards Goals: Progressing  Interventions: CBT, DBT, Supportive, and Reframing  Summary: Pamela Calhoun is a 38 y.o. female who presents with depression and grief symptoms.  Clinician led check-in regarding current stressors and situation, and review of patient completed daily inventory. Clinician utilized active listening and empathetic response and validated patient emotions. Clinician facilitated processing group on pertinent issues.   Therapist Response: Patient arrived within time allowed. Patient rates her mood at a 4 on a scale of 1-10 with 10 being best. Pt states she feels funky. Pt states she slept 7 hours and ate 1x. Pt reports working on packing up the house which stirred up grief. Pt states it  feels like I'm throwing away my mom. Pt was open to alternatives to throwing away items in the house at this time. Pt shares increased chore activity to keep myself busy after the grief attack. Pt able to process. Pt engaged in discussion.          Session Time: 10:00 am - 11:00 am   Participation Level: Active   Behavioral Response: CasualAlertDepressed   Type of Therapy: Group Therapy   Treatment Goals addressed: Coping   Progress Towards Goals: Progressing   Interventions: CBT, DBT, Solution Focused, Strength-based, Supportive, and Reframing   Summary: Cln led processing group for pt's current struggles. Group members shared stressors and provided support and feedback. Cln brought in topics of boundaries, healthy relationships, and unhealthy thought processes to inform discussion.   Therapist Response: Pt able to process and provide support to group.              Session Time: 11:00 -12:00   Participation Level: Active   Behavioral Response: CasualAlertDepressed   Type of Therapy: Group Therapy   Treatment Goals addressed: Coping   Progress Towards Goals: Progressing   Interventions: Strength-based, Supportive, and Reframing   Summary: Chaplaincy group with [name]   Therapist Response: Pt participated and engaged in discussion.                Session Time: 12:00 -1:00   Participation Level: Active   Behavioral Response: CasualAlertDepressed   Type of Therapy: Group therapy   Treatment Goals addressed: Coping   Progress Towards Goals: Progressing   Interventions: OT group   Summary: 12:00 - 12:50: Occupational Therapy group with cln [name].  12:50 - 1:00 Clinician  assessed for immediate needs, medication compliance and efficacy, and safety concerns.   Therapist Response: 12:00 - 12:50: Pt participated 12:50 - 1:00 pm: At check-out, patient reports no immediate concerns. Patient demonstrates progress as evidenced by continued engagement  and responsiveness to treatment. Patient denies SI/HI/self-harm thoughts at the end of group.    Suicidal/Homicidal: Nowithout intent/plan  Plan: Pt will continue in PHP while working to decrease depression and grief symptoms, increase in daily functioning, and increase in ability to manage symptoms in a healthy manner.   Collaboration of Care: Medication Management AEB LOISE Kaufmann, MD  Patient/Guardian was advised Release of Information must be obtained prior to any record release in order to collaborate their care with an outside provider. Patient/Guardian was advised if they have not already done so to contact the registration department to sign all necessary forms in order for us  to release information regarding their care.   Consent: Patient/Guardian gives verbal consent for treatment and assignment of benefits for services provided during this visit. Patient/Guardian expressed understanding and agreed to proceed.   Diagnosis: Severe episode of recurrent major depressive disorder, without psychotic features (HCC) [F33.2]    1. Severe episode of recurrent major depressive disorder, without psychotic features (HCC)   2. GAD (generalized anxiety disorder)       Randall Bastos, LCSW

## 2024-05-22 NOTE — Psych (Signed)
 Virtual Visit via Video Note  I connected with Pamela Calhoun on 05/01/24 at  9:00 AM EDT by a video enabled telemedicine application and verified that I am speaking with the correct person using two identifiers.  Location: Patient: patient home Provider: clinical home office   I discussed the limitations of evaluation and management by telemedicine and the availability of in person appointments. The patient expressed understanding and agreed to proceed.  I discussed the assessment and treatment plan with the patient. The patient was provided an opportunity to ask questions and all were answered. The patient agreed with the plan and demonstrated an understanding of the instructions.   The patient was advised to call back or seek an in-person evaluation if the symptoms worsen or if the condition fails to improve as anticipated.  Pt was provided 240 minutes of non-face-to-face time during this encounter.   Randall Bastos, LCSW   [facility] BH PHP THERAPIST PROGRESS NOTE  Pamela Calhoun [id number]  Session Time: 9:00 - 10:00  Participation Level: Active  Behavioral Response: CasualAlertDepressed  Type of Therapy: Group Therapy  Treatment Goals addressed: Coping  Progress Towards Goals: Progressing  Interventions: CBT, DBT, Supportive, and Reframing  Summary: Pamela Calhoun is a 38 y.o. female who presents with depression and grief symptoms.  Clinician led check-in regarding current stressors and situation, and review of patient completed daily inventory. Clinician utilized active listening and empathetic response and validated patient emotions. Clinician facilitated processing group on pertinent issues.   Therapist Response: Patient arrived within time allowed. Patient rates her mood at a 4.5 on a scale of 1-10 with 10 being best. Pt states she feels okay. Pt states she slept 6.5 hours and ate 2x. Pt reports she is feeling mostly better from her physical illness. Pt states  she is on antibiotics and they seem to be working. Pt states her family is selling her mother's house and pt is moving to [facility]. Pt states she is very conflicted about selling mom's house, but overall thinks this is a positive plan. Pt reports anxiety about packing up the house and grief that will climb. Pt reports struggling with memory.  Pt able to process. Pt engaged in discussion.          Session Time: 10:00 am - 11:00 am   Participation Level: Active   Behavioral Response: CasualAlertDepressed   Type of Therapy: Group Therapy   Treatment Goals addressed: Coping   Progress Towards Goals: Progressing   Interventions: CBT, DBT, Solution Focused, Strength-based, Supportive, and Reframing   Summary: Cln led processing group for pt's current struggles. Group members shared stressors and provided support and feedback. Cln brought in topics of boundaries, healthy relationships, and unhealthy thought processes to inform discussion.    Therapist Response: Pt able to process and provide support to group.            Session Time: 11:00 -12:00   Participation Level: Active   Behavioral Response: CasualAlertDepressed   Type of Therapy: Group Therapy   Treatment Goals addressed: Coping   Progress Towards Goals: Progressing   Interventions: CBT, DBT, Solution Focused, Strength-based, Supportive, and Reframing   Summary: Cln led activity on finding what makes you happy. Cln tasked group members to consider times in which they have been happy in the past, moments in which they have felt less distressed recently, and things that give them any rumblings of joy. Cln worked with pt's to process barriers and to consider one thing for now,  not the bigger picture.    Therapist Response:  Pt engaged in discussion and struggled to think of times she has been happy. Pt able to process and come up with one thing thing to do to increase happiness.                  Session Time:  12:00 -1:00   Participation Level: Active   Behavioral Response: CasualAlertDepressed   Type of Therapy: Group therapy   Treatment Goals addressed: Coping   Progress Towards Goals: Progressing   Interventions: OT group   Summary: 12:00 - 12:50: Occupational Therapy group with cln [name].  12:50 - 1:00 Clinician assessed for immediate needs, medication compliance and efficacy, and safety concerns.   Therapist Response: 12:00 - 12:50: Pt participated 12:50 - 1:00 pm: At check-out, patient reports no immediate concerns. Patient demonstrates progress as evidenced by continued engagement and responsiveness to treatment. Patient denies SI/HI/self-harm thoughts at the end of group.    Suicidal/Homicidal: Nowithout intent/plan  Plan: Pt will continue in PHP while working to decrease depression and grief symptoms, increase in daily functioning, and increase in ability to manage symptoms in a healthy manner.   Collaboration of Care: Medication Management AEB LOISE Kaufmann, MD  Patient/Guardian was advised Release of Information must be obtained prior to any record release in order to collaborate their care with an outside provider. Patient/Guardian was advised if they have not already done so to contact the registration department to sign all necessary forms in order for us  to release information regarding their care.   Consent: Patient/Guardian gives verbal consent for treatment and assignment of benefits for services provided during this visit. Patient/Guardian expressed understanding and agreed to proceed.   Diagnosis: Severe episode of recurrent major depressive disorder, without psychotic features (HCC) [F33.2]    1. Severe episode of recurrent major depressive disorder, without psychotic features (HCC)   2. GAD (generalized anxiety disorder)       Randall Bastos, LCSW

## 2024-05-22 NOTE — Psych (Signed)
 Pt on schedule in error. Pt absent from PHP.

## 2024-05-22 NOTE — Psych (Signed)
 Virtual Visit via Video Note  I connected with Pamela Calhoun on 05/02/24 at  9:00 AM EDT by a video enabled telemedicine application and verified that I am speaking with the correct person using two identifiers.  Location: Patient: patient home Provider: clinical home office   I discussed the limitations of evaluation and management by telemedicine and the availability of in person appointments. The patient expressed understanding and agreed to proceed.  I discussed the assessment and treatment plan with the patient. The patient was provided an opportunity to ask questions and all were answered. The patient agreed with the plan and demonstrated an understanding of the instructions.   The patient was advised to call back or seek an in-person evaluation if the symptoms worsen or if the condition fails to improve as anticipated.  Pt was provided 240 minutes of non-face-to-face time during this encounter.   Randall Bastos, LCSW   [facility] BH PHP THERAPIST PROGRESS NOTE  Pamela Calhoun [id number]  Session Time: 9:00 - 10:00  Participation Level: Active  Behavioral Response: CasualAlertDepressed  Type of Therapy: Group Therapy  Treatment Goals addressed: Coping  Progress Towards Goals: Progressing  Interventions: CBT, DBT, Supportive, and Reframing  Summary: Pamela Calhoun is a 38 y.o. female who presents with depression and grief symptoms.  Clinician led check-in regarding current stressors and situation, and review of patient completed daily inventory. Clinician utilized active listening and empathetic response and validated patient emotions. Clinician facilitated processing group on pertinent issues.   Therapist Response: Patient arrived within time allowed. Patient rates her mood at a 3 on a scale of 1-10 with 10 being best. Pt states she feels up and down. Pt states she slept 3 hours and ate 3x. Pt reports feelings are building up and she feels labile. Pt  reports  decreased emotional regulation and that she lost it with her god children and did impulse shopping. Pt reports understanding of the way in which emotion regulation plays a role in these behaviors. Pt able to process. Pt engaged in discussion.          Session Time: 10:00 am - 11:00 am   Participation Level: Active   Behavioral Response: CasualAlertDepressed   Type of Therapy: Group Therapy   Treatment Goals addressed: Coping   Progress Towards Goals: Progressing   Interventions: CBT, DBT, Solution Focused, Strength-based, Supportive, and Reframing   Summary: Cln led discussion on how to fill unplanned time. Group members shared ways in which downtime negatively impacts mental health and often causes them to dwell on negative thinking. Group brainstormed ways to manage down time and problem solved how to handle barriers.    Therapist Response:  Pt engaged in discussion           Session Time: 11:00 -12:00   Participation Level: Active   Behavioral Response: CasualAlertDepressed   Type of Therapy: Group Therapy   Treatment Goals addressed: Coping   Progress Towards Goals: Progressing   Interventions: CBT, DBT, Solution Focused, Strength-based, Supportive, and Reframing   Summary: Cln introduced topic of stress management and the model of the 4 A's of stress management: avoid, alter, accept, and adapt. Group members worked through Engineer, structural and discussed barriers to utilizing the 4 A's for stressors.    Therapist Response:  Pt engaged in discussion and reports understanding of how to utilize the 4 A's.              Session Time: 12:00 -1:00   Participation Level: Active  Behavioral Response: CasualAlertDepressed   Type of Therapy: Group therapy   Treatment Goals addressed: Coping   Progress Towards Goals: Progressing   Interventions: OT group   Summary: 12:00 - 12:50: Occupational Therapy group with cln [name].  12:50 - 1:00 Clinician assessed for  immediate needs, medication compliance and efficacy, and safety concerns.   Therapist Response: 12:00 - 12:50: Pt participated 12:50 - 1:00 pm: At check-out, patient reports no immediate concerns. Patient demonstrates progress as evidenced by continued engagement and responsiveness to treatment. Patient denies SI/HI/self-harm thoughts at the end of group.    Suicidal/Homicidal: Nowithout intent/plan  Plan: Pt will continue in PHP while working to decrease depression and grief symptoms, increase in daily functioning, and increase in ability to manage symptoms in a healthy manner.   Collaboration of Care: Medication Management AEB Pamela Kaufmann, MD  Patient/Guardian was advised Release of Information must be obtained prior to any record release in order to collaborate their care with an outside provider. Patient/Guardian was advised if they have not already done so to contact the registration department to sign all necessary forms in order for us  to release information regarding their care.   Consent: Patient/Guardian gives verbal consent for treatment and assignment of benefits for services provided during this visit. Patient/Guardian expressed understanding and agreed to proceed.   Diagnosis: Severe episode of recurrent major depressive disorder, without psychotic features (HCC) [F33.2]    1. Severe episode of recurrent major depressive disorder, without psychotic features (HCC)   2. GAD (generalized anxiety disorder)       Randall Bastos, LCSW

## 2024-05-22 NOTE — Psych (Signed)
 Pt on schedule in error. Pt is absent from PHP.

## 2024-05-23 NOTE — Psych (Addendum)
 Virtual Visit via Video Note  I connected with Pamela Calhoun on 05/18/24 at  9:00 AM EDT by a video enabled telemedicine application and verified that I am speaking with the correct person using two identifiers.  Location: Patient: patient home Provider: clinical home office   I discussed the limitations of evaluation and management by telemedicine and the availability of in person appointments. The patient expressed understanding and agreed to proceed.  I discussed the assessment and treatment plan with the patient. The patient was provided an opportunity to ask questions and all were answered. The patient agreed with the plan and demonstrated an understanding of the instructions.   The patient was advised to call back or seek an in-person evaluation if the symptoms worsen or if the condition fails to improve as anticipated.  Pt was provided 240 minutes of non-face-to-face time during this encounter.   Randall Bastos, LCSW   [facility] BH PHP THERAPIST PROGRESS NOTE  Pamela Calhoun [id number]  Session Time: 9:00 - 10:00  Participation Level: Active  Behavioral Response: CasualAlertDepressed  Type of Therapy: Group Therapy  Treatment Goals addressed: Coping  Progress Towards Goals: Progressing  Interventions: CBT, DBT, Supportive, and Reframing  Summary: Pamela Calhoun is a 38 y.o. female who presents with depression and grief symptoms.  Clinician led check-in regarding current stressors and situation, and review of patient completed daily inventory. Clinician utilized active listening and empathetic response and validated patient emotions. Clinician facilitated processing group on pertinent issues.   Therapist Response: Patient arrived within time allowed. Patient rates her mood at a 4 on a scale of 1-10 with 10 being best. Pt states she feels tired. Pt states she slept 6 hours and ate 2x. Pt reports anxiety increase yesterday and this morning due to discharge today. Pt  states feeling ready, but really nervious. Pt shares three different situations yesterday in which she used tools learned in group to manage big feelings. Pt is able to acknowledge progress and states she feels proud of herself.  Pt able to process. Pt engaged in discussion.          Session Time: 10:00 am - 11:00 am   Participation Level: Active   Behavioral Response: CasualAlertDepressed   Type of Therapy: Group Therapy   Treatment Goals addressed: Coping   Progress Towards Goals: Progressing   Interventions: CBT, DBT, Solution Focused, Strength-based, Supportive, and Reframing   Summary: Cln introduced DBT distress tolerance skill IMPROVE.  Cln discussed how this set of skills are for when you have to sit through an undesirable feeling and wait for it to pass. Group discussed how to apply the IMPROVE skills to decrease distress at the undesired feeling.    Therapist Response: Pt engaged in discussion and is able to identify ways to apply the skill.          Session Time: 11:00 -12:00   Participation Level: Active   Behavioral Response: CasualAlertDepressed   Type of Therapy: Group Therapy   Treatment Goals addressed: Coping   Progress Towards Goals: Progressing   Interventions: CBT, DBT, Solution Focused, Strength-based, Supportive, and Reframing   Summary: Cln introduced topic of DBT distress tolerance skills. Cln provided context for distress tolerance skills and how to practice them. Cln introduced the ACCEPTS distraction skills and group discusses ways to utilize A activities.    Therapist Response:  Pt engaged in discussion and is able to state ways to apply this skill.  Session Time: 12:00 -1:00   Participation Level: Active   Behavioral Response: CasualAlertDepressed   Type of Therapy: Group therapy   Treatment Goals addressed: Coping   Progress Towards Goals: Progressing   Interventions: OT group   Summary: 12:00 - 12:50:  Occupational Therapy group with cln [name].  12:50 - 1:00 Clinician assessed for immediate needs, medication compliance and efficacy, and safety concerns.   Therapist Response: 12:00 - 12:50: Pt participated 12:50 - 1:00 pm: At check-out, patient reports no immediate concerns. Patient demonstrates progress as evidenced by continued engagement and responsiveness to treatment. Patient denies SI/HI/self-harm thoughts at the end of group.    Suicidal/Homicidal: Nowithout intent/plan  Plan: Pt will discharge from PHP due to meeting treatment goals of decreased depression and grief symptoms, increased daily functioning, and increased ability to manage symptoms in a healthy manner. Pt will step down to individual therapy/psychiatry at the [facility] OP  office. Pt is also encouraged to utilize grief and mental health support groups in the area. Pt and provider are aligned with discharge plan. Pt denies SI/HI at time of discharge.   Collaboration of Care: Medication Management AEB LOISE Kaufmann, MD  Patient/Guardian was advised Release of Information must be obtained prior to any record release in order to collaborate their care with an outside provider. Patient/Guardian was advised if they have not already done so to contact the registration department to sign all necessary forms in order for us  to release information regarding their care.   Consent: Patient/Guardian gives verbal consent for treatment and assignment of benefits for services provided during this visit. Patient/Guardian expressed understanding and agreed to proceed.   Diagnosis: Severe episode of recurrent major depressive disorder, without psychotic features (HCC) [F33.2]    1. Severe episode of recurrent major depressive disorder, without psychotic features (HCC)   2. GAD (generalized anxiety disorder)       Randall Bastos, LCSW

## 2024-05-23 NOTE — Psych (Signed)
 Virtual Visit via Video Note  I connected with Pamela Calhoun on 05/16/24 at  9:00 AM EDT by a video enabled telemedicine application and verified that I am speaking with the correct person using two identifiers.  Location: Patient: patient home Provider: clinical home office   I discussed the limitations of evaluation and management by telemedicine and the availability of in person appointments. The patient expressed understanding and agreed to proceed.  I discussed the assessment and treatment plan with the patient. The patient was provided an opportunity to ask questions and all were answered. The patient agreed with the plan and demonstrated an understanding of the instructions.   The patient was advised to call back or seek an in-person evaluation if the symptoms worsen or if the condition fails to improve as anticipated.  Pt was provided 240 minutes of non-face-to-face time during this encounter.   Randall Bastos, LCSW   [facility] BH PHP THERAPIST PROGRESS NOTE  Pamela Calhoun [id number]  Session Time: 9:00 - 10:00  Participation Level: Active  Behavioral Response: CasualAlertDepressed  Type of Therapy: Group Therapy  Treatment Goals addressed: Coping  Progress Towards Goals: Progressing  Interventions: CBT, DBT, Supportive, and Reframing  Summary: Pamela Calhoun is a 38 y.o. female who presents with depression and grief symptoms.  Clinician led check-in regarding current stressors and situation, and review of patient completed daily inventory. Clinician utilized active listening and empathetic response and validated patient emotions. Clinician facilitated processing group on pertinent issues.   Therapist Response: Patient arrived within time allowed. Patient rates her mood at a 3 on a scale of 1-10 with 10 being best. Pt states she feels tired. Pt states she slept 4 hours and ate 1x. Pt reports anxiety and rumination plagued her throughout the afternoon and into this  morning and interfered with her sleep. Pt states taking an early morning walk with her god daughter when she couldn't go back to sleep due to rumination and it helped clear her head. Pt continues to struggle with applying skills. Pt able to process. Pt engaged in discussion.          Session Time: 10:00 am - 11:00 am   Participation Level: Active   Behavioral Response: CasualAlertDepressed   Type of Therapy: Group Therapy   Treatment Goals addressed: Coping   Progress Towards Goals: Progressing   Interventions: CBT, DBT, Solution Focused, Strength-based, Supportive, and Reframing   Summary: Cln led discussion on rumination: and how it can affect us  negatively. Group members share topics, situations, and times in which rumination is most problem problematic for them. Cln encouraged pt's to consider DBT distraction and STOP skills or CBT thought challenging to manage rumination.    Therapist Response: Pt engaged in discussion and identifies when rumination is most problematic.           Session Time: 11:00 -12:00   Participation Level: Active   Behavioral Response: CasualAlertDepressed   Type of Therapy: Group Therapy   Treatment Goals addressed: Coping   Progress Towards Goals: Progressing   Interventions: CBT, DBT, Solution Focused, Strength-based, Supportive, and Reframing   Summary: Cln led discussion on deep breathing and its therapeutic benefits, using DBT TIPP skills to inform discussion. Group practiced how to breathe from their diaphragms to ensure therapeutic quality and different ways to keep track of regulating breaths.    Therapist Response: Pt engaged in discussion and practice.           Session Time: 12:00 -1:00   Participation  Level: Active   Behavioral Response: CasualAlertDepressed   Type of Therapy: Group therapy   Treatment Goals addressed: Coping   Progress Towards Goals: Progressing   Interventions: OT group   Summary: 12:00 - 12:50:  Occupational Therapy group with cln [name].  12:50 - 1:00 Clinician assessed for immediate needs, medication compliance and efficacy, and safety concerns.   Therapist Response: 12:00 - 12:50: Pt participated 12:50 - 1:00 pm: At check-out, patient reports no immediate concerns. Patient demonstrates progress as evidenced by continued engagement and responsiveness to treatment. Patient denies SI/HI/self-harm thoughts at the end of group.    Suicidal/Homicidal: Nowithout intent/plan  Plan: Pt will continue in PHP while working to decrease depression and grief symptoms, increase in daily functioning, and increase in ability to manage symptoms in a healthy manner.   Collaboration of Care: Medication Management AEB LOISE Kaufmann, MD  Patient/Guardian was advised Release of Information must be obtained prior to any record release in order to collaborate their care with an outside provider. Patient/Guardian was advised if they have not already done so to contact the registration department to sign all necessary forms in order for us  to release information regarding their care.   Consent: Patient/Guardian gives verbal consent for treatment and assignment of benefits for services provided during this visit. Patient/Guardian expressed understanding and agreed to proceed.   Diagnosis: Severe episode of recurrent major depressive disorder, without psychotic features (HCC) [F33.2]    1. Severe episode of recurrent major depressive disorder, without psychotic features (HCC)   2. GAD (generalized anxiety disorder)       Randall Bastos, LCSW

## 2024-05-23 NOTE — Psych (Signed)
 Virtual Visit via Video Note  I connected with Pamela Calhoun on 05/15/24 at  9:00 AM EDT by a video enabled telemedicine application and verified that I am speaking with the correct person using two identifiers.  Location: Patient: patient home Provider: clinical home office   I discussed the limitations of evaluation and management by telemedicine and the availability of in person appointments. The patient expressed understanding and agreed to proceed.  I discussed the assessment and treatment plan with the patient. The patient was provided an opportunity to ask questions and all were answered. The patient agreed with the plan and demonstrated an understanding of the instructions.   The patient was advised to call back or seek an in-person evaluation if the symptoms worsen or if the condition fails to improve as anticipated.  Pt was provided 180 minutes of non-face-to-face time during this encounter.   Randall Bastos, LCSW   [facility] BH PHP THERAPIST PROGRESS NOTE  Pamela Calhoun [id number]  Session Time: 9:00 - 10:00  Participation Level: Active  Behavioral Response: CasualAlertDepressed  Type of Therapy: Group Therapy  Treatment Goals addressed: Coping  Progress Towards Goals: Progressing  Interventions: CBT, DBT, Supportive, and Reframing  Summary: Pamela Calhoun is a 38 y.o. female who presents with depression and grief symptoms.  Clinician led check-in regarding current stressors and situation, and review of patient completed daily inventory. Clinician utilized active listening and empathetic response and validated patient emotions. Clinician facilitated processing group on pertinent issues.   Therapist Response: Patient arrived within time allowed. Patient rates her mood at a 4 on a scale of 1-10 with 10 being best. Pt states she feels recovering. Pt states she slept 5 hours and ate 3x. Pt reports she is still adjusting form the big week and the emotional toll  of the feelings. Pt states she slept most of the weekend. Pt shares she increased transparency with her god daughters and they stepped up to help more with household chores. Pt shares she also told her dad about her medical test results and her responded well with support. Pt reports continues struggle with hygiene tasks. Pt able to process. Pt engaged in discussion.          Session Time: 10:00 am - 11:00 am   Participation Level: Active   Behavioral Response: CasualAlertDepressed   Type of Therapy: Group Therapy   Treatment Goals addressed: Coping   Progress Towards Goals: Progressing   Interventions: CBT, DBT, Solution Focused, Strength-based, Supportive, and Reframing   Summary: Cln led processing group for pt's current struggles. Group members shared stressors and provided support and feedback. Cln brought in topics of boundaries, healthy relationships, and unhealthy thought processes to inform discussion.    Therapist Response: Pt able to process and provide support to group.              Session Time: 11:00 -12:00   Participation Level: Active   Behavioral Response: CasualAlertDepressed   Type of Therapy: Group Therapy   Treatment Goals addressed: Coping   Progress Towards Goals: Progressing   Interventions: CBT, DBT, Solution Focused, Strength-based, Supportive, and Reframing   Summary: Cln continued topic of boundaries and led a boundary workshop in which group members brought current boundary issues and group worked together to apply boundary concepts to help address the concern. Cln helped shape conversation to maintain fidelity.    Therapist Response: Pt engaged in discussion and shared a current boundary issue and reports gaining insight.     **Pt  chose to leave group at 12 due to a headache.    Suicidal/Homicidal: Nowithout intent/plan  Plan: Pt will continue in PHP while working to decrease depression and grief symptoms, increase in daily  functioning, and increase in ability to manage symptoms in a healthy manner.   Collaboration of Care: Medication Management AEB LOISE Kaufmann, MD  Patient/Guardian was advised Release of Information must be obtained prior to any record release in order to collaborate their care with an outside provider. Patient/Guardian was advised if they have not already done so to contact the registration department to sign all necessary forms in order for us  to release information regarding their care.   Consent: Patient/Guardian gives verbal consent for treatment and assignment of benefits for services provided during this visit. Patient/Guardian expressed understanding and agreed to proceed.   Diagnosis: Severe episode of recurrent major depressive disorder, without psychotic features (HCC) [F33.2]    1. Severe episode of recurrent major depressive disorder, without psychotic features (HCC)   2. GAD (generalized anxiety disorder)       Randall Bastos, LCSW

## 2024-05-25 ENCOUNTER — Encounter (HOSPITAL_COMMUNITY): Payer: Self-pay | Admitting: Psychiatry

## 2024-05-25 ENCOUNTER — Telehealth (HOSPITAL_COMMUNITY): Admitting: Psychiatry

## 2024-05-25 DIAGNOSIS — F332 Major depressive disorder, recurrent severe without psychotic features: Secondary | ICD-10-CM

## 2024-05-25 DIAGNOSIS — F411 Generalized anxiety disorder: Secondary | ICD-10-CM

## 2024-05-25 MED ORDER — TRAZODONE HCL 50 MG PO TABS: 50.0000 mg | ORAL_TABLET | Freq: Every evening | ORAL | 2 refills | Status: DC | PRN

## 2024-05-25 MED ORDER — HYDROXYZINE HCL 25 MG PO TABS: 25.0000 mg | ORAL_TABLET | Freq: Four times a day (QID) | ORAL | 2 refills | Status: DC | PRN

## 2024-05-25 MED ORDER — CLONAZEPAM 0.5 MG PO TABS: 0.5000 mg | ORAL_TABLET | Freq: Two times a day (BID) | ORAL | 2 refills | Status: DC | PRN

## 2024-05-25 MED ORDER — ESCITALOPRAM OXALATE 20 MG PO TABS
30.0000 mg | ORAL_TABLET | Freq: Every day | ORAL | 2 refills | Status: DC
Start: 2024-05-25 — End: 2024-07-05

## 2024-05-25 MED ORDER — BUPROPION HCL ER (XL) 300 MG PO TB24: 300.0000 mg | ORAL_TABLET | Freq: Every day | ORAL | 2 refills | Status: DC
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
## 2024-06-28 ENCOUNTER — Ambulatory Visit: Payer: Self-pay

## 2024-06-28 NOTE — Telephone Encounter (Signed)
 Aware  Thanks for seeing her

## 2024-06-28 NOTE — Telephone Encounter (Signed)
 FYI Only or Action Required?: FYI only for provider.  Patient was last seen in primary care on 05/02/2024 by Randeen Laine LABOR, MD.  Called Nurse Triage reporting Hand Pain.  Symptoms began yesterday.  Interventions attempted: Nothing.  Symptoms are: gradually worsening.  Triage Disposition: See PCP When Office is Open (Within 3 Days)  Patient/caregiver understands and will follow disposition?: Yes  **Patient scheduled for 8/7**            Copied from CRM #[id number]. Topic: Clinical - Red Word Triage >> Jun 28, 2024  4:25 PM Rosina BIRCH wrote: Reason for RMF:[id number] that she has not experinced before and she has a fracture on her hand. Patient states she has a huge bruise on her right hand at the wrist. She stated it bruised in twenty minutes and she has shooting pain. Patient stated it is a huge lump on her wrist Reason for Disposition  [1] MODERATE pain (e.g., interferes with normal activities) AND [2] present > 3 days  Answer Assessment - Initial Assessment Questions 1. ONSET: When did the pain start?     X 1 day  2. LOCATION: Where is the pain located?      Bruise, hand pain on right wrist, she describes a lump forming as well   3. PAIN: How bad is the pain? (Scale 1-10; or mild, moderate, severe)   - MILD (1-3): doesn't interfere with normal activities   - MODERATE (4-7): interferes with normal activities (e.g., work or school) or awakens from sleep   - SEVERE (8-10): excruciating pain, unable to use hand at all      4/10   4. WORK OR EXERCISE: Has there been any recent work or exercise that involved this part (i.e., hand or wrist) of the body?     No, yesterday from the car to the front door, bruise appeared.   5. CAUSE: What do you think is causing the pain?     Unknown   6. AGGRAVATING FACTORS: What makes the pain worse? (e.g., using computer)     Making a fist   7. OTHER SYMPTOMS: Do you have any other symptoms? (e.g., neck pain, swelling,  rash, numbness, fever)  No    Complaints of Hemorrhoids as well, for the first time. Appt scheduled for 8/7  Protocols used: Hand and Wrist Pain-A-AH

## 2024-06-29 ENCOUNTER — Ambulatory Visit: Admitting: Family

## 2024-07-05 ENCOUNTER — Other Ambulatory Visit (HOSPITAL_COMMUNITY): Payer: Self-pay | Admitting: Psychiatry

## 2024-07-05 ENCOUNTER — Telehealth (HOSPITAL_COMMUNITY): Payer: Self-pay

## 2024-07-05 DIAGNOSIS — F332 Major depressive disorder, recurrent severe without psychotic features: Secondary | ICD-10-CM

## 2024-07-05 MED ORDER — ESCITALOPRAM OXALATE 10 MG PO TABS: 10.0000 mg | ORAL_TABLET | Freq: Every day | ORAL | 2 refills | Status: DC

## 2024-07-05 MED ORDER — ESCITALOPRAM OXALATE 20 MG PO TABS: 20.0000 mg | ORAL_TABLET | Freq: Every day | ORAL | 2 refills | Status: DC

## 2024-07-05 NOTE — Telephone Encounter (Signed)
 Prior authorization for pt's escitalopram  oxalate 20 mg tablets denied due to quantity requested. The requested quantity of 45 tablets per 30 days is more than the health plan's limit. Please advise

## 2024-07-05 NOTE — Telephone Encounter (Signed)
 I'll try sending in the 20s and 10s separately. If this doesn't work we will need to do an appeal

## 2024-07-06 ENCOUNTER — Telehealth (HOSPITAL_COMMUNITY): Admitting: Psychiatry

## 2024-07-10 ENCOUNTER — Encounter (HOSPITAL_COMMUNITY): Payer: Self-pay | Admitting: Psychiatry

## 2024-07-10 ENCOUNTER — Telehealth (INDEPENDENT_AMBULATORY_CARE_PROVIDER_SITE_OTHER): Admitting: Psychiatry

## 2024-07-10 DIAGNOSIS — F411 Generalized anxiety disorder: Secondary | ICD-10-CM | POA: Diagnosis not present

## 2024-07-10 DIAGNOSIS — F332 Major depressive disorder, recurrent severe without psychotic features: Secondary | ICD-10-CM | POA: Diagnosis not present

## 2024-07-10 MED ORDER — HYDROXYZINE HCL 25 MG PO TABS: 25.0000 mg | ORAL_TABLET | Freq: Four times a day (QID) | ORAL | 2 refills | Status: DC | PRN

## 2024-07-10 MED ORDER — ARIPIPRAZOLE 2 MG PO TABS: 2.0000 mg | ORAL_TABLET | Freq: Every day | ORAL | 2 refills | Status: DC

## 2024-07-10 MED ORDER — CLONAZEPAM 0.5 MG PO TABS: 0.5000 mg | ORAL_TABLET | Freq: Three times a day (TID) | ORAL | 2 refills | Status: DC | PRN

## 2024-07-10 MED ORDER — TRAZODONE HCL 50 MG PO TABS: 50.0000 mg | ORAL_TABLET | Freq: Every evening | ORAL | 2 refills | Status: DC | PRN

## 2024-07-10 MED ORDER — BUPROPION HCL ER (XL) 300 MG PO TB24: 300.0000 mg | ORAL_TABLET | Freq: Every day | ORAL | 2 refills | Status: DC
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
## 2024-07-11 ENCOUNTER — Telehealth (HOSPITAL_COMMUNITY): Payer: Self-pay | Admitting: Professional

## 2024-07-11 NOTE — Telephone Encounter (Signed)
 See call log

## 2024-07-12 ENCOUNTER — Telehealth (HOSPITAL_COMMUNITY): Payer: Self-pay | Admitting: Licensed Clinical Social Worker

## 2024-07-12 NOTE — Telephone Encounter (Signed)
 See call log

## 2024-07-19 ENCOUNTER — Telehealth (HOSPITAL_COMMUNITY): Payer: Self-pay | Admitting: Professional

## 2024-07-19 NOTE — Telephone Encounter (Signed)
 See call log

## 2024-07-25 ENCOUNTER — Telehealth (HOSPITAL_COMMUNITY): Payer: Self-pay

## 2024-07-25 NOTE — Telephone Encounter (Signed)
 The risk of the 2 together is not for hypertension, its for serotonin syndrome which is extremely rare. Also, Wellbutrin  mostly increase norepinephrine not serotonin. I'm okay with her taking it but watch for signs of tremor or restlessness. They should still try to contact urologist

## 2024-07-25 NOTE — Telephone Encounter (Signed)
 Medication management - Telephone call with Amy, pharmacist at [facility] to follow up on their concerns for patient to take Wellbutrin  with recently prescribed Uribel. Informed Dr. Okey was okay with this due to less likely with Wellbutrin  but still left patient a message of need to also check with her urologist to make sure they were aware and were okay with this as well.  Informed this nurse left on pt message to contact our office back if any issues with tremors or restlessness.

## 2024-07-25 NOTE — Telephone Encounter (Signed)
 Medication problem - Call from Amy, pharmacist at [facility] regarding patient's Wellbutrin  XL medication being contraindicated for use with Uribel that a Dr. Delon Greet put patient on from [facility]. Pharmacist has been unable to reach this Dr. Greet or their supervisor, Dr. Herb in [place] but is concerned about patient taking Uribel, used for bladder spasms with Wellbutrin  XL that can cause elevated blood pressures.  Agreed to send this information to Dr. Okey to see if any changes warranted since they had been unable to connect with the prescribing provider, even having the wrong address for their practice.

## 2024-07-25 NOTE — Telephone Encounter (Signed)
 Medication management - message left for pt that Dr. Okey was okay with her continuing to take Wellbutrin  and Uribel together due to less chance of Serotonin Syndrome with the Wellbutrin  than other antidepressants but to be aware if starts having tremors or restlessness and to then call our office back. Also, requested, per Dr. Okey for patient to still reach out to her urologist to discuss these medications as well and using together.

## 2024-08-01 ENCOUNTER — Emergency Department

## 2024-08-01 ENCOUNTER — Emergency Department: Admission: EM | Admit: 2024-08-01 | Discharge: 2024-08-01 | Disposition: A | Discharge status: Home / Self Care

## 2024-08-01 ENCOUNTER — Other Ambulatory Visit: Payer: Self-pay

## 2024-08-01 DIAGNOSIS — W010XXA Fall on same level from slipping, tripping and stumbling without subsequent striking against object, initial encounter: Secondary | ICD-10-CM | POA: Insufficient documentation

## 2024-08-01 DIAGNOSIS — S52532A Colles' fracture of left radius, initial encounter for closed fracture: Secondary | ICD-10-CM | POA: Insufficient documentation

## 2024-08-01 DIAGNOSIS — S6992XA Unspecified injury of left wrist, hand and finger(s), initial encounter: Secondary | ICD-10-CM | POA: Diagnosis present

## 2024-08-01 DIAGNOSIS — M79602 Pain in left arm: Secondary | ICD-10-CM

## 2024-08-01 MED ORDER — ONDANSETRON 4 MG PO TBDP
4.0000 mg | ORAL_TABLET | Freq: Once | ORAL | Status: AC
  Administered 2024-08-01: 4 mg via ORAL
  Filled 2024-08-01: qty 1

## 2024-08-01 MED ORDER — OXYCODONE HCL 5 MG PO TABS
5.0000 mg | ORAL_TABLET | Freq: Once | ORAL | Status: AC
  Administered 2024-08-01: 5 mg via ORAL
  Filled 2024-08-01: qty 1

## 2024-08-01 MED ORDER — OXYCODONE-ACETAMINOPHEN 5-325 MG PO TABS: 1.0000 | ORAL_TABLET | ORAL | 0 refills | Status: AC | PRN

## 2024-08-01 MED ORDER — LIDOCAINE HCL 2 % IJ SOLN: 10.0000 mL | Freq: Once | INTRAMUSCULAR | Status: DC

## 2024-08-01 MED ORDER — KETAMINE HCL 10 MG/ML IJ SOLN
2.0000 mg/kg | Freq: Once | INTRAMUSCULAR | Status: AC
  Administered 2024-08-01: 142 mg via INTRAMUSCULAR
  Filled 2024-08-01: qty 1

## 2024-08-01 MED ORDER — MIDAZOLAM HCL (PF) 2 MG/2ML IJ SOLN
5.0000 mg | Freq: Once | INTRAMUSCULAR | Status: AC
  Administered 2024-08-01: 5 mg via NASAL
  Filled 2024-08-01: qty 6

## 2024-08-01 NOTE — ED Notes (Signed)
Repeat xray done

## 2024-08-01 NOTE — ED Notes (Addendum)
 Dr nicholaus and myah pa-c at bedside.   Pt awake and talking.    Siderails up x 2.

## 2024-08-01 NOTE — ED Notes (Signed)
 Ketamine  given with dr davis and myah pa-c at bedside.  Pt tolerated well.

## 2024-08-01 NOTE — Discharge Instructions (Signed)
 You were seen in the emergency department for left arm pain after fall.  Workup today demonstrated a fracture which was reduced.  You need to call and make a follow-up appointment with orthopedics tomorrow.  Please do not drive or operate machinery for the next 24 hours and do not do this if you are on pain medications.  You may have some achy crampy pain but if you notice acutely worsening pain and finger discoloration, there may be a problem with the splint and you need to return to the emergency department.  It was very nice meeting you and I wish you the best of luck with everything. Rolland Moats, MD, PhD -- RETURN PRECAUTIONS & AFTERCARE: (ENGLISH) RETURN PRECAUTIONS: Return immediately to the emergency department or see/call your doctor if you feel worse, weak or have changes in speech or vision, are short of breath, have fever, vomiting, pain, bleeding or dark stool, trouble urinating or any new issues. Return here or see/call your doctor if not improving as expected for your suspected condition. FOLLOW-UP CARE: Call your doctor and/or any doctors we referred you to for more advice and to make an appointment. Do this today, tomorrow or after the weekend. Some doctors only take PPO insurance so if you have HMO insurance you may want to contact your HMO or your regular doctor for referral to a specialist within your plan. Either way tell the doctor's office that it was a referral from the emergency department so you get the soonest possible appointment.  YOUR TEST RESULTS: Take result reports of any blood or urine tests, imaging tests and EKG's to your doctor and any referral doctor. Have any abnormal tests repeated. Your doctor or a referral doctor can let you know when this should be done. Also make sure your doctor contacts this hospital to get any test results that are not currently available such as cultures or special tests for infection and final imaging reports, which are often not available at  the time you leave the ER but which may list additional important findings that are not documented on the preliminary report. BLOOD PRESSURE: If your blood pressure was greater than 120/80 have your blood pressure rechecked within 1 to 2 weeks. MEDICATION SIDE EFFECTS: Do not drive, walk, bike, take the bus, etc. if you have received or are being prescribed any sedating medications such as those for pain or anxiety or certain antihistamines like Benadryl . If you have been give one of these here get a taxi home or have a friend drive you home. Ask your pharmacist to counsel you on potential side effects of any new medication

## 2024-08-01 NOTE — ED Notes (Signed)
 Left wrist reduced by dr nicholaus and myah pa-c.  Pt tolerated well.  Family at bedside.

## 2024-08-01 NOTE — ED Notes (Signed)
 Left hand in finger traps now.  Applied by myah pa-c.  Pt tolerated well.  Pt awake and talking.   Family with pt.

## 2024-08-01 NOTE — ED Notes (Signed)
 Post reduction of left wrist done in room with md and pa-c, rn at bedside.

## 2024-08-01 NOTE — ED Notes (Addendum)
 See triage note  Presents with injury to left wrist  States she slipped and fell at home  Landed on left wrist  Positive deformity noted  good pulses

## 2024-08-01 NOTE — ED Notes (Addendum)
 Paper Consent signed by father for procedure of left wrist.

## 2024-08-01 NOTE — ED Provider Notes (Signed)
 [facility] Provider Note    Event Date/Time   First MD Initiated Contact with Patient 08/01/24 1504     (approximate)   History   Wrist Injury   HPI  Pamela Calhoun is a 38 y.o. female with no significant past medical history who presents with left wrist pain.  Patient was cleaning the house and slipped on some water and fell forward on the left breast.  She instantly had left wrist pain and numbness on her thumb.  She denies hitting her head or any loss of consciousness.  She is not on any blood thinners.  Denies any pain anywhere else.  She is ambulated without lightheadedness but does feel nauseous.  She presents with her father who contributes to history      Physical Exam   Triage Vital Signs: ED Triage Vitals  Encounter Vitals Group     BP 08/01/24 1328 (!) 121/90     Girls Systolic BP Percentile --      Girls Diastolic BP Percentile --      Boys Systolic BP Percentile --      Boys Diastolic BP Percentile --      Pulse Rate 08/01/24 1328 (!) 106     Resp 08/01/24 1328 18     Temp 08/01/24 1328 98.1 F (36.7 C)     Temp Source 08/01/24 1328 Oral     SpO2 08/01/24 1328 97 %     Weight 08/01/24 1330 156 lb (70.8 kg)     Height 08/01/24 1330 5' 10 (1.778 m)     Head Circumference --      Peak Flow --      Pain Score 08/01/24 1329 10     Pain Loc --      Pain Education --      Exclude from Growth Chart --     Most recent vital signs: Vitals:   08/01/24 1730 08/01/24 1746  BP: 111/76   Pulse: 77   Resp:    Temp:  98.1 F (36.7 C)  SpO2: 100%     Nursing Triage Note reviewed. Vital signs reviewed and patients oxygen saturation is normoxic General: Patient is well nourished, well developed, awake and alert, resting comfortably in no acute distress Head: Normocephalic and atraumatic Eyes: Normal inspection, extraocular muscles intact, no conjunctival pallor Ear, nose, throat: Normal external exam Neck: Normal range of  motion Respiratory: Patient is in no respiratory distress, lungs CTAB Cardiovascular: Patient is not tachycardic, RRR without murmur appreciated GI: Abd SNT with no guarding or rebound  Back: Normal inspection of the back with good strength and range of motion throughout all ext Extremities: pulses intact with good cap refills, no LE pitting edema or calf tenderness Obvious deformity of left lateral wrist.  On arrival patient has numbness to the median distribution including her thumb and forefinger but no deficits on the ulnar distribution or radial distribution  Neuro: The patient is alert and oriented to person, place, and time, appropriately conversive, with 5/5 bilat UE/LE strength, no gross motor or sensory defects noted. Coordination appears to be adequate. Skin: Warm, dry, and intact Psych: normal mood and affect, no SI or HI  ED Results / Procedures / Treatments   Labs (all labs ordered are listed, but only abnormal results are displayed) Labs Reviewed - No data to display   EKG   RADIOLOGY X-ray wrist: Fracture and displacement of right distal radius X-ray rest: Improved reduction of distal radius  PROCEDURES:  Critical Care performed: No  .Nerve Block  Date/Time: 08/01/2024 11:13 PM  Performed by: Nicholaus Rolland BRAVO, MD Authorized by: Nicholaus Rolland BRAVO, MD   Consent:    Consent obtained:  Verbal   Consent given by:  Patient and parent   Risks discussed:  Allergic reaction, infection, nerve damage, pain, intravenous injection and bleeding   Alternatives discussed:  Delayed treatment, no treatment and alternative treatment Universal protocol:    Procedure explained and questions answered to patient or proxy's satisfaction: yes     Relevant documents present and verified: yes     Imaging studies available: yes     Site/side marked: yes     Patient identity confirmed:  Verbally with patient and arm band Indications:    Indications:  Pain relief and procedural  anesthesia Location:    Laterality:  Left Pre-procedure details:    Skin preparation:  Alcohol Skin anesthesia:    Skin anesthesia method:  Local infiltration   Local anesthetic:  Lidocaine  1% w/o epi Procedure details:    Block needle gauge:  21 G   Anesthetic injected:  Lidocaine  1% w/o epi   Injection procedure:  Anatomic landmarks identified, anatomic landmarks palpated and negative aspiration for blood Post-procedure details:    Outcome:  Pain improved   Procedure completion:  Tolerated well, no immediate complications Comments:     Patient underwent a left hematoma block .Reduction of fracture  Date/Time: 08/01/2024 11:14 PM  Performed by: Nicholaus Rolland BRAVO, MD Authorized by: Nicholaus Rolland BRAVO, MD  Consent: Verbal consent obtained. Written consent obtained Risks and benefits: risks, benefits and alternatives were discussed Consent given by: patient and parent Patient understanding: patient states understanding of the procedure being performed Procedure consent: procedure consent matches procedure scheduled Site marked: the operative site was marked Imaging studies: imaging studies available Required items: required blood products, implants, devices, and special equipment available Patient identity confirmed: verbally with patient and arm band Time out: Immediately prior to procedure a time out was called to verify the correct patient, procedure, equipment, support staff and site/side marked as required. Patient tolerance: patient tolerated the procedure well with no immediate complications Comments: Patient underwent reduction of left wrist/radius   .Splint Application  Date/Time: 08/01/2024 11:17 PM  Performed by: Nicholaus Rolland BRAVO, MD Authorized by: Nicholaus Rolland BRAVO, MD   Consent:    Consent obtained:  Verbal and written   Consent given by:  Patient and parent   Risks, benefits, and alternatives were discussed: yes     Risks discussed:  Discoloration, numbness and  pain   Alternatives discussed:  No treatment Universal protocol:    Procedure explained and questions answered to patient or proxy's satisfaction: yes     Relevant documents present and verified: yes     Test results available: yes     Patient identity confirmed:  Verbally with patient Pre-procedure details:    Distal neurologic exam:  Normal   Distal perfusion: distal pulses strong   Procedure details:    Location:  Wrist   Wrist location:  L wrist   Splint type:  Sugar tong   Supplies:  Fiberglass and cotton padding Post-procedure details:    Distal neurologic exam:  Numbness   Procedure completion:  Tolerated well, no immediate complications    MEDICATIONS ORDERED IN ED: Medications  oxyCODONE  (Oxy IR/ROXICODONE ) immediate release tablet 5 mg (5 mg Oral Given 08/01/24 1456)  ondansetron  (ZOFRAN -ODT) disintegrating tablet 4 mg (4 mg Oral Given 08/01/24 1455)  ketamine  (KETALAR ) injection 142 mg (142 mg Intramuscular Given 08/01/24 1554)  midazolam  PF (VERSED ) injection 5 mg (5 mg Nasal Given 08/01/24 1526)  oxyCODONE  (Oxy IR/ROXICODONE ) immediate release tablet 5 mg (5 mg Oral Given 08/01/24 1745)     IMPRESSION / MDM / ASSESSMENT AND PLAN / ED COURSE                                Differential diagnosis includes, but is not limited to, radial fracture ulnar fracture, contusion, nerve damage   ED course: Patient presents with obvious deformity of the left wrist.  X-ray confirmed a impacted and displaced left radial fracture.  On my assessment on arrival she did have numbness along the median distribution and given the concern for nerve damage decision made to reduce immediately.  A hematoma block was performed with pain improvement but not complete numbness.  She did receive intranasal Versed  and this was followed by a 2 mg/kg dose of ketamine  and tolerated being in the fingertrap afterwards.  This demonstrated adequate reduction afterwards with approval from orthopedics.  She was  subsequently splinted in a sugar-tong.  On reassessment patient had complete neurological function and good cap refill.  She was placed in a sling and will follow-up as an outpatient.  She was sent with a prescription for Percocet and advised not to drive for the next 48 hours.  All questions answered and patient and father who is at bedside voiced understanding and requested discharge  At time of discharge there is no evidence of acute life, limb, vision, or fertility threat. Patient has stable vital signs, pain is well controlled, patient is ambulatory and p.o. tolerant.  Discharge instructions were completed using the [employer] system. I would refer you to those at this time. All warnings prescriptions follow-up etc. were discussed in detail with the patient. Patient indicates understanding and is agreeable with this plan. All questions answered.  Patient is made aware that they may return to the emergency department for any worsening or new condition or for any other emergency.   Clinical Course as of 08/01/24 2320  Tue Aug 01, 2024  1509 Patient tells me that there is no way that she is pregnant [HD]  1545 Patient received intranasal Versed  and hematoma block but no nurse available to give ketamine  intramuscularly at this time [HD]  1607 Repeat x-ray demonstrates improvement in posterior alignment but still notable impaction.  Will attempt to hang the patient for a period of 20 minutes [HD]  1615 Patient hanging in finger traps, will schedule an x-ray for 445 [HD]  1704 Orthopedics paged [HD]  1709 Case discussed with Dr. Edie he states that patient's reduction is appropriate agrees with sugar-tong with ulnar deflection and slight wrist flexion [HD]  1738 Patient reassessed and now has full sensation along the median nerve distribution.  Reviewed return precautions.  She will follow-up with Dr. Edie in office.  All questions answered and father will be driving the patient home [HD]    Clinical  Course User Index [HD] Nicholaus Rolland BRAVO, MD   -- Risk: 5 This patient has a high risk of morbidity due to further diagnostic testing or treatment. Rationale: This patient's evaluation and management involve a high risk of morbidity due to the potential severity of presenting symptoms, need for diagnostic testing, and/or initiation of treatment that may require close monitoring. The differential includes conditions with potential for significant deterioration or requiring  escalation of care. Treatment decisions in the ED, including medication administration, procedural interventions, or disposition planning, reflect this level of risk. COPA: 5 The patient has the following acute or chronic illness/injury that poses a possible threat to life or bodily function: [X] : The patient has a potentially serious acute condition or an acute exacerbation of a chronic illness requiring urgent evaluation and management in the Emergency Department. The clinical presentation necessitates immediate consideration of life-threatening or function-threatening diagnoses, even if they are ultimately ruled out.   FINAL CLINICAL IMPRESSION(S) / ED DIAGNOSES   Final diagnoses:  Closed Colles' fracture of left radius, initial encounter  Left arm pain     Rx / DC Orders   ED Discharge Orders          Ordered    oxyCODONE -acetaminophen  (PERCOCET) 5-325 MG tablet  Every 4 hours PRN        08/01/24 1740             Note:  This document was prepared using Dragon voice recognition software and may include unintentional dictation errors.   Nicholaus Rolland BRAVO, MD 08/01/24 [phone]

## 2024-08-01 NOTE — ED Notes (Signed)
 Pt remains awake and talking with family in room  pt has left hand in finger traps.  Report off to [facility] paramedic.

## 2024-08-01 NOTE — ED Triage Notes (Addendum)
 Pt arrives via POV with c/o left wrist injury. Pt fell while cleaning at their house on some water and heard a pop when they fell on their left wrist. Pt's left wrist/arm has a visible deformity. Pt endorses feeling like they want to vomit from pain. Pt is A&Ox4 and ambulatory during triage.

## 2024-08-01 NOTE — ED Notes (Signed)
 Pt alert and talking     Family with pt.  Pt has left hand in finger traps.  Xray with pt now

## 2024-08-03 ENCOUNTER — Emergency Department
Admission: EM | Admit: 2024-08-03 | Discharge: 2024-08-03 | Disposition: A | Discharge status: Home / Self Care | Attending: Emergency Medicine | Admitting: Emergency Medicine

## 2024-08-03 ENCOUNTER — Emergency Department

## 2024-08-03 ENCOUNTER — Other Ambulatory Visit: Payer: Self-pay

## 2024-08-03 DIAGNOSIS — S62102D Fracture of unspecified carpal bone, left wrist, subsequent encounter for fracture with routine healing: Secondary | ICD-10-CM

## 2024-08-03 DIAGNOSIS — Z85828 Personal history of other malignant neoplasm of skin: Secondary | ICD-10-CM | POA: Diagnosis not present

## 2024-08-03 DIAGNOSIS — S52572D Other intraarticular fracture of lower end of left radius, subsequent encounter for closed fracture with routine healing: Secondary | ICD-10-CM | POA: Insufficient documentation

## 2024-08-03 DIAGNOSIS — S6992XD Unspecified injury of left wrist, hand and finger(s), subsequent encounter: Secondary | ICD-10-CM | POA: Diagnosis present

## 2024-08-03 DIAGNOSIS — W1839XD Other fall on same level, subsequent encounter: Secondary | ICD-10-CM | POA: Insufficient documentation

## 2024-08-03 MED ORDER — DOCUSATE SODIUM 100 MG PO CAPS: 100.0000 mg | ORAL_CAPSULE | Freq: Two times a day (BID) | ORAL | 0 refills | Status: AC

## 2024-08-03 MED ORDER — ONDANSETRON 4 MG PO TBDP
4.0000 mg | ORAL_TABLET | Freq: Once | ORAL | Status: AC
  Administered 2024-08-03: 4 mg via ORAL
  Filled 2024-08-03: qty 1

## 2024-08-03 MED ORDER — ONDANSETRON 4 MG PO TBDP: 4.0000 mg | ORAL_TABLET | Freq: Four times a day (QID) | ORAL | 0 refills | Status: DC | PRN

## 2024-08-03 MED ORDER — MORPHINE SULFATE (PF) 4 MG/ML IV SOLN
4.0000 mg | Freq: Once | INTRAVENOUS | Status: AC
  Administered 2024-08-03: 4 mg via INTRAMUSCULAR
  Filled 2024-08-03: qty 1

## 2024-08-03 MED ORDER — OXYCODONE HCL 5 MG PO TABS
10.0000 mg | ORAL_TABLET | Freq: Once | ORAL | Status: AC
  Administered 2024-08-03: 10 mg via ORAL
  Filled 2024-08-03: qty 2

## 2024-08-03 MED ORDER — IBUPROFEN 800 MG PO TABS: 800.0000 mg | ORAL_TABLET | Freq: Three times a day (TID) | ORAL | 0 refills | Status: AC | PRN

## 2024-08-03 MED ORDER — OXYCODONE HCL 10 MG PO TABS: 10.0000 mg | ORAL_TABLET | Freq: Three times a day (TID) | ORAL | 0 refills | Status: DC | PRN

## 2024-08-03 NOTE — ED Provider Notes (Signed)
 [facility] Provider Note    Event Date/Time   First MD Initiated Contact with Patient 08/03/24 0036     (approximate)   History   Wrist Pain   HPI  Pamela Calhoun is a 38 y.o. female with history of OCD, depression, endometriosis who presents to the emergency department for worsening pain in the left wrist with numbness, tingling in her fingers.  Was seen in the emergency department yesterday after a fall backwards fracturing her distal radius.  It was reduced and placed in a sugar-tong splint.  She is worried that the splint may be too tight as her pain has increased and she is having numbness, tingling and feels like her fingertips are cold.  He has been taking Percocet 5 mg / 325 mg at home without any relief.  Denies any new injury.   History provided by patient, friend.    Past Medical History:  Diagnosis Date   Allergy    Anxiety    Cancer (HCC)    skin- hystory of melanoma    Depression    Endometriosis    Fracture    right foot   Frequent UTI    History of shingles    Interstitial cystitis    Miscarriage at 8 to [redacted] weeks gestation    December 2023   Obsessive-compulsive disorder    Pap smear, abnormal    cryosx    PID (acute pelvic inflammatory disease)    hx of   PTSD (post-traumatic stress disorder)    Ureteral reflux    as a child     Past Surgical History:  Procedure Laterality Date   DILATION AND CURETTAGE OF UTERUS     EXCISION OF SKIN TAG N/A 02/11/2016   Procedure: EXCISION OF ANAL SKIN TAG AN INTERNAL PAPILLA ;  Surgeon: Bernarda Ned, MD;  Location: [facility];  Service: General;  Laterality: N/A;   LAPAROSCOPIC APPENDECTOMY N/A 04/22/2023   Procedure: APPENDECTOMY LAPAROSCOPIC;  Surgeon: Signe Mitzie LABOR, MD;  Location: [facility] OR;  Service: General;  Laterality: N/A;   LAPAROSCOPY     pelvic    MELANOMA EXCISION     Right leg    TONSILLECTOMY      MEDICATIONS:  Prior to Admission medications    Medication Sig Start Date End Date Taking? Authorizing Provider  albuterol  (VENTOLIN  HFA) 108 (90 Base) MCG/ACT inhaler Inhale 2 puffs into the lungs every 4 (four) hours as needed for wheezing. 11/21/21   Dugal, Tabitha, FNP  ARIPiprazole  (ABILIFY ) 2 MG tablet Take 1 tablet (2 mg total) by mouth daily. 07/10/24   Okey Barnie SAUNDERS, MD  baclofen (LIORESAL) 10 MG tablet Take 5 mg by mouth 3 (three) times daily. 01/11/23   [provider]  buPROPion  (WELLBUTRIN  XL) 300 MG 24 hr tablet Take 1 tablet (300 mg total) by mouth daily. 07/10/24   Okey Barnie SAUNDERS, MD  clonazePAM  (KLONOPIN ) 0.5 MG tablet Take 1 tablet (0.5 mg total) by mouth 3 (three) times daily as needed for anxiety. 07/10/24   Okey Barnie SAUNDERS, MD  ELMIRON 100 MG capsule Take 2 tablets by mouth at bedtime.  02/18/17   [provider]  hydrOXYzine  (ATARAX ) 25 MG tablet Take 1 tablet (25 mg total) by mouth every 6 (six) hours as needed. 07/10/24   Okey Barnie SAUNDERS, MD  oxyCODONE -acetaminophen  (PERCOCET) 5-325 MG tablet Take 1 tablet by mouth every 4 (four) hours as needed for up to 3 days for severe pain (  pain score 7-10). 08/01/24 08/04/24  Nicholaus Rolland BRAVO, MD  traZODone  (DESYREL ) 50 MG tablet Take 1 tablet (50 mg total) by mouth at bedtime as needed for sleep. 07/10/24   Okey Barnie SAUNDERS, MD    Physical Exam   Triage Vital Signs: ED Triage Vitals  Encounter Vitals Group     BP 08/03/24 0027 (!) 143/95     Girls Systolic BP Percentile --      Girls Diastolic BP Percentile --      Boys Systolic BP Percentile --      Boys Diastolic BP Percentile --      Pulse Rate 08/03/24 0027 (!) 118     Resp 08/03/24 0027 18     Temp 08/03/24 0027 98.1 F (36.7 C)     Temp Source 08/03/24 0027 Oral     SpO2 08/03/24 0027 100 %     Weight 08/03/24 0027 158 lb (71.7 kg)     Height 08/03/24 0027 5' 10 (1.778 m)     Head Circumference --      Peak Flow --      Pain Score 08/03/24 0026 10     Pain Loc --      Pain Education --       Exclude from Growth Chart --     Most recent vital signs: Vitals:   08/03/24 0027 08/03/24 0155  BP: (!) 143/95 125/76  Pulse: (!) 118 (!) 101  Resp: 18 20  Temp: 98.1 F (36.7 C)   SpO2: 100% 100%    CONSTITUTIONAL: Alert, responds appropriately to questions.  Appears uncomfortable HEAD: Normocephalic, atraumatic EYES: Conjunctivae clear, pupils appear equal, sclera nonicteric ENT: normal nose; moist mucous membranes NECK: Supple, normal ROM CARD: Regular and tachycardic; S1 and S2 appreciated RESP: Normal chest excursion without splinting or tachypnea; breath sounds clear and equal bilaterally; no wheezes, no rhonchi, no rales, no hypoxia or respiratory distress, speaking full sentences ABD/GI: Non-distended; soft, non-tender, no rebound, no guarding, no peritoneal signs BACK: The back appears normal EXT: Left arm is in a sugar-tong splint and sling.  Fingertips slightly cool to touch but normal capillary refill.  She has significant tenderness with any manipulation of the left lower arm.  Splint was removed and her compartments are soft and she has a 2+ left radial pulse.  She reports immediate improvement in pain once her sugar-tong splint has been opened up and her Ace wrap was removed.  Sensation improving once splint adjusted, Ace wrap loosened. SKIN: Normal color for age and race; warm; no rash on exposed skin NEURO: Moves all extremities equally, normal speech PSYCH: The patient's mood and manner are appropriate.   ED Results / Procedures / Treatments   LABS: (all labs ordered are listed, but only abnormal results are displayed) Labs Reviewed - No data to display   EKG:  EKG Interpretation Date/Time:    Ventricular Rate:    PR Interval:    QRS Duration:    QT Interval:    QTC Calculation:   R Axis:      Text Interpretation:           RADIOLOGY: My personal review and interpretation of imaging: Repeat x-ray shows no change in the comminuted  intra-articular distal radius fracture.  I have personally reviewed all radiology reports.   DG Wrist Complete Left Result Date: 08/03/2024 EXAM: 3 or more VIEW(S) XRAY OF THE LEFT WRIST 08/03/2024 12:55:00 AM COMPARISON: 08/01/2024 CLINICAL HISTORY: Worsening pain, numbness. Pt was seen  here yesterday for a left wrist fracture, pt reports her fingers have began to tingle and turn cold. Pt reports pain has worsened also. FINDINGS: BONES AND JOINTS: Unchanged comminuted intra-articular distal radial fracture with mild dorsal displacement. Cast material obscuring underlying bony detail. SOFT TISSUES: Diffuse soft tissue swelling. IMPRESSION: 1. Unchanged comminuted intra-articular distal radial fracture . Electronically signed by: Norman Gatlin MD 08/03/2024 01:04 AM EDT RP Workstation: [id number]     PROCEDURES:  Critical Care performed: No     Procedures    IMPRESSION / MDM / ASSESSMENT AND PLAN / ED COURSE  I reviewed the triage vital signs and the nursing notes.    Patient here with numbness, tingling, cool fingertips and increasing pain after distal radius fracture.  In a sugar-tong splint.    DIFFERENTIAL DIAGNOSIS (includes but not limited to):   I suspect the patient's splint was on too tightly causing increasing pain, coolness and tingling to her fingertips because as soon as the splint was opened up she immediately felt relief.  Will repeat x-ray to ensure no change in alignment of her fracture.  No sign clinically of compartment syndrome.  No sign of arterial obstruction, DVT.   Patient's presentation is most consistent with acute presentation with potential threat to life or bodily function.   PLAN: Patient feeling markedly improved wants her splint was opened up and her Ace wrap removed.  She was given IM morphine  for pain control.  Neurovascular intact distally.  Repeat x-ray obtained and reviewed/interpreted by myself and the radiologist and shows no change in  alignment.  Compartments on exam are soft.  Will replace splint and reexamine.   MEDICATIONS GIVEN IN ED: Medications  morphine  (PF) 4 MG/ML injection 4 mg (4 mg Intramuscular Given 08/03/24 0058)  ondansetron  (ZOFRAN -ODT) disintegrating tablet 4 mg (4 mg Oral Given 08/03/24 0057)  oxyCODONE  (Oxy IR/ROXICODONE ) immediate release tablet 10 mg (10 mg Oral Given 08/03/24 0158)     ED COURSE: Patient starting to have pain again as soon as the splint is closed and we attempted to wrap it.  We have adjusted it again and given her additional oral pain medication and she states she is feeling better and comfortable with plan for discharge home.  States she is already almost out of the 10 tablets of Percocet 5 mg that she was provided with 2 days ago.  Will provide with new refill of a stronger dose of oxycodone  for pain control.  Discussed supportive care instructions and return precautions.  She is comfortable with this plan.   At this time, I do not feel there is any life-threatening condition present. I reviewed all nursing notes, vitals, pertinent previous records.  All lab and urine results, EKGs, imaging ordered have been independently reviewed and interpreted by myself.  I reviewed all available radiology reports from any imaging ordered this visit.  Based on my assessment, I feel the patient is safe to be discharged home without further emergent workup and can continue workup as an outpatient as needed. Discussed all findings, treatment plan as well as usual and customary return precautions.  They verbalize understanding and are comfortable with this plan.  Outpatient follow-up has been provided as needed.  All questions have been answered.    CONSULTS:  none   OUTSIDE RECORDS REVIEWED: Reviewed last urology note in August 2025.       FINAL CLINICAL IMPRESSION(S) / ED DIAGNOSES   Final diagnoses:  Closed fracture of left wrist with routine healing, subsequent encounter  Rx / DC  Orders   ED Discharge Orders          Ordered    oxyCODONE  10 MG TABS  Every 8 hours PRN        08/03/24 0135    ibuprofen  (ADVIL ) 800 MG tablet  Every 8 hours PRN        08/03/24 0135    ondansetron  (ZOFRAN -ODT) 4 MG disintegrating tablet  Every 6 hours PRN        08/03/24 0135    docusate sodium  (COLACE) 100 MG capsule  2 times daily        08/03/24 0135             Note:  This document was prepared using Dragon voice recognition software and may include unintentional dictation errors.   Marcus Schwandt, Josette SAILOR, DO 08/03/24 [phone]

## 2024-08-03 NOTE — H&P (View-Only) (Signed)
 ORTHOPEDIC SURGERY- CLINIC NOTE  Chief Complaint: Left distal radius fracture  History of Present Illness:  Pamela Calhoun is a 38 y.o. female who presents today for the above complaint.  Patient slipped and fell at home landing on her left outstretched hand resulting in a left wrist injury.  The patient was seen in the emergency department on 9/9 and underwent a closed reduction and splint placement.  Patient re-presented to the emergency department earlier today due to splint discomfort and numbness and tingling at which point the splint was readjusted.  She reports numbness and tingling primarily to her thumb and index finger.  This was relieved after her splint was adjusted.  She is still complaining of some numbness and tingling to these 2 digits but it is improved as compared to before.  I reviewed the ER notes for the purposes of this encounter.   PMHx, PSurgHx, Fam Hx, Soc Hx, Meds, Allergies: Past Medical History:  Diagnosis Date  . Anxiety   . History of cancer     Past Surgical History:  Procedure Laterality Date  . TONSILLECTOMY  2000  . foot surgery  2003   cancer removed  . Surgery on cervix  2004   cancer cells removed  . APPENDECTOMY  04/22/2023   Dr. Signe    Family History  Problem Relation Age of Onset  . High blood pressure (Hypertension) Mother   . Hyperlipidemia (Elevated cholesterol) Mother   . Coronary Artery Disease (Blocked arteries around heart) Mother   . Diabetes Mother     Social History   Socioeconomic History  . Marital status: Single  Tobacco Use  . Smoking status: Former    Types: Cigarettes    Start date: 2017  . Smokeless tobacco: Never  Substance and Sexual Activity  . Alcohol use: Yes  . Drug use: Never   Social Drivers of Health   Food Insecurity: No Food Insecurity (04/22/2023)   Received from [facility]   Hunger Vital Sign   . Within the past 12 months, you worried that your food would run out before you got the money  to buy more.: Never true   . Within the past 12 months, the food you bought just didn't last and you didn't have money to get more.: Never true  Transportation Needs: No Transportation Needs (04/22/2023)   Received from [facility] - Transportation   . Lack of Transportation (Medical): No   . Lack of Transportation (Non-Medical): No     Current Outpatient Medications  Medication Sig Dispense Refill  . albuterol  MDI, PROVENTIL , VENTOLIN , PROAIR , HFA 90 mcg/actuation inhaler Inhale 2 inhalations into the lungs every 4 (four) hours as needed for Wheezing    . amitriptyline (ELAVIL) 25 MG tablet Take 25 mg by mouth at bedtime    . ARIPiprazole  (ABILIFY ) 2 MG tablet Take 2 mg by mouth once daily    . baclofen (LIORESAL) 10 MG tablet Take 10 mg by mouth 2 (two) times daily    . buPROPion  (WELLBUTRIN  XL) 300 MG XL tablet Take 300 mg by mouth once daily    . clonazePAM  (KLONOPIN ) 0.5 MG tablet Take 0.5 mg by mouth 3 (three) times daily as needed for Anxiety    . docusate (COLACE) 100 MG capsule Take 100 mg by mouth 2 (two) times daily    . escitalopram  oxalate (LEXAPRO ) 10 MG tablet Take 10 mg by mouth once daily    . ibuprofen  (MOTRIN ) 800 MG tablet Take  800 mg by mouth every 8 (eight) hours as needed    . ondansetron  (ZOFRAN -ODT) 4 MG disintegrating tablet Take 4 mg by mouth every 6 (six) hours as needed for Nausea or Vomiting    . oxyCODONE  (OXYIR) 10 mg immediate release tablet Take 10 mg by mouth every 8 (eight) hours    . oxyCODONE -acetaminophen  (PERCOCET) 5-325 mg tablet Take 1 tablet by mouth every 4 (four) hours as needed for Pain    . traZODone  (DESYREL ) 50 MG tablet Take 50 mg by mouth once daily    . ALPRAZolam (XANAX) 1 MG tablet TAKE 1 TABLET EVERY 8 HOURS AS NEEDED FOR ANXIETY.    . hydrOXYzine  (ATARAX ) 25 MG tablet     . norethindrone-ethinyl estradiol (JUNEL 1/20) 1-20 mg-mcg tablet     . pentosan polysulfate (ELMIRON) 100 mg capsule      No current  facility-administered medications for this visit.    Allergies  Allergen Reactions  . Cetirizine Diarrhea    REACTION: diarrhea  . Hydrocodone-Acetaminophen  Itching  . Oxybutynin Dizziness    Review of Systems: A 10+ ROS was performed, reviewed, and the pertinent orthopaedic findings are documented in the HPI.  I have reviewed and agree with the ROS captured by the CMA.    Physical Exam: There were no vitals taken for this visit. General/Constitutional: NAD, conversant Eyes: Pupils equal and round, extraocular movements intact ENT: atraumatic external nose and ears, moist mucous membranes Respiratory: non-labored breathing, symmetric chest rise; clear to auscultation bilaterally Cardiovascular: Regular rate and rhythm Skin: normal skin turgor, warm and dry Neurological: cranial nerves grossly intact, sensation grossly intact Psychological:  Appropriate mood and affect; appropriate judgment Musculoskeletal: as detailed below:  Physical Exam: The patient is in a sugar-tong splint to the left upper extremity.  There is subjective decrease sensation to light touch to the thumb and index finger.  However, her sensation improves and numbness subsides when the splint is loosened slightly.  Patient is able to move her digits slightly within the confines of the splint.  The fingers are warm and well-perfused.  There is evidence of ecchymosis and swelling about her hand and base of her digits that is not covered by the splint.  Imaging and Results: Left wrist radiographs taken in the emergency department on 9/9 and 9/11 were personally reviewed.  They demonstrate an intra-articular distal radius fracture with dorsal comminution.  Assessment & Plan: Pamela Calhoun is a 38 y.o. female patient with a left distal radius fracture - The patient would like to proceed with operative intervention - We will get her on the schedule for a left distal radius open reduction internal fixation -- The  risk, benefits, alternatives to surgery were discussed with the patient including but not limited to infection, injury to nearby anatomic structures, union, malunion, tendon injury, hardware failure, pain. - I counseled the patient to keep her extremity elevated and work on range of motion of her digits  Jackquline CANDIE Barrack, MD [facility] [address] [place], [place] [address] Phone: [phone]

## 2024-08-03 NOTE — ED Notes (Signed)
 Re-wraped sugar tong splint.  Pt expresses that it feels better. Pt back in splint.  Pain 8/10

## 2024-08-03 NOTE — Discharge Instructions (Addendum)
 You may alternate over the counter Tylenol  1000 mg every 6 hours as needed for pain, fever and Ibuprofen  800 mg every 6-8 hours as needed for pain, fever.  Please take Ibuprofen  with food.  Do not take more than 4000 mg of Tylenol  (acetaminophen ) in a 24 hour period.   You are being provided a prescription for opiates (also known as narcotics) for pain control.  Opiates can be addictive and should only be used when absolutely necessary for pain control when other alternatives do not work.  We recommend you only use them for the recommended amount of time and only as prescribed.  Please do not take with other sedative medications or alcohol.  Please do not drive, operate machinery, make important decisions while taking opiates.  Please note that these medications can be addictive and have high abuse potential.  Patients can become addicted to narcotics after only taking them for a few days.  Please keep these medications locked away from children, teenagers or any family members with history of substance abuse.  Narcotic pain medicine may also make you constipated.  You may use over-the-counter medications such as MiraLAX, Colace to prevent constipation.  If you become constipated, you may use over-the-counter enemas as needed.  Itching and nausea are also common side effects of narcotic pain medication.  If you develop uncontrolled vomiting or a rash, please stop these medications and seek medical care.  Please follow-up with orthopedics as provided by the previous ED physician.  Please keep your splint on at all times and keep dry.  You may use your sling as needed for comfort.

## 2024-08-03 NOTE — ED Triage Notes (Signed)
 Pt was seen here yesterday for a left wrist fracture, pt reports her fingers have began to tingle and turn cold. Pt reports pain has worsened also.

## 2024-08-07 NOTE — Anesthesia Preprocedure Evaluation (Signed)
 Anesthesia Evaluation  Patient identified by MRN, date of birth, ID band Patient awake    Reviewed: Allergy & Precautions, H&P , NPO status , Patient's Chart, lab work & pertinent test results  Airway Mallampati: III  TM Distance: >3 FB Neck ROM: Full    Dental no notable dental hx.    Pulmonary neg pulmonary ROS, asthma , former smoker   Pulmonary exam normal breath sounds clear to auscultation       Cardiovascular negative cardio ROS Normal cardiovascular exam Rhythm:Regular Rate:Normal     Neuro/Psych  PSYCHIATRIC DISORDERS Anxiety Depression    negative neurological ROS  negative psych ROS   GI/Hepatic negative GI ROS, Neg liver ROS,,,  Endo/Other  negative endocrine ROS    Renal/GU negative Renal ROS  negative genitourinary   Musculoskeletal negative musculoskeletal ROS (+)    Abdominal   Peds negative pediatric ROS (+)  Hematology negative hematology ROS (+)   Anesthesia Other Findings Allergy  Cancer (HCC) Severe  Depression Endometriosis Interstitial cystitis History of shingles  Fracture  Generalized Anxiety disorder with panic attacks PTSD (post-traumatic stress disorder) Obsessive-compulsive disorder Miscarriage at 8 to [redacted] weeks gestation Negative HCG today Discussed nerve block risks/benefits  Reproductive/Obstetrics negative OB ROS                              Anesthesia Physical Anesthesia Plan  ASA: 2  Anesthesia Plan:    Post-op Pain Management:    Induction: Intravenous  PONV Risk Score and Plan:   Airway Management Planned: LMA  Additional Equipment:   Intra-op Plan:   Post-operative Plan: Extubation in OR  Informed Consent: I have reviewed the patients History and Physical, chart, labs and discussed the procedure including the risks, benefits and alternatives for the proposed anesthesia with the patient or authorized representative who has  indicated his/her understanding and acceptance.     Dental Advisory Given  Plan Discussed with: Anesthesiologist, CRNA and Surgeon  Anesthesia Plan Comments: (Patient consented for risks of anesthesia including but not limited to:  - adverse reactions to medications - damage to eyes, teeth, lips or other oral mucosa - nerve damage due to positioning  - sore throat or hoarseness - Damage to heart, brain, nerves, lungs, other parts of body or loss of life  Patient voiced understanding and assent.)         Anesthesia Quick Evaluation

## 2024-08-09 ENCOUNTER — Other Ambulatory Visit: Payer: Self-pay

## 2024-08-09 ENCOUNTER — Ambulatory Visit: Admission: RE | Admit: 2024-08-09 | Discharge: 2024-08-09 | Disposition: A | Discharge status: Home / Self Care

## 2024-08-09 ENCOUNTER — Ambulatory Visit: Payer: Self-pay | Admitting: Anesthesiology

## 2024-08-09 ENCOUNTER — Encounter: Admission: RE | Disposition: A | Discharge status: Home / Self Care | Payer: Self-pay | Source: Home / Self Care

## 2024-08-09 ENCOUNTER — Ambulatory Visit: Payer: Self-pay

## 2024-08-09 DIAGNOSIS — Z87891 Personal history of nicotine dependence: Secondary | ICD-10-CM | POA: Insufficient documentation

## 2024-08-09 DIAGNOSIS — F32A Depression, unspecified: Secondary | ICD-10-CM | POA: Diagnosis not present

## 2024-08-09 DIAGNOSIS — F419 Anxiety disorder, unspecified: Secondary | ICD-10-CM | POA: Insufficient documentation

## 2024-08-09 DIAGNOSIS — Z79899 Other long term (current) drug therapy: Secondary | ICD-10-CM | POA: Insufficient documentation

## 2024-08-09 DIAGNOSIS — W010XXA Fall on same level from slipping, tripping and stumbling without subsequent striking against object, initial encounter: Secondary | ICD-10-CM | POA: Insufficient documentation

## 2024-08-09 DIAGNOSIS — S52572A Other intraarticular fracture of lower end of left radius, initial encounter for closed fracture: Secondary | ICD-10-CM | POA: Diagnosis not present

## 2024-08-09 DIAGNOSIS — J45909 Unspecified asthma, uncomplicated: Secondary | ICD-10-CM | POA: Diagnosis not present

## 2024-08-09 DIAGNOSIS — S52502A Unspecified fracture of the lower end of left radius, initial encounter for closed fracture: Secondary | ICD-10-CM | POA: Diagnosis present

## 2024-08-09 HISTORY — DX: Panic disorder (episodic paroxysmal anxiety): F41.0

## 2024-08-09 HISTORY — DX: Interstitial cystitis (chronic) without hematuria: N30.10

## 2024-08-09 HISTORY — PX: OPEN REDUCTION INTERNAL FIXATION (ORIF) DISTAL RADIAL FRACTURE: SHX5989

## 2024-08-09 HISTORY — DX: Other specified disorders of bladder: N32.89

## 2024-08-09 LAB — POCT PREGNANCY, URINE: Preg Test, Ur: NEGATIVE

## 2024-08-09 SURGERY — OPEN REDUCTION INTERNAL FIXATION (ORIF) DISTAL RADIUS FRACTURE
Anesthesia: Regional | Site: Wrist | Laterality: Left

## 2024-08-09 MED ORDER — FENTANYL CITRATE (PF) 100 MCG/2ML IJ SOLN
100.0000 ug | Freq: Once | INTRAMUSCULAR | Status: AC
  Administered 2024-08-09: 100 ug via INTRAVENOUS

## 2024-08-09 MED ORDER — DEXAMETHASONE SODIUM PHOSPHATE 10 MG/ML IJ SOLN
INTRAMUSCULAR | Status: AC
Start: 2024-08-09 — End: 2024-08-09
  Filled 2024-08-09: qty 1

## 2024-08-09 MED ORDER — ROPIVACAINE HCL 5 MG/ML IJ SOLN
INTRAMUSCULAR | Status: AC
  Filled 2024-08-09: qty 20

## 2024-08-09 MED ORDER — MIDAZOLAM HCL 2 MG/2ML IJ SOLN
INTRAMUSCULAR | Status: AC
  Filled 2024-08-09: qty 2

## 2024-08-09 MED ORDER — VASOPRESSIN 20 UNIT/ML IV SOLN
INTRAVENOUS | Status: AC
  Filled 2024-08-09: qty 1

## 2024-08-09 MED ORDER — LACTATED RINGERS IV SOLN: INTRAVENOUS | Status: DC

## 2024-08-09 MED ORDER — DEXMEDETOMIDINE HCL IN NACL 80 MCG/20ML IV SOLN
INTRAVENOUS | Status: AC
  Filled 2024-08-09: qty 20

## 2024-08-09 MED ORDER — FENTANYL CITRATE (PF) 100 MCG/2ML IJ SOLN
INTRAMUSCULAR | Status: DC | PRN
  Administered 2024-08-09 (×2): 50 ug via INTRAVENOUS

## 2024-08-09 MED ORDER — DEXAMETHASONE SODIUM PHOSPHATE 4 MG/ML IJ SOLN
INTRAMUSCULAR | Status: AC
Start: 2024-08-09 — End: 2024-08-09
  Filled 2024-08-09: qty 1

## 2024-08-09 MED ORDER — LIDOCAINE HCL (PF) 2 % IJ SOLN
INTRAMUSCULAR | Status: AC
Start: 2024-08-09 — End: 2024-08-09
  Filled 2024-08-09: qty 5

## 2024-08-09 MED ORDER — OXYCODONE HCL 5 MG PO TABS: 5.0000 mg | ORAL_TABLET | ORAL | 0 refills | Status: DC | PRN

## 2024-08-09 MED ORDER — BUPIVACAINE HCL (PF) 0.5 % IJ SOLN
INTRAMUSCULAR | Status: DC | PRN
  Administered 2024-08-09: 18 mL via PERINEURAL

## 2024-08-09 MED ORDER — MIDAZOLAM HCL 2 MG/2ML IJ SOLN
2.0000 mg | Freq: Once | INTRAMUSCULAR | Status: AC
  Administered 2024-08-09: 2 mg via INTRAVENOUS

## 2024-08-09 MED ORDER — ONDANSETRON HCL 4 MG/2ML IJ SOLN
INTRAMUSCULAR | Status: DC | PRN
  Administered 2024-08-09: 4 mg via INTRAVENOUS

## 2024-08-09 MED ORDER — DEXAMETHASONE SODIUM PHOSPHATE 4 MG/ML IJ SOLN
INTRAMUSCULAR | Status: DC | PRN
  Administered 2024-08-09: 4 mg via INTRAVENOUS

## 2024-08-09 MED ORDER — MIDAZOLAM HCL 5 MG/5ML IJ SOLN
INTRAMUSCULAR | Status: DC | PRN
  Administered 2024-08-09 (×2): 1 mg via INTRAVENOUS

## 2024-08-09 MED ORDER — CEFAZOLIN SODIUM-DEXTROSE 2-4 GM/100ML-% IV SOLN
2.0000 g | INTRAVENOUS | Status: AC
  Administered 2024-08-09: 2 g via INTRAVENOUS

## 2024-08-09 MED ORDER — LIDOCAINE-EPINEPHRINE 1 %-1:100000 IJ SOLN
INTRAMUSCULAR | Status: AC
  Filled 2024-08-09: qty 1

## 2024-08-09 MED ORDER — LIDOCAINE HCL (CARDIAC) PF 100 MG/5ML IV SOSY
PREFILLED_SYRINGE | INTRAVENOUS | Status: DC | PRN
  Administered 2024-08-09: 100 mg via INTRATRACHEAL

## 2024-08-09 MED ORDER — FENTANYL CITRATE (PF) 100 MCG/2ML IJ SOLN
INTRAMUSCULAR | Status: AC
  Filled 2024-08-09: qty 2

## 2024-08-09 MED ORDER — PROPOFOL 10 MG/ML IV BOLUS
INTRAVENOUS | Status: DC | PRN
  Administered 2024-08-09: 200 mg via INTRAVENOUS

## 2024-08-09 MED ORDER — ONDANSETRON HCL 4 MG/2ML IJ SOLN
INTRAMUSCULAR | Status: AC
  Filled 2024-08-09: qty 2

## 2024-08-09 MED ORDER — LIDOCAINE HCL (PF) 2 % IJ SOLN
INTRAMUSCULAR | Status: AC
  Filled 2024-08-09: qty 2

## 2024-08-09 MED ORDER — LIDOCAINE-EPINEPHRINE 1 %-1:100000 IJ SOLN
INTRAMUSCULAR | Status: DC | PRN
  Administered 2024-08-09: 8 mL via INTRADERMAL

## 2024-08-09 MED ORDER — CEFAZOLIN SODIUM-DEXTROSE 2-3 GM-%(50ML) IV SOLR
INTRAVENOUS | Status: AC
  Filled 2024-08-09: qty 50

## 2024-08-09 MED ORDER — PHENYLEPHRINE HCL (PRESSORS) 10 MG/ML IV SOLN
INTRAVENOUS | Status: DC | PRN
Start: 2024-08-09 — End: 2024-08-09
  Administered 2024-08-09: 80 ug via INTRAVENOUS
  Administered 2024-08-09: 160 ug via INTRAVENOUS
  Administered 2024-08-09: 120 ug via INTRAVENOUS
  Administered 2024-08-09: 160 ug via INTRAVENOUS
  Administered 2024-08-09: 80 ug via INTRAVENOUS
  Administered 2024-08-09: 160 ug via INTRAVENOUS
  Administered 2024-08-09: 40 ug via INTRAVENOUS

## 2024-08-09 MED ORDER — EPHEDRINE 5 MG/ML INJ
INTRAVENOUS | Status: AC
  Filled 2024-08-09: qty 5

## 2024-08-09 MED ORDER — PHENYLEPHRINE 80 MCG/ML (10ML) SYRINGE FOR IV PUSH (FOR BLOOD PRESSURE SUPPORT)
PREFILLED_SYRINGE | INTRAVENOUS | Status: AC
  Filled 2024-08-09: qty 10

## 2024-08-09 MED ORDER — VASOPRESSIN 20 UNIT/ML IV SOLN
INTRAVENOUS | Status: DC | PRN
  Administered 2024-08-09: 2 [IU] via INTRAVENOUS
  Administered 2024-08-09 (×3): 1 [IU] via INTRAVENOUS

## 2024-08-09 MED ORDER — EPHEDRINE SULFATE (PRESSORS) 50 MG/ML IJ SOLN
INTRAMUSCULAR | Status: DC | PRN
Start: 2024-08-09 — End: 2024-08-09
  Administered 2024-08-09 (×3): 5 mg via INTRAVENOUS
  Administered 2024-08-09: 10 mg via INTRAVENOUS

## 2024-08-09 SURGICAL SUPPLY — 28 items
BIT DRILL CANN SURG 1.8X90 (DRILL) IMPLANT
BIT DRILL CANN SURG 2.3X80 (DRILL) IMPLANT
BLADE MINI RND TIP GREEN BEAV (BLADE) IMPLANT
BNDG ELASTIC 4X5.8 VLCR NS LF (GAUZE/BANDAGES/DRESSINGS) ×1 IMPLANT
BNDG ESMARCH 4X12 STRL LF (GAUZE/BANDAGES/DRESSINGS) IMPLANT
CHLORAPREP W/TINT 26 (MISCELLANEOUS) ×1 IMPLANT
COVER LIGHT HANDLE UNIVERSAL (MISCELLANEOUS) ×2 IMPLANT
CUFF TOURN SGL QUICK 18 (TOURNIQUET CUFF) IMPLANT
DRAPE FLUOR MINI C-ARM 54X84 (DRAPES) ×1 IMPLANT
DRAPE U-SHAPE 48X52 POLY STRL (PACKS) ×1 IMPLANT
GAUZE XEROFORM 1X8 LF (GAUZE/BANDAGES/DRESSINGS) ×1 IMPLANT
GLOVE BIOGEL PI IND STRL 8 (GLOVE) ×1 IMPLANT
GLOVE SURG SYN 7.5 PF PI (GLOVE) ×2 IMPLANT
GOWN STRL REUS W/ TWL LRG LVL3 (GOWN DISPOSABLE) ×2 IMPLANT
KWIRE 1.1 TRACER 100 (WIRE) IMPLANT
NS IRRIG 500ML POUR BTL (IV SOLUTION) ×1 IMPLANT
PACK EXTREMITY ARMC (MISCELLANEOUS) ×1 IMPLANT
PEG LOCK THRD TORX 16 (Peg) IMPLANT
PEG LOCK THRD TORX 18 (Peg) IMPLANT
PLATE BEARING NARW LT 3H (Plate) IMPLANT
SCREW 3.2 CORT BONE 11 (Screw) IMPLANT
SCREW CORT 24X2.3XFXANG NS (Screw) IMPLANT
SCREW HEX 3.2X12 (Screw) IMPLANT
SCREW HEX 3.2X14 (Screw) IMPLANT
SCREW LOCK 3.2X12 CORT (Screw) IMPLANT
SCREW LOCK 3.2X12 CORT - SHEX3.0-12 (Screw) ×2 IMPLANT
SPLINT PLASTER CAST XFAST 3X15 (CAST SUPPLIES) IMPLANT
SUT ETHILON 4 0 PS 2 18 (SUTURE) IMPLANT

## 2024-08-09 NOTE — Transfer of Care (Signed)
 Immediate Anesthesia Transfer of Care Note  Patient: Pamela Calhoun  Procedure(s) Performed: OPEN REDUCTION INTERNAL FIXATION (ORIF) DISTAL RADIUS FRACTURE (Left: Wrist)  Patient Location: PACU  Anesthesia Type: No value filed.  Level of Consciousness: awake, alert  and patient cooperative  Airway and Oxygen Therapy: Patient Spontanous Breathing and Patient connected to supplemental oxygen  Post-op Assessment: Post-op Vital signs reviewed, Patient's Cardiovascular Status Stable, Respiratory Function Stable, Patent Airway and No signs of Nausea or vomiting  Post-op Vital Signs: Reviewed and stable  Complications: No notable events documented.

## 2024-08-09 NOTE — Interval H&P Note (Signed)
 History and Physical Interval Note:  08/09/2024 8:10 AM  Pamela Calhoun  has presented today for surgery, with the diagnosis of Closed fracture of distal end of left radius, unspecified fracture morphology, initial encounter S52.502A.  The various methods of treatment have been discussed with the patient and family. After consideration of risks, benefits and other options for treatment, the patient has consented to  Procedure(s): OPEN REDUCTION INTERNAL FIXATION (ORIF) DISTAL RADIUS FRACTURE (Left) as a surgical intervention.  The patient's history has been reviewed, patient examined, no change in status, stable for surgery.  I have reviewed the patient's chart and labs.  Questions were answered to the patient's satisfaction.     Pamela Calhoun

## 2024-08-09 NOTE — Anesthesia Procedure Notes (Signed)
 Procedure Name: LMA Insertion Date/Time: 08/09/2024 9:09 AM  Performed by: Ellsworth Waldschmidt, CRNAPre-anesthesia Checklist: Patient identified, Emergency Drugs available, Suction available, Patient being monitored and Timeout performed Patient Re-evaluated:Patient Re-evaluated prior to induction Oxygen Delivery Method: Circle system utilized Preoxygenation: Pre-oxygenation with 100% oxygen Induction Type: IV induction LMA: LMA inserted LMA Size: 4.0 Number of attempts: 1 Placement Confirmation: positive ETCO2, CO2 detector and breath sounds checked- equal and bilateral Tube secured with: Tape Dental Injury: Teeth and Oropharynx as per pre-operative assessment

## 2024-08-09 NOTE — Op Note (Addendum)
 Date of procedure:  08/09/2024  Surgeon:  Jackquline CANDIE Barrack, MD   Procedure(s) performed:   ORIF left 2-part intra-articular distal radius fracture (CPT [phone] [2-part])  Assistant(s): none   Preoperative diagnosis:   Left 2-part intra-articular distal radius fracture   Postoperative diagnosis:   Left 2-part intra-articular distal radius fracture   Procedure findings:  2-part intra-articular distal radius fracture  Anesthesia:  General   Estimated blood loss:  25 cc   Specimen:  None   Complications:  None   Implants:  TriMed 2.3/3.2 mm volar distal radius plate and associated locking and non-locking screws   Indications: 38 y.o. year old female with a displaced left distal radius fracture.  Surgical treatment was recommended.  We discussed operative treatment and explained the risks, benefits, and alternatives as well as the expected postoperative course.  The patient elected to proceed with surgery.   Procedure in detail:  The patient was met in the pre-op holding area and the left upper extremity was marked and the consent confirmed.  Anesthesia placed a block.  The patient was then taken to the operating room and the hand table was attached to the stretcher.  Weight-based antibiotics were administered.  All bony prominences were well padded.  A non-sterile tourniquet was placed on the operative arm.  The operative extremity was then prepped and draped in the usual sterile fashion.  A timeout was performed confirming the correct patient, site, and procedure.   The extremity was exsanguinated with an Esmarch and the tourniquet inflated to 250 mm Hg.  A longitudinal incision was centered over the FCR tendon.  The FCR sheath was incised and the tendon was retracted ulnarly.  The FCR subsheath was incised and the underlying FPL tendon and muscle was retracted ulnarly.  The pronator quadratus was then elevated off of the volar face of the radius from a radial to ulnar direction, with the  distal extent being the watershed line.  The brachioradialis tendon was identified at the styloid and released in its entirety to assist with the reduction.  The first dorsal compartment tendons were protected during this step.  The fracture was a 2-part intra-articular fracture.  Manual manipulation was used for reduction.  Fluoroscopy was used to confirm acceptable articular reduction, radial height and inclination, and volar tilt.  The plate was then pinned into appropriate position on the volar radius.  Care was taken to avoid prominence of the distal flare of the plate to decrease risk of flexor tendon irritation.  A nonlocking screw was placed into the shaft.  Then locking screws were placed distally.  Fluoroscopy was used to confirm appropriate screw position and lengths.  There was no intra-articular penetration of the screws.  Locking and nonlocking screws were filled proximally to complete the fixation.  The DRUJ was assessed at neutral, full pronation, and full supination and it was stable. The SL ligament was assessed and no gapping was noted. A skyline view was also obtained and no screws were noted to project dorsally.  The wound was irrigated with normal saline.  The tourniquet was deflated and hemostasis was obtained with bipolar electrocautery.  The incision was closed with 4-0 Nylon.  Sterile dressings were applied followed by a volar wrist splint.  The patient was transferred to the PACU in stable condition.  Counts were correct x2.

## 2024-08-09 NOTE — Progress Notes (Signed)
 Assisted [facility] ANMD with left, supraclavicular, ultrasound guided block. Side rails up, monitors on throughout procedure. See vital signs in flow sheet. Tolerated Procedure well.

## 2024-08-09 NOTE — Discharge Instructions (Signed)
 Discharge Instructions After Surgery   Dressings   Do not remove your dressing. Keep it clean and dry until your follow-up visit.   It is okay to have a small spot of blood on your dressing as long as it does not keep getting bigger.   Cover the dressing and keep it dry in the shower or bath. Keep your arm up and out of the water.   Diet   Return to your regular diet as tolerated.   Activity   If your fingers are free, work on making a full fist and straightening out the fingers within the limits of your splint.  Move other joints such as your elbow or shoulder multiple times a day to prevent getting stiff.  Do not try to move joints that are within a splint.  No lifting, carrying, pushing, or pulling with your arm until your follow-up visit. You may use your hand or fingers for simple things like writing, typing, eating, and brushing your teeth.   If you were given a sling, wear it while your hand or arm is numb. You can stop wearing the sling when you have feeling and strength back in your hand or arm. You may also use the sling as needed to elevate, protect, or support your arm until your follow-up visit.   Pain   The first 48 hours after surgery can hurt the most. You should use the different types of pain medicine along with rest and elevation to help keep the pain manageable.   NSAIDS such as ibuprofen  (Advil  or Motrin ) or naproxen (Aleve) can help with pain and swelling.   Acetaminophen  (Tylenol ) can be used with NSAIDs. Do not take more than 3000 mg of Tylenol  in a 24-hour period.   Narcotic pain medicine such as hydrocodone or oxycodone , should be used as prescribed. Stop taking as soon as possible. Take with food to help prevent nausea. Do not drink alcohol or drive while taking narcotics. Be aware that hydrocodone tablets often already contain acetaminophen  (Tylenol ).  Elevating your operative extremity above the level of your heart will help with pain and swelling. You can  rest your arm on several pillows for support while sleeping or resting.   Constipation   Narcotic pain medicines, changes in eating and drinking, and less activity may cause constipation after surgery.   Over-the-counter stool softeners like docusate (Colace), Senna, or Miralax can help. Follow the directions on the bottle.   Stay hydrated and try to eat high fiber foods.   When to call your surgeon   If your dressing gets wet or very dirty.   Fever more than 101 F.   Incision that is very red, swollen, draining pus, or feels hot.   Severe pain, not getting better with pain medicine.   Severe swelling, even with elevation.   If your fingers become cool, cold, or dark in color.   Bleeding that is getting bigger or soaking through the dressing.   Severe nausea and vomiting.   Problems using the bathroom or no bowel movement for 2-3 days.   For questions or concerns, please call [phone].  Jackquline CANDIE Barrack, MD [facility]

## 2024-08-09 NOTE — Anesthesia Procedure Notes (Addendum)
 Anesthesia Regional Block: Supraclavicular block   Pre-Anesthetic Checklist: , timeout performed,  Correct Patient, Correct Site, Correct Laterality,  Correct Procedure, Correct Position, site marked,  Risks and benefits discussed,  Surgical consent,  Pre-op evaluation,  At surgeon's request and post-op pain management  Laterality: Left and Upper  Prep: chloraprep       Needles:  Injection technique: Single-shot  Needle Type: Echogenic Stimulator Needle      Needle Gauge: 21     Additional Needles:   Procedures:, nerve stimulator,,, ultrasound used (permanent image in chart),,     Nerve Stimulator or Paresthesia:  Response: bicep contraction, 0.45 mA  Additional Responses:   Narrative:  Start time: 08/09/2024 8:33 AM End time: 08/09/2024 8:38 AM Injection made incrementally with aspirations every 5 mL.  Performed by: Personally  Anesthesiologist: Ola Donny BROCKS, MD  Additional Notes: Functioning IV was confirmed and monitors applied.  Sterile prep and drape,hand hygiene and sterile gloves were used.Ultrasound guidance: relevant anatomy identified, needle position confirmed, local anesthetic spread visualized around nerve(s)., vascular puncture avoided.  Image printed for medical record.  Negative aspiration and negative test dose prior to incremental administration of local anesthetic. The patient tolerated the procedure well. Vitals signes recorded in RN notes. Also intercostobrachial block left.

## 2024-08-09 NOTE — Anesthesia Postprocedure Evaluation (Signed)
 Anesthesia Post Note  Patient: Pamela Calhoun  Procedure(s) Performed: OPEN REDUCTION INTERNAL FIXATION (ORIF) DISTAL RADIUS FRACTURE (Left: Wrist)  Patient location during evaluation: PACU Anesthesia Type: General Level of consciousness: awake and alert Pain management: pain level controlled Vital Signs Assessment: post-procedure vital signs reviewed and stable Respiratory status: spontaneous breathing, nonlabored ventilation, respiratory function stable and patient connected to nasal cannula oxygen Cardiovascular status: blood pressure returned to baseline and stable Postop Assessment: no apparent nausea or vomiting Anesthetic complications: no Comments: Very comfortable, denies any surgical site pain, happy with her care.    No notable events documented.   Last Vitals:  Vitals:   08/09/24 1056 08/09/24 1112  BP: 131/60 (!) 112/56  Pulse: (!) 117 (!) 104  Resp: (!) 22 (!) 21  Temp: 36.6 C 36.6 C  SpO2: 100% 100%    Last Pain:  Vitals:   08/09/24 1112  TempSrc:   PainSc: 0-No pain                 Octavius Shin C Idali Lafever

## 2024-08-17 ENCOUNTER — Encounter (HOSPITAL_COMMUNITY): Payer: Self-pay | Admitting: Psychiatry

## 2024-08-17 ENCOUNTER — Telehealth (HOSPITAL_COMMUNITY): Admitting: Psychiatry

## 2024-08-17 DIAGNOSIS — F332 Major depressive disorder, recurrent severe without psychotic features: Secondary | ICD-10-CM | POA: Diagnosis not present

## 2024-08-17 MED ORDER — CLONAZEPAM 0.5 MG PO TABS: 0.5000 mg | ORAL_TABLET | Freq: Three times a day (TID) | ORAL | 2 refills | Status: DC | PRN

## 2024-08-17 MED ORDER — ARIPIPRAZOLE 2 MG PO TABS: 2.0000 mg | ORAL_TABLET | Freq: Every day | ORAL | 2 refills | Status: DC

## 2024-08-17 MED ORDER — BUPROPION HCL ER (XL) 300 MG PO TB24: 300.0000 mg | ORAL_TABLET | Freq: Every day | ORAL | 2 refills | Status: DC

## 2024-08-17 MED ORDER — TRAZODONE HCL 50 MG PO TABS: 50.0000 mg | ORAL_TABLET | Freq: Every evening | ORAL | 2 refills | Status: DC | PRN

## 2024-08-17 MED ORDER — HYDROXYZINE HCL 25 MG PO TABS: 25.0000 mg | ORAL_TABLET | Freq: Four times a day (QID) | ORAL | 2 refills | Status: DC | PRN
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
## 2024-08-23 NOTE — Therapy (Unsigned)
 OUTPATIENT OCCUPATIONAL THERAPY ORTHO EVALUATION  Patient Name: Pamela Calhoun MRN: [id number] DOB:December 18, 1985, 38 y.o., female Today's Date: 08/24/2024  PCP: Dr Randeen REFERRING PROVIDER: Dr Ezra  END OF SESSION:  OT End of Session - 08/24/24 1240     Visit Number 1    Number of Visits 18    Date for Recertification  11/02/24    OT Start Time 1032    OT Stop Time 1116    OT Time Calculation (min) 44 min    Activity Tolerance Patient tolerated treatment well    Behavior During Therapy [facility] for tasks assessed/performed          Past Medical History:  Diagnosis Date   Allergy    Anxiety    Bladder spasms    Cancer (HCC)    skin- hystory of melanoma    Chronic interstitial cystitis    Depression    Endometriosis    Fracture    right foot   Frequent UTI    Generalized anxiety disorder with panic attacks    History of shingles    Interstitial cystitis    Miscarriage at 8 to [redacted] weeks gestation    December 2023   Obsessive-compulsive disorder    Pap smear, abnormal    cryosx    PID (acute pelvic inflammatory disease)    hx of   PTSD (post-traumatic stress disorder)    Ureteral reflux    as a child    Past Surgical History:  Procedure Laterality Date   DILATION AND CURETTAGE OF UTERUS     EXCISION OF SKIN TAG N/A 02/11/2016   Procedure: EXCISION OF ANAL SKIN TAG AN INTERNAL PAPILLA ;  Surgeon: Bernarda Ned, MD;  Location: [facility];  Service: General;  Laterality: N/A;   LAPAROSCOPIC APPENDECTOMY N/A 04/22/2023   Procedure: APPENDECTOMY LAPAROSCOPIC;  Surgeon: Signe Mitzie LABOR, MD;  Location: [facility] OR;  Service: General;  Laterality: N/A;   LAPAROSCOPY     pelvic    MELANOMA EXCISION     Right leg    OPEN REDUCTION INTERNAL FIXATION (ORIF) DISTAL RADIAL FRACTURE Left 08/09/2024   Procedure: OPEN REDUCTION INTERNAL FIXATION (ORIF) DISTAL RADIUS FRACTURE;  Surgeon: Ezra Jackquline RAMAN, MD;  Location: [facility];  Service: Orthopedics;   Laterality: Left;   TONSILLECTOMY     Patient Active Problem List   Diagnosis Date Noted   Elevated antinuclear antibody (ANA) level 05/10/2024   Miscarriage at 8 to [redacted] weeks gestation    Low back pain radiating to lower extremity 05/02/2024   Thoracic back pain 05/02/2024   Fatigue 05/02/2024   Myofascial pain 05/02/2024   Joint pain 05/02/2024   MDD (major depressive disorder), recurrent episode, severe (HCC) 04/05/2024   GAD (generalized anxiety disorder) 04/05/2024   Extrinsic asthma 11/21/2021   Otalgia of both ears 07/03/2020   Benign paroxysmal positional vertigo 07/03/2020   History of melanoma 07/25/2019   Atypical squamous cells of undetermined significance on cytologic smear of cervix (ASC-US ) 06/01/2018   Hydradenitis 10/26/2017   Screen for STD (sexually transmitted disease) 01/23/2015   MRSA (methicillin resistant Staphylococcus aureus) carrier 04/12/2014   INTERSTITIAL CYSTITIS 06/25/2010   Allergic rhinitis 03/22/2007   Personal history of other malignant neoplasm of skin 03/22/2007    ONSET DATE: 08/09/24  REFERRING DIAG: L distal radius fx with ORIF  THERAPY DIAG:  Pain in left wrist  Stiffness of left wrist, not elsewhere classified  Muscle weakness (generalized)  Scar tissue  Rationale for Evaluation  and Treatment: Rehabilitation  SUBJECTIVE:   SUBJECTIVE STATEMENT: It really hurts.  This side of my wrist.  I cannot even pull the brace tight Pt accompanied by: self  PERTINENT HISTORY:  ORTHO NOTE 08/22/24 Left Upper Extremity: Incision is clean, dry, intact. No significant erythema, drainage, or other signs of infection. Sutures are in place. Mild ecchymosis about the incision site. Patient is able to almost make a full fist. Patient has very limited wrist flexion and extension. Patient is able to demonstrate almost full pro pronation but has very limited supination. Sensation is intact to light touch throughout. She exhibits some hyperesthesia  during the examination. All digits are warm well-perfused.  Imaging and Results: Radiographs of the left wrist obtained in the clinic today were personally reviewed. The hardware placed remains in appropriate position without evidence of failure, screw backout. The fracture remains appropriately reduced.  Assessment & Plan: RHYLI DEPAULA is a 38 y.o. female patient who is s/p left distal radius fracture ORIF - Sutures removed in clinic today - Transitioned to a removable wrist brace - Continue NWB for another 4 wks - OTC pain medications as needed -Referral to occupational therapy placed for range of motion exercises and desensitization training - Follow-up in 4 weeks   PRECAUTIONS: NWB on L UE 4 wks, prefab wrist brace on      WEIGHT BEARING RESTRICTIONS: NBW 4 wks  PAIN:  Are you having pain? 4/10 pain ulnar wrist - was 8/10 with surgeon attempting supination  FALLS: Has patient fallen in last 6 months? Yes. 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  But since her mom stiff did not do.  [place] and spend time with daughter daughters ages 51, 42 and 81-likes to paint and do crafts  PATIENT GOALS: I want the pain better and get my motion and strength back that I can use my hand  NEXT MD VISIT: 09/21/24  OBJECTIVE:  Note: Objective measures were completed at Evaluation unless otherwise noted.  HAND DOMINANCE: Right  ADLs: Patient is 2 weeks postop still in brace.  Not able to use right hand  FUNCTIONAL OUTCOME MEASURES: PRWHE function 48/50 pain 44/50  UPPER EXTREMITY ROM:     Active ROM Right eval Left eval  Shoulder flexion    Shoulder abduction    Shoulder adduction    Shoulder extension    Shoulder internal rotation    Shoulder external rotation    Elbow flexion    Elbow extension    Wrist flexion  45  Wrist extension  30  Wrist ulnar deviation  18  Wrist radial deviation  5  Wrist  pronation  90  Wrist supination  neutral  (Blank rows = not tested)  Active ROM Right eval Left eval  Thumb MCP (0-60) 60   Thumb IP (0-80) 40   Thumb Radial abd/add (0-55) 35    Thumb Palmar abd/add (0-45) 40    Thumb Opposition to Small Finger Opposition to 5th - pull at thumb    Index MCP (0-90)     Index PIP (0-100)     Index DIP (0-70)      Long MCP (0-90)      Long PIP (0-100)      Long DIP (0-70)      Ring MCP (0-90)      Ring PIP (0-100)      Ring DIP (0-70)  Little MCP (0-90)      Little PIP (0-100)      Little DIP (0-70)      (Blank rows = not tested)   HAND FUNCTION: Grip strength: Right:   lbs; Left:   lbs, Lateral pinch: Right:   lbs, Left:   lbs, and 3 point pinch: Right:   lbs, Left:   lbs  COORDINATION: Decrease because of increase stiffness and immobilization still  SENSATION: Patient reports some numbness in the tip of thumb but improving  EDEMA: Over wrist and dorsal hand  COGNITION: Overall cognitive status: Within functional limits for tasks assessed     TREATMENT DATE: 08/24/24                                                                                                                            Modalities: Contrast bath:  Time: 8 Location: Left wrist and hand Decreased edema and pain and stiffness prior to review of home exercises   Patient fitted with Tubigrip D for light compression. Pain decreased to 1/10 after contrast with range of motion Reviewed with patient 3 times a day to do contrast with Tubigrip D on if needed 10 reps tendon glides Soft tissue mobilization in the thumb webspace prior to active range of motion palmar radial abduction Opposition to all digits Gentle wrist flexion and extension loose fist Palms together active range of motion radial ulnar deviation pain-free And gentle supination focusing on forearm with loose fist or open hand with pain less than 2/10        PATIENT EDUCATION: Education  details: findings of eval and HEP  Person educated: Patient Education method: Explanation, Demonstration, Tactile cues, Verbal cues, and Handouts Education comprehension: verbalized understanding, returned demonstration, verbal cues required, and needs further education   GOALS: Goals reviewed with patient? Yes  LONG TERM GOALS: Target date: 10 wks  Patient to be independent in home program to decrease  pain and hypersensitivity in left hand and pain score for PRWHE improved with more than 20 points Baseline: Patient arrived with 4/10 pain at rest in brace.  Unable to tolerate wrist brace fastened appropriately to support wrist Goal status: INITIAL  2.  Patient to be independent in home program to improve thumb active range of motion in all planes within normal limits symptom-free Baseline: 4/10 pain arriving at rest.  Limited palmar radial abduction 35 to 40 degrees discomfort with opposition to fifth. Goal status: INITIAL  3.  Patient to be independent in home program to increase wrist active range of motion for flexion, extension and radial ulnar deviation to within functional limits symptom-free to use hand 50% in bathing and dressing Baseline: Wrist flexion 45 extension 30 degrees with ulnar deviation 18 and radial deviation 5 pain at rest coming in 4/10 and no knowledge of home program Goal status: INITIAL  4.  Left supination improved to within functional limits symptom-free for patient be able to turn doorknob Baseline: Supination to  neutral.  Patient arrived with pain 4/10 at rest Goal status: INITIAL  5.  Left wrist strength improved to within functional limits for PRWHE function score to improve more than 20 pounds Baseline: PRWHE function score 48/50.  Limited in wrist motion in all planes with pain at rest coming in 4/10; 2 weeks postop Goal status: INITIAL  6.  Left grip and prehension strength improved to within normal range for age to be able to carry groceries and cut  food and return to doing crafts Baseline: 2 weeks postop Goal status: INITIAL   ASSESSMENT:  CLINICAL IMPRESSION: Patient seen today for occupational therapy evaluation for left wrist distal radius fracture with ORIF by Dr. Ezra on 08/09/2024.  Patient arrive with wrist brace loosely on.  Reporting 4/10 pain at rest.  With some hypersensitivity to light and rougher textures.  Numbness improving in the thumb IP.  Patient responded great on home program and decreasing pain at rest to less than 1/10 and able to perform home program for active range of motion.  Patient limited in active range of motion of digits as well as wrist in all planes with increased edema and pain and decrease strength.  Patient can benefit from skilled OT services to decrease edema and pain and increase motion and strength to return to prior level of function.  PERFORMANCE DEFICITS: in functional skills including ADLs, IADLs, ROM, strength, pain, flexibility, decreased knowledge of use of DME, and UE functional use,   and psychosocial skills including environmental adaptation and routines and behaviors.   IMPAIRMENTS: are limiting patient from ADLs, IADLs, rest and sleep, play, leisure, and social participation.   COMORBIDITIES: has no other co-morbidities that affects occupational performance. Patient will benefit from skilled OT to address above impairments and improve overall function.  MODIFICATION OR ASSISTANCE TO COMPLETE EVALUATION: No modification of tasks or assist necessary to complete an evaluation.  OT OCCUPATIONAL PROFILE AND HISTORY: Problem focused assessment: Including review of records relating to presenting problem.  CLINICAL DECISION MAKING: LOW - limited treatment options, no task modification necessary  REHAB POTENTIAL: Good for goals  EVALUATION COMPLEXITY: Low      PLAN:  OT FREQUENCY: 1-2x/week  OT DURATION: 10 weeks  PLANNED INTERVENTIONS: 97168 OT Re-evaluation, 97535 self care/ADL  training, 02889 therapeutic exercise, 97530 therapeutic activity, 97112 neuromuscular re-education, 97140 manual therapy, 97018 paraffin, 02960 fluidotherapy, 97034 contrast bath, 97760 Orthotic Initial, [id number] Orthotic/Prosthetic subsequent, scar mobilization, passive range of motion, patient/family education, and DME and/or AE instructions    CONSULTED AND AGREED WITH PLAN OF CARE: Patient     Ancel Peters, OTR/L,CLT 08/24/2024, 12:43 PM

## 2024-08-24 ENCOUNTER — Encounter: Payer: Self-pay | Admitting: Occupational Therapy

## 2024-08-24 ENCOUNTER — Ambulatory Visit: Admitting: Occupational Therapy

## 2024-08-24 DIAGNOSIS — L905 Scar conditions and fibrosis of skin: Secondary | ICD-10-CM | POA: Diagnosis present

## 2024-08-24 DIAGNOSIS — M25532 Pain in left wrist: Secondary | ICD-10-CM | POA: Diagnosis present

## 2024-08-24 DIAGNOSIS — M25632 Stiffness of left wrist, not elsewhere classified: Secondary | ICD-10-CM | POA: Diagnosis present

## 2024-08-24 DIAGNOSIS — M6281 Muscle weakness (generalized): Secondary | ICD-10-CM | POA: Insufficient documentation

## 2024-08-29 ENCOUNTER — Ambulatory Visit: Admitting: Occupational Therapy

## 2024-08-29 DIAGNOSIS — L905 Scar conditions and fibrosis of skin: Secondary | ICD-10-CM

## 2024-08-29 DIAGNOSIS — M25532 Pain in left wrist: Secondary | ICD-10-CM | POA: Diagnosis not present

## 2024-08-29 DIAGNOSIS — M6281 Muscle weakness (generalized): Secondary | ICD-10-CM

## 2024-08-29 DIAGNOSIS — M25632 Stiffness of left wrist, not elsewhere classified: Secondary | ICD-10-CM

## 2024-08-29 NOTE — Therapy (Signed)
 OUTPATIENT OCCUPATIONAL THERAPY ORTHO TREATMENT  Patient Name: Pamela Calhoun MRN: [id number] DOB:May 19, 1986, 38 y.o., female Today's Date: 08/29/2024  PCP: Dr Randeen REFERRING PROVIDER: Dr Ezra  END OF SESSION:  OT End of Session - 08/29/24 0820     Visit Number 2    Number of Visits 18    Date for Recertification  11/02/24    OT Start Time 0820    Activity Tolerance Patient tolerated treatment well    Behavior During Therapy [facility] for tasks assessed/performed          Past Medical History:  Diagnosis Date   Allergy    Anxiety    Bladder spasms    Cancer (HCC)    skin- hystory of melanoma    Chronic interstitial cystitis    Depression    Endometriosis    Fracture    right foot   Frequent UTI    Generalized anxiety disorder with panic attacks    History of shingles    Interstitial cystitis    Miscarriage at 8 to [redacted] weeks gestation    December 2023   Obsessive-compulsive disorder    Pap smear, abnormal    cryosx    PID (acute pelvic inflammatory disease)    hx of   PTSD (post-traumatic stress disorder)    Ureteral reflux    as a child    Past Surgical History:  Procedure Laterality Date   DILATION AND CURETTAGE OF UTERUS     EXCISION OF SKIN TAG N/A 02/11/2016   Procedure: EXCISION OF ANAL SKIN TAG AN INTERNAL PAPILLA ;  Surgeon: Bernarda Ned, MD;  Location: [facility];  Service: General;  Laterality: N/A;   LAPAROSCOPIC APPENDECTOMY N/A 04/22/2023   Procedure: APPENDECTOMY LAPAROSCOPIC;  Surgeon: Signe Mitzie LABOR, MD;  Location: [facility];  Service: General;  Laterality: N/A;   LAPAROSCOPY     pelvic    MELANOMA EXCISION     Right leg    OPEN REDUCTION INTERNAL FIXATION (ORIF) DISTAL RADIAL FRACTURE Left 08/09/2024   Procedure: OPEN REDUCTION INTERNAL FIXATION (ORIF) DISTAL RADIUS FRACTURE;  Surgeon: Ezra Jackquline RAMAN, MD;  Location: [facility];  Service: Orthopedics;  Laterality: Left;   TONSILLECTOMY     Patient Active Problem  List   Diagnosis Date Noted   Elevated antinuclear antibody (ANA) level 05/10/2024   Miscarriage at 8 to [redacted] weeks gestation    Low back pain radiating to lower extremity 05/02/2024   Thoracic back pain 05/02/2024   Fatigue 05/02/2024   Myofascial pain 05/02/2024   Joint pain 05/02/2024   MDD (major depressive disorder), recurrent episode, severe (HCC) 04/05/2024   GAD (generalized anxiety disorder) 04/05/2024   Extrinsic asthma 11/21/2021   Otalgia of both ears 07/03/2020   Benign paroxysmal positional vertigo 07/03/2020   History of melanoma 07/25/2019   Atypical squamous cells of undetermined significance on cytologic smear of cervix (ASC-US ) 06/01/2018   Hydradenitis 10/26/2017   Screen for STD (sexually transmitted disease) 01/23/2015   MRSA (methicillin resistant Staphylococcus aureus) carrier 04/12/2014   INTERSTITIAL CYSTITIS 06/25/2010   Allergic rhinitis 03/22/2007   Personal history of other malignant neoplasm of skin 03/22/2007    ONSET DATE: 08/09/24  REFERRING DIAG: L distal radius fx with ORIF  THERAPY DIAG:  Pain in left wrist  Stiffness of left wrist, not elsewhere classified  Muscle weakness (generalized)  Scar tissue  Rationale for Evaluation and Treatment: Rehabilitation  SUBJECTIVE:   SUBJECTIVE STATEMENT: I am doing so much better.  The pain is better.  Unable to do my exercises. Pt accompanied by: self  PERTINENT HISTORY:  ORTHO NOTE 08/22/24 Left Upper Extremity: Incision is clean, dry, intact. No significant erythema, drainage, or other signs of infection. Sutures are in place. Mild ecchymosis about the incision site. Patient is able to almost make a full fist. Patient has very limited wrist flexion and extension. Patient is able to demonstrate almost full pro pronation but has very limited supination. Sensation is intact to light touch throughout. She exhibits some hyperesthesia during the examination. All digits are warm well-perfused.  Imaging  and Results: Radiographs of the left wrist obtained in the clinic today were personally reviewed. The hardware placed remains in appropriate position without evidence of failure, screw backout. The fracture remains appropriately reduced.  Assessment & Plan: Pamela Calhoun is a 38 y.o. female patient who is s/p left distal radius fracture ORIF - Sutures removed in clinic today - Transitioned to a removable wrist brace - Continue NWB for another 4 wks - OTC pain medications as needed -Referral to occupational therapy placed for range of motion exercises and desensitization training - Follow-up in 4 weeks   PRECAUTIONS: NWB on L UE 4 wks, prefab wrist brace on      WEIGHT BEARING RESTRICTIONS: NBW 4 wks  PAIN:  Are you having pain?  3/10 at the ulnar wrist with supination  FALLS: Has patient fallen in last 6 months? Yes. 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  But since her mom stiff did not do.  [place] and spend time with daughter daughters ages 30, 47 and 21-likes to paint and do crafts  PATIENT GOALS: I want the pain better and get my motion and strength back that I can use my hand  NEXT MD VISIT: 09/21/24  OBJECTIVE:  Note: Objective measures were completed at Evaluation unless otherwise noted.  HAND DOMINANCE: Right  ADLs: Patient is 2 weeks postop still in brace.  Not able to use right hand  FUNCTIONAL OUTCOME MEASURES: PRWHE function 48/50 pain 44/50  UPPER EXTREMITY ROM:     Active ROM Right eval Left eval L 08/29/24  Shoulder flexion     Shoulder abduction     Shoulder adduction     Shoulder extension     Shoulder internal rotation     Shoulder external rotation     Elbow flexion     Elbow extension     Wrist flexion  45 52  Wrist extension  30 40  Wrist ulnar deviation  18 25  Wrist radial deviation  5 12  Wrist pronation  90 90  Wrist supination  neutral 40 3/10 pain  (Blank rows  = not tested)  Active ROM Right eval Left eval L   Thumb MCP (0-60) 60    Thumb IP (0-80) 40    Thumb Radial abd/add (0-55) 35   40  Thumb Palmar abd/add (0-45) 40   45  Thumb Opposition to Small Finger Opposition to 5th - pull at thumb   Base of 5th   Index MCP (0-90)      Index PIP (0-100)      Index DIP (0-70)       Long MCP (0-90)       Long PIP (0-100)       Long DIP (0-70)       Ring MCP (0-90)  Ring PIP (0-100)       Ring DIP (0-70)       Little MCP (0-90)       Little PIP (0-100)       Little DIP (0-70)       (Blank rows = not tested)   HAND FUNCTION: Grip strength: Right:   lbs; Left:   lbs, Lateral pinch: Right:   lbs, Left:   lbs, and 3 point pinch: Right:   lbs, Left:   lbs  COORDINATION: Decrease because of increase stiffness and immobilization still  SENSATION: Patient reports some numbness in the tip of thumb but improving  EDEMA: Over wrist and dorsal hand  COGNITION: Overall cognitive status: Within functional limits for tasks assessed     TREATMENT DATE: 08/29/24                                                                                                                           Patient arrived with great progress in range of motion for left wrist and digits as well as pain. Patient able to tolerate different textures and massage. Steri-Strips was removed and patient educated on initiating scar mobilization and massage in 2 days.  As well as Cica -Care scar pad for nighttime. Handout provided and reviewed with patient  Patient to continue with contrast at home Modalities: Heat Time: 6 Location: Left forearm facilitating supination.  Reposition several times wrist from pronation to supination during facilitate increased supination with less pain. Patient can do the same at home using her heat to facilitate increased supination   Patient fitted with Tubigrip D for light compression. Reviewed with patient 3 times a day to do contrast  with Tubigrip D on if needed 10 reps tendon glides Soft tissue mobilization in the thumb webspace prior to active range of motion palmar radial abduction-patient can perform active assisted range of motion for endrange Opposition to all digits and sliding down the fifth Gentle wrist flexion and extension loose fist Palms together active range of motion radial ulnar deviation pain-free And gentle supination focusing on forearm with loose fist or open hand with pain less than 2/10-use or heat to facilitate supination        PATIENT EDUCATION: Education details: findings of eval and HEP  Person educated: Patient Education method: Explanation, Demonstration, Tactile cues, Verbal cues, and Handouts Education comprehension: verbalized understanding, returned demonstration, verbal cues required, and needs further education   GOALS: Goals reviewed with patient? Yes  LONG TERM GOALS: Target date: 10 wks  Patient to be independent in home program to decrease  pain and hypersensitivity in left hand and pain score for PRWHE improved with more than 20 points Baseline: Patient arrived with 4/10 pain at rest in brace.  Unable to tolerate wrist brace fastened appropriately to support wrist Goal status: INITIAL  2.  Patient to be independent in home program to improve thumb active range of motion in all planes within normal limits symptom-free Baseline:  4/10 pain arriving at rest.  Limited palmar radial abduction 35 to 40 degrees discomfort with opposition to fifth. Goal status: INITIAL  3.  Patient to be independent in home program to increase wrist active range of motion for flexion, extension and radial ulnar deviation to within functional limits symptom-free to use hand 50% in bathing and dressing Baseline: Wrist flexion 45 extension 30 degrees with ulnar deviation 18 and radial deviation 5 pain at rest coming in 4/10 and no knowledge of home program Goal status: INITIAL  4.  Left supination  improved to within functional limits symptom-free for patient be able to turn doorknob Baseline: Supination to neutral.  Patient arrived with pain 4/10 at rest Goal status: INITIAL  5.  Left wrist strength improved to within functional limits for PRWHE function score to improve more than 20 pounds Baseline: PRWHE function score 48/50.  Limited in wrist motion in all planes with pain at rest coming in 4/10; 2 weeks postop Goal status: INITIAL  6.  Left grip and prehension strength improved to within normal range for age to be able to carry groceries and cut food and return to doing crafts Baseline: 2 weeks postop Goal status: INITIAL   ASSESSMENT:  CLINICAL IMPRESSION: Patient seen today for occupational therapy evaluation for left wrist distal radius fracture with ORIF by Dr. Ezra on 08/09/2024.  Patient arrive with wrist brace loosely on.  Reporting 4/10 pain at rest.  With some hypersensitivity to light and rougher textures.  Numbness improving in the thumb IP.  Patient responded great on home program and decreasing pain at rest to less than 1/10 and able to perform home program for active range of motion.  NOW patient arrived with great improvement in pain as well as active range of motion for left wrist in all planes as well as digits.  Patient Steri-Strips was removed and educated on scar mobilization and scar massage to be initiated in 24 to 48 hours.  As well as C[employer] -Care scar pad at nighttime.  Patient continue with same home program added active assisted range with thumb palmar radial abduction and heat to facilitate supination.  Patient limited in active range of motion of digits as well as wrist in all planes with increased edema and pain and decrease strength.  Patient can benefit from skilled OT services to decrease edema and pain and increase motion and strength to return to prior level of function.  PERFORMANCE DEFICITS: in functional skills including ADLs, IADLs, ROM, strength,  pain, flexibility, decreased knowledge of use of DME, and UE functional use,   and psychosocial skills including environmental adaptation and routines and behaviors.   IMPAIRMENTS: are limiting patient from ADLs, IADLs, rest and sleep, play, leisure, and social participation.   COMORBIDITIES: has no other co-morbidities that affects occupational performance. Patient will benefit from skilled OT to address above impairments and improve overall function.  MODIFICATION OR ASSISTANCE TO COMPLETE EVALUATION: No modification of tasks or assist necessary to complete an evaluation.  OT OCCUPATIONAL PROFILE AND HISTORY: Problem focused assessment: Including review of records relating to presenting problem.  CLINICAL DECISION MAKING: LOW - limited treatment options, no task modification necessary  REHAB POTENTIAL: Good for goals  EVALUATION COMPLEXITY: Low      PLAN:  OT FREQUENCY: 1-2x/week  OT DURATION: 10 weeks  PLANNED INTERVENTIONS: 97168 OT Re-evaluation, 97535 self care/ADL training, 02889 therapeutic exercise, 97530 therapeutic activity, 97112 neuromuscular re-education, 97140 manual therapy, 97018 paraffin, 02960 fluidotherapy, 97034 contrast bath, 97760 Orthotic Initial,  02236 Orthotic/Prosthetic subsequent, scar mobilization, passive range of motion, patient/family education, and DME and/or AE instructions    CONSULTED AND AGREED WITH PLAN OF CARE: Patient     Ancel Peters, OTR/L,CLT 08/29/2024, 8:21 AM

## 2024-09-04 ENCOUNTER — Ambulatory Visit: Admitting: Occupational Therapy

## 2024-09-04 ENCOUNTER — Encounter: Payer: Self-pay | Admitting: Occupational Therapy

## 2024-09-04 DIAGNOSIS — M25532 Pain in left wrist: Secondary | ICD-10-CM | POA: Diagnosis not present

## 2024-09-04 DIAGNOSIS — L905 Scar conditions and fibrosis of skin: Secondary | ICD-10-CM

## 2024-09-04 DIAGNOSIS — M25632 Stiffness of left wrist, not elsewhere classified: Secondary | ICD-10-CM

## 2024-09-04 NOTE — Therapy (Signed)
 OUTPATIENT OCCUPATIONAL THERAPY ORTHO TREATMENT  Patient Name: Pamela Calhoun MRN: [id number] DOB:03/25/1986, 38 y.o., female Today's Date: 09/04/2024  PCP: Dr Randeen REFERRING PROVIDER: Dr Ezra  END OF SESSION:  OT End of Session - 09/04/24 1605     Visit Number 3    Number of Visits 18    Date for Recertification  11/02/24    OT Start Time 1600    OT Stop Time 1645    OT Time Calculation (min) 45 min    Activity Tolerance Patient tolerated treatment well    Behavior During Therapy [facility] for tasks assessed/performed           Past Medical History:  Diagnosis Date   Allergy    Anxiety    Bladder spasms    Cancer (HCC)    skin- hystory of melanoma    Chronic interstitial cystitis    Depression    Endometriosis    Fracture    right foot   Frequent UTI    Generalized anxiety disorder with panic attacks    History of shingles    Interstitial cystitis    Miscarriage at 8 to [redacted] weeks gestation    December 2023   Obsessive-compulsive disorder    Pap smear, abnormal    cryosx    PID (acute pelvic inflammatory disease)    hx of   PTSD (post-traumatic stress disorder)    Ureteral reflux    as a child    Past Surgical History:  Procedure Laterality Date   DILATION AND CURETTAGE OF UTERUS     EXCISION OF SKIN TAG N/A 02/11/2016   Procedure: EXCISION OF ANAL SKIN TAG AN INTERNAL PAPILLA ;  Surgeon: Bernarda Ned, MD;  Location: [facility];  Service: General;  Laterality: N/A;   LAPAROSCOPIC APPENDECTOMY N/A 04/22/2023   Procedure: APPENDECTOMY LAPAROSCOPIC;  Surgeon: Signe Mitzie LABOR, MD;  Location: [facility] OR;  Service: General;  Laterality: N/A;   LAPAROSCOPY     pelvic    MELANOMA EXCISION     Right leg    OPEN REDUCTION INTERNAL FIXATION (ORIF) DISTAL RADIAL FRACTURE Left 08/09/2024   Procedure: OPEN REDUCTION INTERNAL FIXATION (ORIF) DISTAL RADIUS FRACTURE;  Surgeon: Ezra Jackquline RAMAN, MD;  Location: [facility];  Service: Orthopedics;   Laterality: Left;   TONSILLECTOMY     Patient Active Problem List   Diagnosis Date Noted   Elevated antinuclear antibody (ANA) level 05/10/2024   Miscarriage at 8 to [redacted] weeks gestation    Low back pain radiating to lower extremity 05/02/2024   Thoracic back pain 05/02/2024   Fatigue 05/02/2024   Myofascial pain 05/02/2024   Joint pain 05/02/2024   MDD (major depressive disorder), recurrent episode, severe (HCC) 04/05/2024   GAD (generalized anxiety disorder) 04/05/2024   Extrinsic asthma 11/21/2021   Otalgia of both ears 07/03/2020   Benign paroxysmal positional vertigo 07/03/2020   History of melanoma 07/25/2019   Atypical squamous cells of undetermined significance on cytologic smear of cervix (ASC-US ) 06/01/2018   Hydradenitis 10/26/2017   Screen for STD (sexually transmitted disease) 01/23/2015   MRSA (methicillin resistant Staphylococcus aureus) carrier 04/12/2014   INTERSTITIAL CYSTITIS 06/25/2010   Allergic rhinitis 03/22/2007   Personal history of other malignant neoplasm of skin 03/22/2007    ONSET DATE: 08/09/24  REFERRING DIAG: L distal radius fx with ORIF  THERAPY DIAG:  Stiffness of left wrist, not elsewhere classified  Pain in left wrist  Scar tissue  Rationale for Evaluation and Treatment: Rehabilitation  SUBJECTIVE:   SUBJECTIVE STATEMENT: Reports pain 5-7/10 at night, using heat and OTC medication for pain mgmt  Pt accompanied by: self  PERTINENT HISTORY:  ORTHO NOTE 08/22/24 Left Upper Extremity: Incision is clean, dry, intact. No significant erythema, drainage, or other signs of infection. Sutures are in place. Mild ecchymosis about the incision site. Patient is able to almost make a full fist. Patient has very limited wrist flexion and extension. Patient is able to demonstrate almost full pro pronation but has very limited supination. Sensation is intact to light touch throughout. She exhibits some hyperesthesia during the examination. All digits are  warm well-perfused.  Imaging and Results: Radiographs of the left wrist obtained in the clinic today were personally reviewed. The hardware placed remains in appropriate position without evidence of failure, screw backout. The fracture remains appropriately reduced.  Assessment & Plan: Pamela Calhoun is a 38 y.o. female patient who is s/p left distal radius fracture ORIF - Sutures removed in clinic today - Transitioned to a removable wrist brace - Continue NWB for another 4 wks - OTC pain medications as needed -Referral to occupational therapy placed for range of motion exercises and desensitization training - Follow-up in 4 weeks   PRECAUTIONS: NWB on L UE 4 wks, prefab wrist brace on      WEIGHT BEARING RESTRICTIONS: NBW 4 wks  PAIN:  Are you having pain?  3/10 at the ulnar wrist with supination  FALLS: Has patient fallen in last 6 months? Yes. 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  [place] and spend time with daughter daughters ages 33, 82 and 23-likes to paint and do crafts  PATIENT GOALS: I want the pain better and get my motion and strength back that I can use my hand  NEXT MD VISIT: 09/21/24  OBJECTIVE:  Note: Objective measures were completed at Evaluation unless otherwise noted.  HAND DOMINANCE: Right  ADLs: Patient is 2 weeks postop still in brace.  Not able to use right hand  FUNCTIONAL OUTCOME MEASURES: PRWHE function 48/50 pain 44/50  UPPER EXTREMITY ROM:     Active ROM Right eval Left eval L 08/29/24  Shoulder flexion     Shoulder abduction     Shoulder adduction     Shoulder extension     Shoulder internal rotation     Shoulder external rotation     Elbow flexion     Elbow extension     Wrist flexion  45 52  Wrist extension  30 40  Wrist ulnar deviation  18 25  Wrist radial deviation  5 12  Wrist pronation  90 90  Wrist supination  neutral 40 3/10 pain  (Blank rows = not  tested)  Active ROM Right eval Left eval L   Thumb MCP (0-60) 60    Thumb IP (0-80) 40    Thumb Radial abd/add (0-55) 35   40  Thumb Palmar abd/add (0-45) 40   45  Thumb Opposition to Small Finger Opposition to 5th - pull at thumb   Base of 5th   Index MCP (0-90)      Index PIP (0-100)      Index DIP (0-70)       Long MCP (0-90)       Long PIP (0-100)       Long DIP (0-70)       Ring MCP (0-90)  Ring PIP (0-100)       Ring DIP (0-70)       Little MCP (0-90)       Little PIP (0-100)       Little DIP (0-70)       (Blank rows = not tested)   HAND FUNCTION: Grip strength: Right:   lbs; Left:   lbs, Lateral pinch: Right:   lbs, Left:   lbs, and 3 point pinch: Right:   lbs, Left:   lbs  COORDINATION: Decrease because of increase stiffness and immobilization still  SENSATION: Patient reports some numbness in the tip of thumb but improving  EDEMA: Over wrist and dorsal hand  COGNITION: Overall cognitive status: Within functional limits for tasks assessed     TREATMENT DATE: 09/04/24                                                                                                                            Patient accompanied by god daughter - educated pt and god daughter on edema massage to MCP and thumb webspace.  Pt with good recall of scar mobilizations - reports her family member accidentally threw away scar patch, issued new [employer] scar pad for nighttime wear.  Educated on use of water bowls for contrast bath at home  Modalities: Contrast bath Time: 8 Location: Left forearm   Issued new Tubigrip D and S to wear with wrist brace.  Reviewed HEP with good recall of opposition.  Completed 1 set x 10 reps forearm pronation/supination with R hand stabilizing at forearm to prevent compensatory movements.  Neutral wrist ext/flexion with loose fist Thoracic roll for t-spine stretches seated in chair, reviewed importance of posture with increased time spent sitting.     HEP reviewed: 10 reps tendon glides Soft tissue mobilization in the thumb webspace prior to active range of motion palmar radial abduction-patient can perform active assisted range of motion for endrange Opposition to all digits and sliding down the fifth Gentle wrist flexion and extension loose fist Palms together active range of motion radial ulnar deviation pain-free And gentle supination focusing on forearm with loose fist or open hand with pain less than 2/10-use or heat to facilitate supination     PATIENT EDUCATION: Education details: findings of eval and HEP  Person educated: Patient Education method: Explanation, Demonstration, Tactile cues, Verbal cues, and Handouts Education comprehension: verbalized understanding, returned demonstration, verbal cues required, and needs further education   GOALS: Goals reviewed with patient? Yes  LONG TERM GOALS: Target date: 10 wks  Patient to be independent in home program to decrease  pain and hypersensitivity in left hand and pain score for PRWHE improved with more than 20 points Baseline: Patient arrived with 4/10 pain at rest in brace.  Unable to tolerate wrist brace fastened appropriately to support wrist Goal status: INITIAL  2.  Patient to be independent in home program to improve thumb active range of motion in all planes within normal  limits symptom-free Baseline: 4/10 pain arriving at rest.  Limited palmar radial abduction 35 to 40 degrees discomfort with opposition to fifth. Goal status: INITIAL  3.  Patient to be independent in home program to increase wrist active range of motion for flexion, extension and radial ulnar deviation to within functional limits symptom-free to use hand 50% in bathing and dressing Baseline: Wrist flexion 45 extension 30 degrees with ulnar deviation 18 and radial deviation 5 pain at rest coming in 4/10 and no knowledge of home program Goal status: INITIAL  4.  Left supination improved to  within functional limits symptom-free for patient be able to turn doorknob Baseline: Supination to neutral.  Patient arrived with pain 4/10 at rest Goal status: INITIAL  5.  Left wrist strength improved to within functional limits for PRWHE function score to improve more than 20 pounds Baseline: PRWHE function score 48/50.  Limited in wrist motion in all planes with pain at rest coming in 4/10; 2 weeks postop Goal status: INITIAL  6.  Left grip and prehension strength improved to within normal range for age to be able to carry groceries and cut food and return to doing crafts Baseline: 2 weeks postop Goal status: INITIAL   ASSESSMENT:  Pamela Calhoun: Patient seen for occupational therapy evaluation for left wrist distal radius fracture with ORIF by Dr. Ezra on 08/09/2024.  Patient arrive with wrist brace loosely on.  Reporting 4/10 pain at rest.  With some hypersensitivity to light and rougher textures.  Numbness improving in the thumb IP.  Patient responded great on home program and decreasing pain at rest to less than 1/10 and able to perform home program for active range of motion.    NOW: Pt arrived with god daughter - educated god-daughter on edema massage. Pt issued new [employer] scar pad and new Tubigrip for under brace wear. Patient continue with HEP, cues to use R hand to stabilize forearm during exercises and complete wrist flexion/extension in neutral plane due to limited supination AROM. Educated on posture and t-spine stretches with towel roll vertically behind back. Patient limited in active range of motion of digits as well as wrist in all planes with increased edema and pain and decrease strength.  Patient can benefit from skilled OT services to decrease edema and pain and increase motion and strength to return to prior level of function.  PERFORMANCE DEFICITS: in functional skills including ADLs, IADLs, ROM, strength, pain, flexibility, decreased knowledge of use of DME,  and UE functional use,   and psychosocial skills including environmental adaptation and routines and behaviors.   IMPAIRMENTS: are limiting patient from ADLs, IADLs, rest and sleep, play, leisure, and social participation.   COMORBIDITIES: has no other co-morbidities that affects occupational performance. Patient will benefit from skilled OT to address above impairments and improve overall function.  MODIFICATION OR ASSISTANCE TO COMPLETE EVALUATION: No modification of tasks or assist necessary to complete an evaluation.  OT OCCUPATIONAL PROFILE AND HISTORY: Problem focused assessment: Including review of records relating to presenting problem.  Pamela DECISION MAKING: LOW - limited treatment options, no task modification necessary  REHAB POTENTIAL: Good for goals  EVALUATION COMPLEXITY: Low      PLAN:  OT FREQUENCY: 1-2x/week  OT DURATION: 10 weeks  PLANNED INTERVENTIONS: 97168 OT Re-evaluation, 97535 self care/ADL training, 02889 therapeutic exercise, 97530 therapeutic activity, 97112 neuromuscular re-education, 97140 manual therapy, 97018 paraffin, 02960 fluidotherapy, 97034 contrast bath, 97760 Orthotic Initial, [id number] Orthotic/Prosthetic subsequent, scar mobilization, passive range of motion, patient/family education,  and DME and/or AE instructions    CONSULTED AND AGREED WITH PLAN OF CARE: Patient     Elston JINNY Slot, OTR/L 09/04/2024, 4:06 PM

## 2024-09-11 ENCOUNTER — Encounter: Payer: Self-pay | Admitting: Occupational Therapy

## 2024-09-11 ENCOUNTER — Ambulatory Visit: Admitting: Occupational Therapy

## 2024-09-11 DIAGNOSIS — M25532 Pain in left wrist: Secondary | ICD-10-CM | POA: Diagnosis not present

## 2024-09-11 DIAGNOSIS — M25632 Stiffness of left wrist, not elsewhere classified: Secondary | ICD-10-CM

## 2024-09-11 DIAGNOSIS — L905 Scar conditions and fibrosis of skin: Secondary | ICD-10-CM

## 2024-09-11 NOTE — Therapy (Signed)
 OUTPATIENT OCCUPATIONAL THERAPY ORTHO TREATMENT  Patient Name: Pamela Calhoun MRN: [id number] DOB:1985-12-15, 38 y.o., female Today's Date: 09/11/2024  PCP: Dr Randeen REFERRING PROVIDER: Dr Ezra  END OF SESSION:  OT End of Session - 09/11/24 1040     Visit Number 4    Number of Visits 18    Date for Recertification  11/02/24    OT Start Time 1040    OT Stop Time 1120    OT Time Calculation (min) 40 min    Activity Tolerance Patient tolerated treatment well    Behavior During Therapy [facility] for tasks assessed/performed           Past Medical History:  Diagnosis Date   Allergy    Anxiety    Bladder spasms    Cancer (HCC)    skin- hystory of melanoma    Chronic interstitial cystitis    Depression    Endometriosis    Fracture    right foot   Frequent UTI    Generalized anxiety disorder with panic attacks    History of shingles    Interstitial cystitis    Miscarriage at 8 to [redacted] weeks gestation    December 2023   Obsessive-compulsive disorder    Pap smear, abnormal    cryosx    PID (acute pelvic inflammatory disease)    hx of   PTSD (post-traumatic stress disorder)    Ureteral reflux    as a child    Past Surgical History:  Procedure Laterality Date   DILATION AND CURETTAGE OF UTERUS     EXCISION OF SKIN TAG N/A 02/11/2016   Procedure: EXCISION OF ANAL SKIN TAG AN INTERNAL PAPILLA ;  Surgeon: Bernarda Ned, MD;  Location: [facility];  Service: General;  Laterality: N/A;   LAPAROSCOPIC APPENDECTOMY N/A 04/22/2023   Procedure: APPENDECTOMY LAPAROSCOPIC;  Surgeon: Signe Mitzie LABOR, MD;  Location: [facility];  Service: General;  Laterality: N/A;   LAPAROSCOPY     pelvic    MELANOMA EXCISION     Right leg    OPEN REDUCTION INTERNAL FIXATION (ORIF) DISTAL RADIAL FRACTURE Left 08/09/2024   Procedure: OPEN REDUCTION INTERNAL FIXATION (ORIF) DISTAL RADIUS FRACTURE;  Surgeon: Ezra Jackquline RAMAN, MD;  Location: [facility];  Service: Orthopedics;   Laterality: Left;   TONSILLECTOMY     Patient Active Problem List   Diagnosis Date Noted   Elevated antinuclear antibody (ANA) level 05/10/2024   Miscarriage at 8 to [redacted] weeks gestation    Low back pain radiating to lower extremity 05/02/2024   Thoracic back pain 05/02/2024   Fatigue 05/02/2024   Myofascial pain 05/02/2024   Joint pain 05/02/2024   MDD (major depressive disorder), recurrent episode, severe (HCC) 04/05/2024   GAD (generalized anxiety disorder) 04/05/2024   Extrinsic asthma 11/21/2021   Otalgia of both ears 07/03/2020   Benign paroxysmal positional vertigo 07/03/2020   History of melanoma 07/25/2019   Atypical squamous cells of undetermined significance on cytologic smear of cervix (ASC-US ) 06/01/2018   Hydradenitis 10/26/2017   Screen for STD (sexually transmitted disease) 01/23/2015   MRSA (methicillin resistant Staphylococcus aureus) carrier 04/12/2014   INTERSTITIAL CYSTITIS 06/25/2010   Allergic rhinitis 03/22/2007   Personal history of other malignant neoplasm of skin 03/22/2007    ONSET DATE: 08/09/24  REFERRING DIAG: L distal radius fx with ORIF  THERAPY DIAG:  Stiffness of left wrist, not elsewhere classified  Pain in left wrist  Scar tissue  Rationale for Evaluation and Treatment: Rehabilitation  SUBJECTIVE:   SUBJECTIVE STATEMENT: Reports she hit her arm on the counter and experience pain/swelling, is improving.  Pt accompanied by: self  PERTINENT HISTORY:  ORTHO NOTE 08/22/24 Left Upper Extremity: Incision is clean, dry, intact. No significant erythema, drainage, or other signs of infection. Sutures are in place. Mild ecchymosis about the incision site. Patient is able to almost make a full fist. Patient has very limited wrist flexion and extension. Patient is able to demonstrate almost full pro pronation but has very limited supination. Sensation is intact to light touch throughout. She exhibits some hyperesthesia during the examination. All  digits are warm well-perfused.  Imaging and Results: Radiographs of the left wrist obtained in the clinic today were personally reviewed. The hardware placed remains in appropriate position without evidence of failure, screw backout. The fracture remains appropriately reduced.  Assessment & Plan: Pamela Calhoun is a 38 y.o. female patient who is s/p left distal radius fracture ORIF - Sutures removed in clinic today - Transitioned to a removable wrist brace - Continue NWB for another 4 wks - OTC pain medications as needed -Referral to occupational therapy placed for range of motion exercises and desensitization training - Follow-up in 4 weeks   PRECAUTIONS: NWB on L UE 4 wks, prefab wrist brace on      WEIGHT BEARING RESTRICTIONS: NBW 4 wks  PAIN:  Are you having pain?  4/10 at the radial and ulnar wrist with supination  FALLS: Has patient fallen in last 6 months? Yes. 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  [place] and spend time with daughter daughters ages 68, 43 and 49-likes to paint and do crafts  PATIENT GOALS: I want the pain better and get my motion and strength back that I can use my hand  NEXT MD VISIT: 09/22/24  OBJECTIVE:  Note: Objective measures were completed at Evaluation unless otherwise noted.  HAND DOMINANCE: Right  ADLs: Patient is 2 weeks postop still in brace.  Not able to use right hand  FUNCTIONAL OUTCOME MEASURES: PRWHE function 48/50 pain 44/50  UPPER EXTREMITY ROM:     Active ROM Right eval Left eval L 08/29/24 L 09/11/24  Shoulder flexion      Shoulder abduction      Shoulder adduction      Shoulder extension      Shoulder internal rotation      Shoulder external rotation      Elbow flexion      Elbow extension      Wrist flexion  45 52 50  Wrist extension  30 40 40  Wrist ulnar deviation  18 25 30   Wrist radial deviation  5 12 15   Wrist pronation  90 90 90   Wrist supination  neutral 40 3/10 pain 70  (Blank rows = not tested)  Active ROM Right eval Left eval L   Thumb MCP (0-60) 60    Thumb IP (0-80) 40    Thumb Radial abd/add (0-55) 35   40  Thumb Palmar abd/add (0-45) 40   45  Thumb Opposition to Small Finger Opposition to 5th - pull at thumb   Base of 5th   Index MCP (0-90)      Index PIP (0-100)      Index DIP (0-70)       Long MCP (0-90)       Long PIP (0-100)  Long DIP (0-70)       Ring MCP (0-90)       Ring PIP (0-100)       Ring DIP (0-70)       Little MCP (0-90)       Little PIP (0-100)       Little DIP (0-70)       (Blank rows = not tested)   HAND FUNCTION: Grip strength: Right:   lbs; Left:   lbs, Lateral pinch: Right:   lbs, Left:   lbs, and 3 point pinch: Right:   lbs, Left:   lbs  COORDINATION: Decrease because of increase stiffness and immobilization still  SENSATION: Patient reports some numbness in the tip of thumb but improving  EDEMA: Over wrist and dorsal hand  COGNITION: Overall cognitive status: Within functional limits for tasks assessed     TREATMENT DATE: 09/11/24                                                                                                                             Modalities: Contrast bath Time: 8 Location: Left forearm   Completed contrast bath to address pain and swelling.  Noted redness to thumb webspace, suspect from brace, added soft padding.   Soft tissue massage to volar forearm and thumb webspace with pt reporting pain from bumping forearm on counter - educated on use of ice to address contusion/swelling.  Completed scar mobilization noting improved proximal portion of scar, continue to note scar tissue at distal third portion of scar.    Reviewed HEP with handout provided.   HEP reviewed: increased to 3 sets x 10 reps complete 2x daily.  10 reps tendon glides Opposition to all digits and sliding down the fifth Gentle wrist flexion and extension  loose fist Palms together active range of motion radial ulnar deviation pain-free gentle supination focusing on forearm with loose fist or open hand with pain less than 2/10-use of heat to facilitate supination  forearm pronation/supination with R hand stabilizing at forearm to prevent compensatory movements.  Neutral wrist ext/flexion with loose fist Tabletop radial and ulnar deviation with R hand to hold forearm still.     PATIENT EDUCATION: Education details: findings of eval and HEP  Person educated: Patient Education method: Explanation, Demonstration, Tactile cues, Verbal cues, and Handouts Education comprehension: verbalized understanding, returned demonstration, verbal cues required, and needs further education   GOALS: Goals reviewed with patient? Yes  LONG TERM GOALS: Target date: 10 wks  Patient to be independent in home program to decrease  pain and hypersensitivity in left hand and pain score for PRWHE improved with more than 20 points Baseline: Patient arrived with 4/10 pain at rest in brace.  Unable to tolerate wrist brace fastened appropriately to support wrist Goal status: INITIAL  2.  Patient to be independent in home program to improve thumb active range of motion in all planes within normal limits symptom-free Baseline: 4/10 pain arriving at  rest.  Limited palmar radial abduction 35 to 40 degrees discomfort with opposition to fifth. Goal status: INITIAL  3.  Patient to be independent in home program to increase wrist active range of motion for flexion, extension and radial ulnar deviation to within functional limits symptom-free to use hand 50% in bathing and dressing Baseline: Wrist flexion 45 extension 30 degrees with ulnar deviation 18 and radial deviation 5 pain at rest coming in 4/10 and no knowledge of home program Goal status: INITIAL  4.  Left supination improved to within functional limits symptom-free for patient be able to turn doorknob Baseline:  Supination to neutral.  Patient arrived with pain 4/10 at rest Goal status: INITIAL  5.  Left wrist strength improved to within functional limits for PRWHE function score to improve more than 20 pounds Baseline: PRWHE function score 48/50.  Limited in wrist motion in all planes with pain at rest coming in 4/10; 2 weeks postop Goal status: INITIAL  6.  Left grip and prehension strength improved to within normal range for age to be able to carry groceries and cut food and return to doing crafts Baseline: 2 weeks postop Goal status: INITIAL   ASSESSMENT:  CLINICAL IMPRESSION: Patient seen for occupational therapy evaluation for left wrist distal radius fracture with ORIF by Dr. Ezra on 08/09/2024.  Patient arrive with wrist brace loosely on.  Reporting 4/10 pain at rest.  With some hypersensitivity to light and rougher textures.  Numbness improving in the thumb IP.  Patient responded great on home program and decreasing pain at rest to less than 1/10 and able to perform home program for active range of motion.    NOW: Improving forearm supination to 70*, reports use of heating pad at home. Ulnar deviation improved to 30*, added prayer stretch with radial/ulnar deviation. Continues to tolerate contrast bath and soft tissue massage, with good improvement of scar. Redness noted to thumb webspace, added padding. Patient continue with HEP, added radial / ulnar deviation at tabletop with cues to use R hand to stabilize forearm during exercises. Patient limited in active range of motion of digits as well as wrist in all planes with increased edema and pain and decrease strength.  Patient can benefit from skilled OT services to decrease edema and pain and increase motion and strength to return to prior level of function.  PERFORMANCE DEFICITS: in functional skills including ADLs, IADLs, ROM, strength, pain, flexibility, decreased knowledge of use of DME, and UE functional use,   and psychosocial skills  including environmental adaptation and routines and behaviors.   IMPAIRMENTS: are limiting patient from ADLs, IADLs, rest and sleep, play, leisure, and social participation.   COMORBIDITIES: has no other co-morbidities that affects occupational performance. Patient will benefit from skilled OT to address above impairments and improve overall function.  MODIFICATION OR ASSISTANCE TO COMPLETE EVALUATION: No modification of tasks or assist necessary to complete an evaluation.  OT OCCUPATIONAL PROFILE AND HISTORY: Problem focused assessment: Including review of records relating to presenting problem.  CLINICAL DECISION MAKING: LOW - limited treatment options, no task modification necessary  REHAB POTENTIAL: Good for goals  EVALUATION COMPLEXITY: Low      PLAN:  OT FREQUENCY: 1-2x/week  OT DURATION: 10 weeks  PLANNED INTERVENTIONS: 97168 OT Re-evaluation, 97535 self care/ADL training, 02889 therapeutic exercise, 97530 therapeutic activity, 97112 neuromuscular re-education, 97140 manual therapy, 97018 paraffin, 02960 fluidotherapy, 97034 contrast bath, 97760 Orthotic Initial, [id number] Orthotic/Prosthetic subsequent, scar mobilization, passive range of motion, patient/family education, and DME and/or  AE instructions    CONSULTED AND AGREED WITH PLAN OF CARE: Patient     Elston JINNY Slot, OTR/L 09/11/2024, 11:58 AM

## 2024-09-20 NOTE — Progress Notes (Signed)
    ORTHOPAEDIC SURGERY- CLINIC NOTE  Surgery: Left distal radius ORIF, 08/09/2024  History of Present Illness:  Pamela Calhoun is a 38 y.o. female who presents today for a postoperative visit s/p the above procedure.  She is now about 6 weeks out from her fracture fixation.  At her first postoperative visit she was doing okay but still dealing with quite a bit of pain, hypersensitivity, and limited range of motion.  I sent her to occupational therapy for range of motion, edema control, pain control modalities.  Per review of the therapy notes and per report of the patient, this has been very helpful and she has been making gains.  She reports that she had a recent minor fall landing on her outstretched left hand onto a nearby railing.  She was worried that she caused further damage to the fixation.  Otherwise, she feels that she is making a lot of improvement and is happy with her progress so far.  She is very satisfied with occupational therapy and feels that it has been helping quite a bit.  Physical Exam: BP 122/84   Ht 177.8 cm (5' 10)   Wt 77.6 kg (171 lb)   BMI 24.54 kg/m  General/Constitutional: No apparent distress: well-nourished and well developed. Eyes: Pupils equal, round with synchronous movement. Neuro/Psych: Normal mood and affect, oriented to person, place and time. Musculoskeletal: see exam below  Left Upper Extremity: Incision is well-healed without signs of infection.  She is able to make a full composite fist.  Sensation is intact to light touch to all of her digits.  Her digits are warm and well-perfused.  She has almost full pronation.  She has about 30 to 40 degrees of supination.  Her wrist flexion /extension is also significantly improved.  Imaging and Results: Radiographs of the left wrist obtained in the clinic today were personally reviewed.  Reduction is well maintained.  Hardware is intact without any signs of complications, loosening, breakage.  There is  some signs of healing particular about the dorsal aspect of the distal radius as well as the ulnar aspect of the fracture.  Distal ulna fracture also with signs of callus formation.  Assessment & Plan: Pamela Calhoun is a 38 y.o. female patient who is s/p left distal radius fracture ORIF, 08/09/24 - Discontinue removable wrist brace as tolerated - May begin progressive weightbearing/activities as tolerated - Continue working with occupational therapy as needed/determined by patient and therapist - Follow-up in 6 weeks   Pamela CANDIE Barrack, MD [facility] [address] [place], [place] [address] Phone: [phone]  This note was generated in part with voice recognition software; please excuse any typographical errors that were not detected and corrected.

## 2024-09-21 ENCOUNTER — Ambulatory Visit: Admitting: Occupational Therapy

## 2024-09-26 ENCOUNTER — Ambulatory Visit: Admitting: Occupational Therapy

## 2024-09-28 ENCOUNTER — Ambulatory Visit: Admitting: Occupational Therapy

## 2024-10-03 ENCOUNTER — Ambulatory Visit: Admitting: Occupational Therapy

## 2024-10-03 DIAGNOSIS — L905 Scar conditions and fibrosis of skin: Secondary | ICD-10-CM | POA: Insufficient documentation

## 2024-10-03 DIAGNOSIS — M25632 Stiffness of left wrist, not elsewhere classified: Secondary | ICD-10-CM | POA: Diagnosis present

## 2024-10-03 DIAGNOSIS — M6281 Muscle weakness (generalized): Secondary | ICD-10-CM | POA: Insufficient documentation

## 2024-10-03 DIAGNOSIS — M25532 Pain in left wrist: Secondary | ICD-10-CM | POA: Insufficient documentation

## 2024-10-03 NOTE — Therapy (Signed)
 OUTPATIENT OCCUPATIONAL THERAPY ORTHO TREATMENT  Patient Name: Pamela Calhoun MRN: [id number] DOB:[birth year], 38 y.o., female Today's Date: 10/03/2024  PCP: Dr Randeen REFERRING PROVIDER: Dr Ezra  END OF SESSION:  OT End of Session - 10/03/24 1407     Visit Number 5    Number of Visits 18    Date for Recertification  11/02/24    OT Start Time 1404    OT Stop Time 1445    OT Time Calculation (min) 41 min    Activity Tolerance Patient tolerated treatment well    Behavior During Therapy [facility] for tasks assessed/performed           Past Medical History:  Diagnosis Date   Allergy    Anxiety    Bladder spasms    Cancer (HCC)    skin- hystory of melanoma    Chronic interstitial cystitis    Depression    Endometriosis    Fracture    right foot   Frequent UTI    Generalized anxiety disorder with panic attacks    History of shingles    Interstitial cystitis    Miscarriage at 8 to [redacted] weeks gestation    December 2023   Obsessive-compulsive disorder    Pap smear, abnormal    cryosx    PID (acute pelvic inflammatory disease)    hx of   PTSD (post-traumatic stress disorder)    Ureteral reflux    as a child    Past Surgical History:  Procedure Laterality Date   DILATION AND CURETTAGE OF UTERUS     EXCISION OF SKIN TAG N/A 02/11/2016   Procedure: EXCISION OF ANAL SKIN TAG AN INTERNAL PAPILLA ;  Surgeon: Bernarda Ned, MD;  Location: [facility];  Service: General;  Laterality: N/A;   LAPAROSCOPIC APPENDECTOMY N/A 04/22/2023   Procedure: APPENDECTOMY LAPAROSCOPIC;  Surgeon: Signe Mitzie LABOR, MD;  Location: [facility] OR;  Service: General;  Laterality: N/A;   LAPAROSCOPY     pelvic    MELANOMA EXCISION     Right leg    OPEN REDUCTION INTERNAL FIXATION (ORIF) DISTAL RADIAL FRACTURE Left 08/09/2024   Procedure: OPEN REDUCTION INTERNAL FIXATION (ORIF) DISTAL RADIUS FRACTURE;  Surgeon: Ezra Jackquline RAMAN, MD;  Location: [facility];  Service: Orthopedics;   Laterality: Left;   TONSILLECTOMY     Patient Active Problem List   Diagnosis Date Noted   Elevated antinuclear antibody (ANA) level 05/10/2024   Miscarriage at 8 to [redacted] weeks gestation    Low back pain radiating to lower extremity 05/02/2024   Thoracic back pain 05/02/2024   Fatigue 05/02/2024   Myofascial pain 05/02/2024   Joint pain 05/02/2024   MDD (major depressive disorder), recurrent episode, severe (HCC) 04/05/2024   GAD (generalized anxiety disorder) 04/05/2024   Extrinsic asthma 11/21/2021   Otalgia of both ears 07/03/2020   Benign paroxysmal positional vertigo 07/03/2020   History of melanoma 07/25/2019   Atypical squamous cells of undetermined significance on cytologic smear of cervix (ASC-US ) 06/01/2018   Hydradenitis 10/26/2017   Screen for STD (sexually transmitted disease) 01/23/2015   MRSA (methicillin resistant Staphylococcus aureus) carrier 04/12/2014   INTERSTITIAL CYSTITIS 06/25/2010   Allergic rhinitis 03/22/2007   Personal history of other malignant neoplasm of skin 03/22/2007    ONSET DATE: 08/09/24  REFERRING DIAG: L distal radius fx with ORIF  THERAPY DIAG:  Stiffness of left wrist, not elsewhere classified  Pain in left wrist  Scar tissue  Muscle weakness (generalized)  Rationale for  Evaluation and Treatment: Rehabilitation  SUBJECTIVE:   SUBJECTIVE STATEMENT: I feel much better than last week.  Had a fever and did not feel good.  Was not able to do a lot of my exercises.  I am taking my splint more off.  But wearing it when I am driving more out and about. Pt accompanied by: self  PERTINENT HISTORY:  ORTHO NOTE 08/22/24 Left Upper Extremity: Incision is clean, dry, intact. No significant erythema, drainage, or other signs of infection. Sutures are in place. Mild ecchymosis about the incision site. Patient is able to almost make a full fist. Patient has very limited wrist flexion and extension. Patient is able to demonstrate almost full pro  pronation but has very limited supination. Sensation is intact to light touch throughout. She exhibits some hyperesthesia during the examination. All digits are warm well-perfused.  Imaging and Results: Radiographs of the left wrist obtained in the clinic today were personally reviewed. The hardware placed remains in appropriate position without evidence of failure, screw backout. The fracture remains appropriately reduced.  Assessment & Plan: Pamela Calhoun is a 38 y.o. female patient who is s/p left distal radius fracture ORIF - Sutures removed in clinic today - Transitioned to a removable wrist brace - Continue NWB for another 4 wks - OTC pain medications as needed -Referral to occupational therapy placed for range of motion exercises and desensitization training - Follow-up in 4 weeks   PRECAUTIONS: NWB on L UE 4 wks, prefab wrist brace on      WEIGHT BEARING RESTRICTIONS: NBW 4 wks  PAIN:  Are you having pain?  4/10 at the radial and ulnar wrist with supination  FALLS: Has patient fallen in last 6 months? Yes. 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  [place] and spend time with daughter daughters ages 61, 41 and 22-likes to paint and do crafts  PATIENT GOALS: I want the pain better and get my motion and strength back that I can use my hand  NEXT MD VISIT: 09/22/24  OBJECTIVE:  Note: Objective measures were completed at Evaluation unless otherwise noted.  HAND DOMINANCE: Right  ADLs: Patient is 2 weeks postop still in brace.  Not able to use right hand  FUNCTIONAL OUTCOME MEASURES: PRWHE function 48/50 pain 44/50  UPPER EXTREMITY ROM:     Active ROM Right eval Left eval L 08/29/24 L 09/11/24 L 10/03/24  Shoulder flexion       Shoulder abduction       Shoulder adduction       Shoulder extension       Shoulder internal rotation       Shoulder external rotation       Elbow flexion        Elbow extension       Wrist flexion  45 52 50 65 ( 53 loose fist )   Wrist extension  30 40 40 50 ( 55 loose fist   Wrist ulnar deviation  18 25 30 30   Wrist radial deviation  5 12 15 20   Wrist pronation  90 90 90 90  Wrist supination  neutral 40 3/10 pain 70 70 4/10   (Blank rows = not tested)  Active ROM Right eval Left eval L   Thumb MCP (0-60) 60    Thumb IP (0-80) 40    Thumb Radial abd/add (0-55) 35   40  Thumb Palmar abd/add (0-45)  40   45  Thumb Opposition to Small Finger Opposition to 5th - pull at thumb   Base of 5th   Index MCP (0-90)      Index PIP (0-100)      Index DIP (0-70)       Long MCP (0-90)       Long PIP (0-100)       Long DIP (0-70)       Ring MCP (0-90)       Ring PIP (0-100)       Ring DIP (0-70)       Little MCP (0-90)       Little PIP (0-100)       Little DIP (0-70)       (Blank rows = not tested)   HAND FUNCTION: 10/03/24 Grip strength: Right: 70 lbs; Left: 46 lbs, Lateral pinch: Right: 18 lbs, Left: 12 lbs, and 3 point pinch: Right: 18 lbs, Left: 13 lbs  COORDINATION: Decrease because of increase stiffness and immobilization still  SENSATION: Patient reports some numbness in the tip of thumb but improving  EDEMA: Over wrist and dorsal hand  COGNITION: Overall cognitive status: Within functional limits for tasks assessed     TREATMENT DATE: 10/03/24                                                                                                                            Patient arrived today after not being seen for more than 2 weeks. Range of motion measurements taken see flowsheet. As well as grip and prehension strength.  See flowsheet. Patient mostly limited in supination as well as wrist flexion extension.  Modalities: Paraffin Time: 8 Location: Left hand and forearm with 2-minute x 2 supination stretch prior to session   Patient can continues to do heat prior to home exercises 3 times a day Focus this date on passive  range of motion by OT for supination with a place and hold 10 reps each Followed by 1 pound weight 10 reps to 12 reps Change home program for wrist flexion extension ulnar radial deviation over the edge of the table moving elbow on wrist 12 reps 3 times a day Followed by 1 pound weight 12 reps to 15 reps pain-free with wrist extension placed on hold Patient tolerated well.  Reviewed HEP with handout provided.   Reviewed with patient scar mobilization. Scar massage done by OT using mini massager as well as manual therapy tolerated well. Provided patient a new [employer] -Care scar pad for nighttime Will assess if can benefit from Kinesiotape over the weekend after next session.   PATIENT EDUCATION: Education details: findings of eval and HEP  Person educated: Patient Education method: Explanation, Demonstration, Tactile cues, Verbal cues, and Handouts Education comprehension: verbalized understanding, returned demonstration, verbal cues required, and needs further education   GOALS: Goals reviewed with patient? Yes  LONG TERM GOALS: Target date: 10 wks  Patient to be independent in  home program to decrease  pain and hypersensitivity in left hand and pain score for PRWHE improved with more than 20 points Baseline: Patient arrived with 4/10 pain at rest in brace.  Unable to tolerate wrist brace fastened appropriately to support wrist Goal status: INITIAL  2.  Patient to be independent in home program to improve thumb active range of motion in all planes within normal limits symptom-free Baseline: 4/10 pain arriving at rest.  Limited palmar radial abduction 35 to 40 degrees discomfort with opposition to fifth. Goal status: INITIAL  3.  Patient to be independent in home program to increase wrist active range of motion for flexion, extension and radial ulnar deviation to within functional limits symptom-free to use hand 50% in bathing and dressing Baseline: Wrist flexion 45 extension 30  degrees with ulnar deviation 18 and radial deviation 5 pain at rest coming in 4/10 and no knowledge of home program Goal status: INITIAL  4.  Left supination improved to within functional limits symptom-free for patient be able to turn doorknob Baseline: Supination to neutral.  Patient arrived with pain 4/10 at rest Goal status: INITIAL  5.  Left wrist strength improved to within functional limits for PRWHE function score to improve more than 20 pounds Baseline: PRWHE function score 48/50.  Limited in wrist motion in all planes with pain at rest coming in 4/10; 2 weeks postop Goal status: INITIAL  6.  Left grip and prehension strength improved to within normal range for age to be able to carry groceries and cut food and return to doing crafts Baseline: 2 weeks postop Goal status: INITIAL   ASSESSMENT:  CLINICAL IMPRESSION: Patient seen for occupational therapy for left wrist distal radius fracture with ORIF by Dr. Ezra on 08/09/2024.  Patient arrive with wrist brace loosely on.  Patient was not seen for about 2 to 3 weeks.  Was sick last week.  Patient continues to be limited mostly in wrist flexion extension and supination.  Scar adhesions on the volar wrist limiting wrist flexion extension.  As well as decreased grip and prehension strength.  Patient's pain mostly in supination.  Patient made great progress in session.  Able to upgrade home exercises to active assisted range of motion and passive range of motion as well as 1 pound weight for wrist and forearm in all planes.  Reviewed with patient scar massage and provided new [employer] -Care scar pad.  Will assess next session if can benefit from Kinesiotape for facilitation of scar.  Patient can benefit from skilled OT services to decrease edema and pain and increase motion and strength to return to prior level of function.  PERFORMANCE DEFICITS: in functional skills including ADLs, IADLs, ROM, strength, pain, flexibility, decreased knowledge of  use of DME, and UE functional use,   and psychosocial skills including environmental adaptation and routines and behaviors.   IMPAIRMENTS: are limiting patient from ADLs, IADLs, rest and sleep, play, leisure, and social participation.   COMORBIDITIES: has no other co-morbidities that affects occupational performance. Patient will benefit from skilled OT to address above impairments and improve overall function.  MODIFICATION OR ASSISTANCE TO COMPLETE EVALUATION: No modification of tasks or assist necessary to complete an evaluation.  OT OCCUPATIONAL PROFILE AND HISTORY: Problem focused assessment: Including review of records relating to presenting problem.  CLINICAL DECISION MAKING: LOW - limited treatment options, no task modification necessary  REHAB POTENTIAL: Good for goals  EVALUATION COMPLEXITY: Low      PLAN:  OT FREQUENCY: 1-2x/week  OT DURATION: 10 weeks  PLANNED INTERVENTIONS: 97168 OT Re-evaluation, 97535 self care/ADL training, 02889 therapeutic exercise, 97530 therapeutic activity, 97112 neuromuscular re-education, 97140 manual therapy, 97018 paraffin, 02960 fluidotherapy, 97034 contrast bath, 97760 Orthotic Initial, [id number] Orthotic/Prosthetic subsequent, scar mobilization, passive range of motion, patient/family education, and DME and/or AE instructions    CONSULTED AND AGREED WITH PLAN OF CARE: Patient     Ancel Peters, OTR/L 10/03/2024, 6:53 PM

## 2024-10-05 ENCOUNTER — Ambulatory Visit: Admitting: Occupational Therapy

## 2024-10-10 ENCOUNTER — Ambulatory Visit: Admitting: Occupational Therapy

## 2024-10-12 ENCOUNTER — Encounter (HOSPITAL_COMMUNITY): Payer: Self-pay | Admitting: Psychiatry

## 2024-10-12 ENCOUNTER — Telehealth (INDEPENDENT_AMBULATORY_CARE_PROVIDER_SITE_OTHER): Admitting: Psychiatry

## 2024-10-12 DIAGNOSIS — F332 Major depressive disorder, recurrent severe without psychotic features: Secondary | ICD-10-CM

## 2024-10-12 DIAGNOSIS — F411 Generalized anxiety disorder: Secondary | ICD-10-CM | POA: Diagnosis not present

## 2024-10-12 MED ORDER — HYDROXYZINE HCL 25 MG PO TABS: 25.0000 mg | ORAL_TABLET | Freq: Four times a day (QID) | ORAL | 2 refills | Status: DC | PRN

## 2024-10-12 MED ORDER — BUPROPION HCL ER (XL) 300 MG PO TB24: 300.0000 mg | ORAL_TABLET | Freq: Every day | ORAL | 2 refills | Status: DC

## 2024-10-12 MED ORDER — ARIPIPRAZOLE 2 MG PO TABS: 2.0000 mg | ORAL_TABLET | Freq: Every day | ORAL | 2 refills | Status: DC

## 2024-10-12 MED ORDER — CLONAZEPAM 0.5 MG PO TABS: 0.5000 mg | ORAL_TABLET | Freq: Three times a day (TID) | ORAL | 2 refills | Status: DC | PRN

## 2024-10-12 MED ORDER — TRAZODONE HCL 100 MG PO TABS: 100.0000 mg | ORAL_TABLET | Freq: Every day | ORAL | 2 refills | Status: DC

## 2024-10-12 NOTE — Progress Notes (Signed)
 Virtual Visit via Telephone Note  I connected with Pamela Calhoun on 10/12/24 at 11:00 AM EST by telephone and verified that I am speaking with the correct person using two identifiers.  Location: Patient: home Provider: office   I discussed the limitations, risks, security and privacy concerns of performing an evaluation and management service by telephone and the availability of in person appointments. I also discussed with the patient that there may be a patient responsible charge related to this service. The patient expressed understanding and agreed to proceed.      I discussed the assessment and treatment plan with the patient. The patient was provided an opportunity to ask questions and all were answered. The patient agreed with the plan and demonstrated an understanding of the instructions.   The patient was advised to call back or seek an in-person evaluation if the symptoms worsen or if the condition fails to improve as anticipated.  I provided 20 minutes of non-face-to-face time during this encounter.   Barnie Gull, MD  [facility] MD/PA/NP OP Progress Note  10/12/2024 11:22 AM Pamela Calhoun  MRN:  [id number]  Chief Complaint:  Chief Complaint  Patient presents with   Depression   Anxiety   Follow-up   HPI: This patient is a 38 year old single white female who lives with her father in [place].  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  She is currently unemployed.  The patient returns for follow-up after 2 months regarding major depression and generalized anxiety disorder.  She states lately she has not been doing that well.  Her interstitial cystitis keeps her up at night.  She is probably going to need a procedure.  She also broke her left wrist a few months ago and is undergoing physical therapy.  It still hard for her to get going although the Abilify  has helped.  She is eating a lot more and has gained about 10 pounds  but she does not want to change it.  She is going to a Libr[employer], academic which helps to some degree.  However lately she has had thoughts of doing something like burning herself although she does not want to die.  I suggested that she work more with a trained therapist and we will try to set this up.  She is not sleeping all that well and I suggested we go up on the trazodone . Visit Diagnosis:    ICD-10-CM   1. Severe episode of recurrent major depressive disorder, without psychotic features (HCC)  F33.2     2. GAD (generalized anxiety disorder)  F41.1       Past Psychiatric History: None  Past Medical History:  Past Medical History:  Diagnosis Date   Allergy    Anxiety    Bladder spasms    Cancer (HCC)    skin- hystory of melanoma    Chronic interstitial cystitis    Depression    Endometriosis    Fracture    right foot   Frequent UTI    Generalized anxiety disorder with panic attacks    History of shingles    Interstitial cystitis    Miscarriage at 8 to [redacted] weeks gestation    December 2023   Obsessive-compulsive disorder    Pap smear, abnormal    cryosx    PID (acute pelvic inflammatory disease)    hx of   PTSD (post-traumatic stress disorder)    Ureteral reflux  as a child     Past Surgical History:  Procedure Laterality Date   DILATION AND CURETTAGE OF UTERUS     EXCISION OF SKIN TAG N/A 02/11/2016   Procedure: EXCISION OF ANAL SKIN TAG AN INTERNAL PAPILLA ;  Surgeon: Bernarda Ned, MD;  Location: [facility];  Service: General;  Laterality: N/A;   LAPAROSCOPIC APPENDECTOMY N/A 04/22/2023   Procedure: APPENDECTOMY LAPAROSCOPIC;  Surgeon: Signe Mitzie LABOR, MD;  Location: [facility];  Service: General;  Laterality: N/A;   LAPAROSCOPY     pelvic    MELANOMA EXCISION     Right leg    OPEN REDUCTION INTERNAL FIXATION (ORIF) DISTAL RADIAL FRACTURE Left 08/09/2024   Procedure: OPEN REDUCTION INTERNAL FIXATION (ORIF) DISTAL RADIUS FRACTURE;  Surgeon: Ezra Jackquline RAMAN, MD;  Location: [facility];  Service: Orthopedics;  Laterality: Left;   TONSILLECTOMY      Family Psychiatric History: See below  Family History:  Family History  Problem Relation Age of Onset   Bipolar disorder Mother    Diabetes Mother    Hypertension Mother    Neuropathy Mother        diabetic   Depression Mother    Asthma Father    Cancer Maternal Grandmother        breast   Bipolar disorder Paternal Grandmother    Dementia Paternal Grandmother     Social History:  Social History   Socioeconomic History   Marital status: Single    Spouse name: Not on file   Number of children: 0   Years of education: Not on file   Highest education level: Bachelor's degree (e.g., BA, AB, BS)  Occupational History   Not on file  Tobacco Use   Smoking status: Former    Current packs/day: 0.00    Types: Cigarettes    Quit date: 02/04/2012    Years since quitting: 12.6   Smokeless tobacco: Never  Vaping Use   Vaping status: Never Used  Substance and Sexual Activity   Alcohol use: Yes    Alcohol/week: 1.0 standard drink of alcohol    Types: 1 Standard drinks or equivalent per week    Comment: every other day maybe 1 beer   Drug use: Not Currently   Sexual activity: Not on file  Other Topics Concern   Not on file  Social History Narrative   Not on file   Social Drivers of Health   Financial Resource Strain: Medium Risk (09/21/2024)   [facility]   Overall Financial Resource Strain (CARDIA)    Difficulty of Paying Living Expenses: Somewhat hard  Food Insecurity: Food Insecurity Present (09/21/2024)   [facility] from [facility]   Hunger Vital Sign    Within the past 12 months, you worried that your food would run out before you got the money to buy more.: Sometimes true    Within the past 12 months, the food you bought just didn't last and you didn't have money to get more.: Often true  Transportation  Needs: No Transportation Needs (09/21/2024)   Received from [facility] - Transportation    In the past 12 months, has lack of transportation kept you from medical appointments or from getting medications?: No    Lack of Transportation (Non-Medical): No  Physical Activity: Not on file  Stress: Not on file  Social Connections: Not on file    Allergies: No Active Allergies  Metabolic Disorder  Labs: No results found for: HGBA1C, MPG No results found for: PROLACTIN Lab Results  Component Value Date   CHOL 182 03/24/2018   TRIG 98.0 03/24/2018   HDL 57.90 03/24/2018   CHOLHDL 3 03/24/2018   VLDL 19.6 03/24/2018   LDLCALC 105 (H) 03/24/2018   Lab Results  Component Value Date   TSH 0.68 05/05/2024   TSH 1.46 03/24/2018    Therapeutic Level Labs: No results found for: LITHIUM No results found for: VALPROATE No results found for: CBMZ  Current Medications: Current Outpatient Medications  Medication Sig Dispense Refill   traZODone  (DESYREL ) 100 MG tablet Take 1 tablet (100 mg total) by mouth at bedtime. 30 tablet 2   albuterol  (VENTOLIN  HFA) 108 (90 Base) MCG/ACT inhaler Inhale 2 puffs into the lungs every 4 (four) hours as needed for wheezing. 18 g 0   ARIPiprazole  (ABILIFY ) 2 MG tablet Take 1 tablet (2 mg total) by mouth daily. 30 tablet 2   baclofen (LIORESAL) 10 MG tablet Take 5 mg by mouth 3 (three) times daily.     buPROPion  (WELLBUTRIN  XL) 300 MG 24 hr tablet Take 1 tablet (300 mg total) by mouth daily. 30 tablet 2   clonazePAM  (KLONOPIN ) 0.5 MG tablet Take 1 tablet (0.5 mg total) by mouth 3 (three) times daily as needed for anxiety. 90 tablet 2   ELMIRON 100 MG capsule Take 2 tablets by mouth at bedtime.   11   hydrOXYzine  (ATARAX ) 25 MG tablet Take 1 tablet (25 mg total) by mouth every 6 (six) hours as needed. 60 tablet 2   ibuprofen  (ADVIL ) 800 MG tablet Take 1 tablet (800 mg total) by mouth every 8 (eight) hours as needed. 30  tablet 0   ondansetron  (ZOFRAN -ODT) 4 MG disintegrating tablet Take 1 tablet (4 mg total) by mouth every 6 (six) hours as needed for nausea or vomiting. 20 tablet 0   oxyCODONE  (ROXICODONE ) 5 MG immediate release tablet Take 1 tablet (5 mg total) by mouth every 4 (four) hours as needed for up to 15 doses for severe pain (pain score 7-10) or breakthrough pain. 15 tablet 0   oxyCODONE  10 MG TABS Take 1 tablet (10 mg total) by mouth every 8 (eight) hours as needed. 15 tablet 0   No current facility-administered medications for this visit.     Musculoskeletal: Strength & Muscle Tone: na Gait & Station: na Patient leans: N/A  Psychiatric Specialty Exam: Review of Systems  Genitourinary:  Positive for dysuria.  Musculoskeletal:  Positive for arthralgias and myalgias.  Psychiatric/Behavioral:  Positive for dysphoric mood and sleep disturbance.   All other systems reviewed and are negative.   unknown if currently breastfeeding.There is no height or weight on file to calculate BMI.  General Appearance: na  Eye Contact:  NA  Speech:  Clear and Coherent  Volume:  Normal  Mood:  Dysphoric  Affect:  NA  Thought Process:  Goal Directed  Orientation:  Full (Time, Place, and Person)  Thought Content: Rumination   Suicidal Thoughts:  No  Homicidal Thoughts:  No  Memory:  Immediate;   Good Recent;   Good Remote;   Good  Judgement:  Good  Insight:  Good  Psychomotor Activity:  Decreased  Concentration:  Concentration: Good and Attention Span: Good  Recall:  Good  Fund of Knowledge: Good  Language: Good  Akathisia:  No  Handed:  Right  AIMS (if indicated): not done  Assets:  Communication Skills Desire for Improvement Resilience Social Support  Talents/Skills  ADL's:  Intact  Cognition: WNL  Sleep:  Poor   Screenings: GAD-7    Flowsheet Row Office Visit from 05/02/2024 in [facility]  Total GAD-7 Score 19   PHQ2-9    Flowsheet Row Counselor  from 05/10/2024 in [facility] Office Visit from 05/02/2024 in [facility] [place] Counselor from 04[facility]05/2024 in [facility] Office Visit from 01/01/2024 in [facility] Office Visit from 04/07/2022 in [facility] [place]  PHQ-2 Total Score 6 6 6 5  0  PHQ-9 Total Score 23 22 19 23  --   Flowsheet Row Admission (Discharged) from 08/09/2024 in [facility] from 08/03/2024 in [facility] ED from 08/01/2024 in [facility] at [facility]  C-SSRS RISK CATEGORY No Risk No Risk No Risk     Assessment and Plan: This patient is a 38 year old female with a history of posttraumatic stress disorder major depression and generalized anxiety.  As she is approaching the anniversary of her mother's death she seems to be declining a bit.  She is has some self-harm thoughts but has not acted on them.  She is not sleeping well so we will increase trazodone  to 100 mg at bedtime for sleep.  She will continue Wellbutrin  XL 300 mg daily for depression Abilify  2 mg daily for augmentation, clonazepam  0.5 mg 3 times daily as needed for anxiety and hydroxyzine  25 mg every 6 hours as needed for anxiety.  She will return to see me in 6 weeks  Collaboration of Care: Collaboration of Care: Primary Care Provider AEB patient will be referred to a therapist in our office  Patient/Guardian was advised Release of Information must be obtained prior to any record release in order to collaborate their care with an outside provider. Patient/Guardian was advised if they have not already done so to contact the registration department to sign all necessary forms in order for us  to release information regarding their care.   Consent: Patient/Guardian gives verbal consent for treatment and assignment of benefits  for services provided during this visit. Patient/Guardian expressed understanding and agreed to proceed.    Barnie Gull, MD 10/12/2024, 11:22 AM

## 2024-10-24 ENCOUNTER — Encounter: Payer: Self-pay | Admitting: Occupational Therapy

## 2024-10-24 ENCOUNTER — Ambulatory Visit: Payer: MEDICAID | Admitting: Occupational Therapy

## 2024-10-24 DIAGNOSIS — L905 Scar conditions and fibrosis of skin: Secondary | ICD-10-CM | POA: Insufficient documentation

## 2024-10-24 DIAGNOSIS — M25632 Stiffness of left wrist, not elsewhere classified: Secondary | ICD-10-CM | POA: Diagnosis present

## 2024-10-24 DIAGNOSIS — M25532 Pain in left wrist: Secondary | ICD-10-CM | POA: Insufficient documentation

## 2024-10-24 NOTE — Therapy (Signed)
 OUTPATIENT OCCUPATIONAL THERAPY ORTHO TREATMENT  Patient Name: Pamela Calhoun MRN: [id number] DOB:01/11/1986, 38 y.o., female Today's Date: 10/24/2024  PCP: Dr Randeen REFERRING PROVIDER: Dr Ezra  END OF SESSION:  OT End of Session - 10/24/24 1603     Visit Number 6    Number of Visits 18    Date for Recertification  11/02/24    OT Start Time 1600    OT Stop Time 1630    OT Time Calculation (min) 30 min    Activity Tolerance Patient tolerated treatment well    Behavior During Therapy [facility] for tasks assessed/performed           Past Medical History:  Diagnosis Date   Allergy    Anxiety    Bladder spasms    Cancer (HCC)    skin- hystory of melanoma    Chronic interstitial cystitis    Depression    Endometriosis    Fracture    right foot   Frequent UTI    Generalized anxiety disorder with panic attacks    History of shingles    Interstitial cystitis    Miscarriage at 8 to [redacted] weeks gestation    December 2023   Obsessive-compulsive disorder    Pap smear, abnormal    cryosx    PID (acute pelvic inflammatory disease)    hx of   PTSD (post-traumatic stress disorder)    Ureteral reflux    as a child    Past Surgical History:  Procedure Laterality Date   DILATION AND CURETTAGE OF UTERUS     EXCISION OF SKIN TAG N/A 02/11/2016   Procedure: EXCISION OF ANAL SKIN TAG AN INTERNAL PAPILLA ;  Surgeon: Bernarda Ned, MD;  Location: [facility];  Service: General;  Laterality: N/A;   LAPAROSCOPIC APPENDECTOMY N/A 04/22/2023   Procedure: APPENDECTOMY LAPAROSCOPIC;  Surgeon: Signe Mitzie LABOR, MD;  Location: [facility] OR;  Service: General;  Laterality: N/A;   LAPAROSCOPY     pelvic    MELANOMA EXCISION     Right leg    OPEN REDUCTION INTERNAL FIXATION (ORIF) DISTAL RADIAL FRACTURE Left 08/09/2024   Procedure: OPEN REDUCTION INTERNAL FIXATION (ORIF) DISTAL RADIUS FRACTURE;  Surgeon: Ezra Jackquline RAMAN, MD;  Location: [facility];  Service: Orthopedics;   Laterality: Left;   TONSILLECTOMY     Patient Active Problem List   Diagnosis Date Noted   Elevated antinuclear antibody (ANA) level 05/10/2024   Miscarriage at 8 to [redacted] weeks gestation    Low back pain radiating to lower extremity 05/02/2024   Thoracic back pain 05/02/2024   Fatigue 05/02/2024   Myofascial pain 05/02/2024   Joint pain 05/02/2024   MDD (major depressive disorder), recurrent episode, severe (HCC) 04/05/2024   GAD (generalized anxiety disorder) 04/05/2024   Extrinsic asthma 11/21/2021   Otalgia of both ears 07/03/2020   Benign paroxysmal positional vertigo 07/03/2020   History of melanoma 07/25/2019   Atypical squamous cells of undetermined significance on cytologic smear of cervix (ASC-US ) 06/01/2018   Hydradenitis 10/26/2017   Screen for STD (sexually transmitted disease) 01/23/2015   MRSA (methicillin resistant Staphylococcus aureus) carrier 04/12/2014   INTERSTITIAL CYSTITIS 06/25/2010   Allergic rhinitis 03/22/2007   Personal history of other malignant neoplasm of skin 03/22/2007    ONSET DATE: 08/09/24  REFERRING DIAG: L distal radius fx with ORIF  THERAPY DIAG:  No diagnosis found.  Rationale for Evaluation and Treatment: Rehabilitation  SUBJECTIVE:   SUBJECTIVE STATEMENT: I have been  a bit scattered with the holidays and a recent friend passing away.  Pt accompanied by: self  PERTINENT HISTORY:  ORTHO NOTE 08/22/24 Left Upper Extremity: Incision is clean, dry, intact. No significant erythema, drainage, or other signs of infection. Sutures are in place. Mild ecchymosis about the incision site. Patient is able to almost make a full fist. Patient has very limited wrist flexion and extension. Patient is able to demonstrate almost full pro pronation but has very limited supination. Sensation is intact to light touch throughout. She exhibits some hyperesthesia during the examination. All digits are warm well-perfused.  Imaging and Results: Radiographs of  the left wrist obtained in the clinic today were personally reviewed. The hardware placed remains in appropriate position without evidence of failure, screw backout. The fracture remains appropriately reduced.  Assessment & Plan: KETINA MARS is a 38 y.o. female patient who is s/p left distal radius fracture ORIF - Sutures removed in clinic today - Transitioned to a removable wrist brace - Continue NWB for another 4 wks - OTC pain medications as needed -Referral to occupational therapy placed for range of motion exercises and desensitization training - Follow-up in 4 weeks   PRECAUTIONS: NWB on L UE 4 wks, prefab wrist brace on      WEIGHT BEARING RESTRICTIONS: NBW 4 wks  PAIN:  Are you having pain?  1/10 at the radial wrist with supination  FALLS: Has patient fallen in last 6 months? Yes. 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  [place] and spend time with daughter daughters ages 33, 27 and 65-likes to paint and do crafts  PATIENT GOALS: I want the pain better and get my motion and strength back that I can use my hand  NEXT MD VISIT: 11/08/24  OBJECTIVE:  Note: Objective measures were completed at Evaluation unless otherwise noted.  HAND DOMINANCE: Right  ADLs: Patient is 2 weeks postop still in brace.  Not able to use right hand  FUNCTIONAL OUTCOME MEASURES: PRWHE function 48/50 pain 44/50  UPPER EXTREMITY ROM:     Active ROM Right eval Left eval L 08/29/24 L 09/11/24 L 10/03/24 L  10/24/24  Shoulder flexion        Shoulder abduction        Shoulder adduction        Shoulder extension        Shoulder internal rotation        Shoulder external rotation        Elbow flexion        Elbow extension        Wrist flexion  45 52 50 65 ( 53 loose fist )  65  Wrist extension  30 40 40 50 ( 55 loose fist  50  Wrist ulnar deviation  18 25 30 30  35  Wrist radial deviation  5 12 15 20 30   Wrist  pronation  90 90 90 90   Wrist supination  neutral 40 3/10 pain 70 70 4/10  85  (Blank rows = not tested)  Active ROM Right eval Left eval L   Thumb MCP (0-60) 60    Thumb IP (0-80) 40    Thumb Radial abd/add (0-55) 35   40  Thumb Palmar abd/add (0-45) 40   45  Thumb Opposition to Small Finger Opposition to 5th - pull at thumb   Base of 5th   Index MCP (0-90)  Index PIP (0-100)      Index DIP (0-70)       Long MCP (0-90)       Long PIP (0-100)       Long DIP (0-70)       Ring MCP (0-90)       Ring PIP (0-100)       Ring DIP (0-70)       Little MCP (0-90)       Little PIP (0-100)       Little DIP (0-70)       (Blank rows = not tested)   HAND FUNCTION: 10/03/24 Grip strength: Right: 70 lbs; Left: 46 lbs, Lateral pinch: Right: 18 lbs, Left: 12 lbs, and 3 point pinch: Right: 18 lbs, Left: 13 lbs 10/24/24 Grip strength: Right: 70 lbs; Left: 55 lbs, Lateral pinch: Right: 18 lbs, Left: 14 lbs, and 3 point pinch: Right: 18 lbs, Left: 13 lbs  COORDINATION: Decrease because of increase stiffness and immobilization still  SENSATION: Patient reports some numbness in the tip of thumb but improving  EDEMA: Over wrist and dorsal hand  COGNITION: Overall cognitive status: Within functional limits for tasks assessed     TREATMENT DATE: 10/24/24                                                                                                                            Patient arrived today after not being seen for 3 weeks. Range of motion measurements taken see flowsheet. As well as grip and prehension strength.  See flowsheet. Improving in supination, continues to be limited in wrist flexion extension.  Modalities: Paraffin Time: 8 Location: Left hand and forearm with 2-minute x 2 supination stretch prior to exercises  Upgraded to 2 pound weight 12 reps to 15 reps pain-free with wrist extension placed on hold Issued yellow theraband for supination/pronation resistive  exercises Reviewed towel facilitated supination stretch for home.   Provided patient a new [employer] -[employer] scar pad for nighttime Applied 3 strips of Kinesiotape at 100% stretch in star shape over scar, educated on application and wearing schedule for home. Instructed to remove at nighttime and skip wearing every 3rd day to avoid skin irritation.    PATIENT EDUCATION: Education details: findings of eval and HEP  Person educated: Patient Education method: Explanation, Demonstration, Tactile cues, Verbal cues, and Handouts Education comprehension: verbalized understanding, returned demonstration, verbal cues required, and needs further education   GOALS: Goals reviewed with patient? Yes  LONG TERM GOALS: Target date: 10 wks  Patient to be independent in home program to decrease  pain and hypersensitivity in left hand and pain score for PRWHE improved with more than 20 points Baseline: Patient arrived with 4/10 pain at rest in brace.  Unable to tolerate wrist brace fastened appropriately to support wrist Goal status: INITIAL  2.  Patient to be independent in home program to improve thumb active range of motion in all planes within normal limits  symptom-free Baseline: 4/10 pain arriving at rest.  Limited palmar radial abduction 35 to 40 degrees discomfort with opposition to fifth. Goal status: INITIAL  3.  Patient to be independent in home program to increase wrist active range of motion for flexion, extension and radial ulnar deviation to within functional limits symptom-free to use hand 50% in bathing and dressing Baseline: Wrist flexion 45 extension 30 degrees with ulnar deviation 18 and radial deviation 5 pain at rest coming in 4/10 and no knowledge of home program Goal status: INITIAL  4.  Left supination improved to within functional limits symptom-free for patient be able to turn doorknob Baseline: Supination to neutral.  Patient arrived with pain 4/10 at rest Goal status: INITIAL  5.   Left wrist strength improved to within functional limits for PRWHE function score to improve more than 20 pounds Baseline: PRWHE function score 48/50.  Limited in wrist motion in all planes with pain at rest coming in 4/10; 2 weeks postop Goal status: INITIAL  6.  Left grip and prehension strength improved to within normal range for age to be able to carry groceries and cut food and return to doing crafts Baseline: 2 weeks postop Goal status: INITIAL   ASSESSMENT:  CLINICAL IMPRESSION: Patient seen for occupational therapy for left wrist distal radius fracture with ORIF by Dr. Ezra on 08/09/2024.  Seen after missing 3 weeks of sessions due to holidays, sickness, and death in the family. Improved supination to 85* and L grip strength to 55 lb. Issued yellow theraband for sup/pronation strengthening. Scar adhesions on the volar wrist limiting wrist flexion extension - issued Ktape for scar management with instructions for application and skin checks. Pain improving. Patient made great progress in session.  Patient can benefit from skilled OT services to decrease edema and pain and increase motion and strength to return to prior level of function.  PERFORMANCE DEFICITS: in functional skills including ADLs, IADLs, ROM, strength, pain, flexibility, decreased knowledge of use of DME, and UE functional use,   and psychosocial skills including environmental adaptation and routines and behaviors.   IMPAIRMENTS: are limiting patient from ADLs, IADLs, rest and sleep, play, leisure, and social participation.   COMORBIDITIES: has no other co-morbidities that affects occupational performance. Patient will benefit from skilled OT to address above impairments and improve overall function.  MODIFICATION OR ASSISTANCE TO COMPLETE EVALUATION: No modification of tasks or assist necessary to complete an evaluation.  OT OCCUPATIONAL PROFILE AND HISTORY: Problem focused assessment: Including review of records  relating to presenting problem.  CLINICAL DECISION MAKING: LOW - limited treatment options, no task modification necessary  REHAB POTENTIAL: Good for goals  EVALUATION COMPLEXITY: Low      PLAN:  OT FREQUENCY: 1-2x/week  OT DURATION: 10 weeks  PLANNED INTERVENTIONS: 97168 OT Re-evaluation, 97535 self care/ADL training, 02889 therapeutic exercise, 97530 therapeutic activity, 97112 neuromuscular re-education, 97140 manual therapy, 97018 paraffin, 02960 fluidotherapy, 97034 contrast bath, 97760 Orthotic Initial, [id number] Orthotic/Prosthetic subsequent, scar mobilization, passive range of motion, patient/family education, and DME and/or AE instructions    CONSULTED AND AGREED WITH PLAN OF CARE: Patient     Elston JINNY Slot, OTR/L 10/24/2024, 4:03 PM

## 2024-11-06 NOTE — Therapy (Incomplete)
 OUTPATIENT OCCUPATIONAL THERAPY ORTHO TREATMENT  Patient Name: Pamela Calhoun MRN: [id number] DOB:Oct 17, 1986, 38 y.o., female Today's Date: 11/06/2024  PCP: Dr Randeen REFERRING PROVIDER: Dr Ezra   END OF SESSION:     Past Medical History:  Diagnosis Date   Allergy    Anxiety    Bladder spasms    Cancer (HCC)    skin- hystory of melanoma    Chronic interstitial cystitis    Depression    Endometriosis    Fracture    right foot   Frequent UTI    Generalized anxiety disorder with panic attacks    History of shingles    Interstitial cystitis    Miscarriage at 8 to [redacted] weeks gestation    December 2023   Obsessive-compulsive disorder    Pap smear, abnormal    cryosx    PID (acute pelvic inflammatory disease)    hx of   PTSD (post-traumatic stress disorder)    Ureteral reflux    as a child    Past Surgical History:  Procedure Laterality Date   DILATION AND CURETTAGE OF UTERUS     EXCISION OF SKIN TAG N/A 02/11/2016   Procedure: EXCISION OF ANAL SKIN TAG AN INTERNAL PAPILLA ;  Surgeon: Bernarda Ned, MD;  Location: [facility];  Service: General;  Laterality: N/A;   LAPAROSCOPIC APPENDECTOMY N/A 04/22/2023   Procedure: APPENDECTOMY LAPAROSCOPIC;  Surgeon: Signe Mitzie LABOR, MD;  Location: [facility];  Service: General;  Laterality: N/A;   LAPAROSCOPY     pelvic    MELANOMA EXCISION     Right leg    OPEN REDUCTION INTERNAL FIXATION (ORIF) DISTAL RADIAL FRACTURE Left 08/09/2024   Procedure: OPEN REDUCTION INTERNAL FIXATION (ORIF) DISTAL RADIUS FRACTURE;  Surgeon: Ezra Jackquline RAMAN, MD;  Location: [facility];  Service: Orthopedics;  Laterality: Left;   TONSILLECTOMY     Patient Active Problem List   Diagnosis Date Noted   Elevated antinuclear antibody (ANA) level 05/10/2024   Miscarriage at 8 to [redacted] weeks gestation    Low back pain radiating to lower extremity 05/02/2024   Thoracic back pain 05/02/2024   Fatigue 05/02/2024   Myofascial pain  05/02/2024   Joint pain 05/02/2024   MDD (major depressive disorder), recurrent episode, severe (HCC) 04/05/2024   GAD (generalized anxiety disorder) 04/05/2024   Extrinsic asthma 11/21/2021   Otalgia of both ears 07/03/2020   Benign paroxysmal positional vertigo 07/03/2020   History of melanoma 07/25/2019   Atypical squamous cells of undetermined significance on cytologic smear of cervix (ASC-US ) 06/01/2018   Hydradenitis 10/26/2017   Screen for STD (sexually transmitted disease) 01/23/2015   MRSA (methicillin resistant Staphylococcus aureus) carrier 04/12/2014   INTERSTITIAL CYSTITIS 06/25/2010   Allergic rhinitis 03/22/2007   Personal history of other malignant neoplasm of skin 03/22/2007    ONSET DATE: 08/09/24  REFERRING DIAG: L distal radius fx with ORIF  THERAPY DIAG:  No diagnosis found.  Rationale for Evaluation and Treatment: Rehabilitation  SUBJECTIVE:   SUBJECTIVE STATEMENT: I have been a bit scattered with the holidays and a recent friend passing away.  Pt accompanied by: self  PERTINENT HISTORY:  ORTHO NOTE 08/22/24 Left Upper Extremity: Incision is clean, dry, intact. No significant erythema, drainage, or other signs of infection. Sutures are in place. Mild ecchymosis about the incision site. Patient is able to almost make a full fist. Patient has very limited wrist flexion and extension. Patient is able to demonstrate almost full pro pronation  but has very limited supination. Sensation is intact to light touch throughout. She exhibits some hyperesthesia during the examination. All digits are warm well-perfused.  Imaging and Results: Radiographs of the left wrist obtained in the clinic today were personally reviewed. The hardware placed remains in appropriate position without evidence of failure, screw backout. The fracture remains appropriately reduced.  Assessment & Plan: Pamela Calhoun is a 38 y.o. female patient who is s/p left distal radius fracture ORIF -  Sutures removed in clinic today - Transitioned to a removable wrist brace - Continue NWB for another 4 wks - OTC pain medications as needed -Referral to occupational therapy placed for range of motion exercises and desensitization training - Follow-up in 4 weeks   PRECAUTIONS: NWB on L UE 4 wks, prefab wrist brace on      WEIGHT BEARING RESTRICTIONS: NBW 4 wks  PAIN:  Are you having pain?  1/10 at the radial wrist with supination  FALLS: Has patient fallen in last 6 months? Yes. 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  [place] and spend time with daughter daughters ages 23, 41 and 63-likes to paint and do crafts  PATIENT GOALS: I want the pain better and get my motion and strength back that I can use my hand  NEXT MD VISIT: 11/08/24  OBJECTIVE:  Note: Objective measures were completed at Evaluation unless otherwise noted.  HAND DOMINANCE: Right  ADLs: Patient is 2 weeks postop still in brace.  Not able to use right hand  FUNCTIONAL OUTCOME MEASURES: PRWHE function 48/50 pain 44/50  UPPER EXTREMITY ROM:     Active ROM Right eval Left eval L 08/29/24 L 09/11/24 L 10/03/24 L  10/24/24  Shoulder flexion        Shoulder abduction        Shoulder adduction        Shoulder extension        Shoulder internal rotation        Shoulder external rotation        Elbow flexion        Elbow extension        Wrist flexion  45 52 50 65 ( 53 loose fist )  65  Wrist extension  30 40 40 50 ( 55 loose fist  50  Wrist ulnar deviation  18 25 30 30  35  Wrist radial deviation  5 12 15 20 30   Wrist pronation  90 90 90 90   Wrist supination  neutral 40 3/10 pain 70 70 4/10  85  (Blank rows = not tested)  Active ROM Right eval Left eval L   Thumb MCP (0-60) 60    Thumb IP (0-80) 40    Thumb Radial abd/add (0-55) 35   40  Thumb Palmar abd/add (0-45) 40   45  Thumb Opposition to Small Finger Opposition to 5th -  pull at thumb   Base of 5th   Index MCP (0-90)      Index PIP (0-100)      Index DIP (0-70)       Long MCP (0-90)       Long PIP (0-100)       Long DIP (0-70)       Ring MCP (0-90)       Ring PIP (0-100)       Ring DIP (0-70)       Little MCP (0-90)  Little PIP (0-100)       Little DIP (0-70)       (Blank rows = not tested)   HAND FUNCTION: 10/03/24 Grip strength: Right: 70 lbs; Left: 46 lbs, Lateral pinch: Right: 18 lbs, Left: 12 lbs, and 3 point pinch: Right: 18 lbs, Left: 13 lbs 10/24/24 Grip strength: Right: 70 lbs; Left: 55 lbs, Lateral pinch: Right: 18 lbs, Left: 14 lbs, and 3 point pinch: Right: 18 lbs, Left: 13 lbs  COORDINATION: Decrease because of increase stiffness and immobilization still  SENSATION: Patient reports some numbness in the tip of thumb but improving  EDEMA: Over wrist and dorsal hand  COGNITION: Overall cognitive status: Within functional limits for tasks assessed     TREATMENT DATE: 11/06/24                                                                                                                            Patient arrived today after not being seen for 3 weeks. Range of motion measurements taken see flowsheet. As well as grip and prehension strength.  See flowsheet. Improving in supination, continues to be limited in wrist flexion extension.  Modalities: Paraffin Time: 8 Location: Left hand and forearm with 2-minute x 2 supination stretch prior to exercises  Upgraded to 2 pound weight 12 reps to 15 reps pain-free with wrist extension placed on hold Issued yellow theraband for supination/pronation resistive exercises Reviewed towel facilitated supination stretch for home.   Provided patient a new [employer] -Care scar pad for nighttime Applied 3 strips of Kinesiotape at 100% stretch in star shape over scar, educated on application and wearing schedule for home. Instructed to remove at nighttime and skip wearing every 3rd day to avoid  skin irritation.    PATIENT EDUCATION: Education details: findings of eval and HEP  Person educated: Patient Education method: Explanation, Demonstration, Tactile cues, Verbal cues, and Handouts Education comprehension: verbalized understanding, returned demonstration, verbal cues required, and needs further education   GOALS: Goals reviewed with patient? Yes  LONG TERM GOALS: Target date: 10 wks  Patient to be independent in home program to decrease  pain and hypersensitivity in left hand and pain score for PRWHE improved with more than 20 points Baseline: Patient arrived with 4/10 pain at rest in brace.  Unable to tolerate wrist brace fastened appropriately to support wrist Goal status: INITIAL  2.  Patient to be independent in home program to improve thumb active range of motion in all planes within normal limits symptom-free Baseline: 4/10 pain arriving at rest.  Limited palmar radial abduction 35 to 40 degrees discomfort with opposition to fifth. Goal status: INITIAL  3.  Patient to be independent in home program to increase wrist active range of motion for flexion, extension and radial ulnar deviation to within functional limits symptom-free to use hand 50% in bathing and dressing Baseline: Wrist flexion 45 extension 30 degrees with ulnar deviation 18 and radial deviation 5 pain at rest coming  in 4/10 and no knowledge of home program Goal status: INITIAL  4.  Left supination improved to within functional limits symptom-free for patient be able to turn doorknob Baseline: Supination to neutral.  Patient arrived with pain 4/10 at rest Goal status: INITIAL  5.  Left wrist strength improved to within functional limits for PRWHE function score to improve more than 20 pounds Baseline: PRWHE function score 48/50.  Limited in wrist motion in all planes with pain at rest coming in 4/10; 2 weeks postop Goal status: INITIAL  6.  Left grip and prehension strength improved to within normal  range for age to be able to carry groceries and cut food and return to doing crafts Baseline: 2 weeks postop Goal status: INITIAL   ASSESSMENT:  CLINICAL IMPRESSION: ***Patient seen for occupational therapy for left wrist distal radius fracture with ORIF by Dr. Ezra on 08/09/2024.  Seen after missing 3 weeks of sessions due to holidays, sickness, and death in the family. Improved supination to 85* and L grip strength to 55 lb. Issued yellow theraband for sup/pronation strengthening. Scar adhesions on the volar wrist limiting wrist flexion extension - issued Ktape for scar management with instructions for application and skin checks. Pain improving. Patient made great progress in session.  Patient can benefit from skilled OT services to decrease edema and pain and increase motion and strength to return to prior level of function.  PERFORMANCE DEFICITS: in functional skills including ADLs, IADLs, ROM, strength, pain, flexibility, decreased knowledge of use of DME, and UE functional use,   and psychosocial skills including environmental adaptation and routines and behaviors.   IMPAIRMENTS: are limiting patient from ADLs, IADLs, rest and sleep, play, leisure, and social participation.   COMORBIDITIES: has no other co-morbidities that affects occupational performance. Patient will benefit from skilled OT to address above impairments and improve overall function.  MODIFICATION OR ASSISTANCE TO COMPLETE EVALUATION: No modification of tasks or assist necessary to complete an evaluation.  OT OCCUPATIONAL PROFILE AND HISTORY: Problem focused assessment: Including review of records relating to presenting problem.  CLINICAL DECISION MAKING: LOW - limited treatment options, no task modification necessary  REHAB POTENTIAL: Good for goals  EVALUATION COMPLEXITY: Low      PLAN:  OT FREQUENCY: 1-2x/week  OT DURATION: 10 weeks  PLANNED INTERVENTIONS: 97168 OT Re-evaluation, 97535 self care/ADL  training, 02889 therapeutic exercise, 97530 therapeutic activity, 97112 neuromuscular re-education, 97140 manual therapy, 97018 paraffin, 02960 fluidotherapy, 97034 contrast bath, 97760 Orthotic Initial, [id number] Orthotic/Prosthetic subsequent, scar mobilization, passive range of motion, patient/family education, and DME and/or AE instructions    CONSULTED AND AGREED WITH PLAN OF CARE: Patient     Elston JINNY Slot, OTR/L 11/06/2024, 3:14 PM

## 2024-11-07 ENCOUNTER — Ambulatory Visit: Payer: MEDICAID | Admitting: Occupational Therapy

## 2024-11-09 ENCOUNTER — Telehealth (HOSPITAL_COMMUNITY): Payer: MEDICAID | Admitting: Psychiatry

## 2024-11-09 ENCOUNTER — Ambulatory Visit: Payer: MEDICAID

## 2024-11-09 DIAGNOSIS — F4381 Prolonged grief disorder: Secondary | ICD-10-CM

## 2024-11-09 DIAGNOSIS — F12220 Cannabis dependence with intoxication, uncomplicated: Secondary | ICD-10-CM

## 2024-11-09 DIAGNOSIS — F4323 Adjustment disorder with mixed anxiety and depressed mood: Secondary | ICD-10-CM | POA: Insufficient documentation

## 2024-11-09 DIAGNOSIS — F1424 Cocaine dependence with cocaine-induced mood disorder: Secondary | ICD-10-CM

## 2024-11-09 DIAGNOSIS — F10220 Alcohol dependence with intoxication, uncomplicated: Secondary | ICD-10-CM

## 2024-11-09 DIAGNOSIS — F1990 Other psychoactive substance use, unspecified, uncomplicated: Secondary | ICD-10-CM | POA: Insufficient documentation

## 2024-11-09 DIAGNOSIS — F4310 Post-traumatic stress disorder, unspecified: Secondary | ICD-10-CM | POA: Diagnosis not present

## 2024-11-09 DIAGNOSIS — F1910 Other psychoactive substance abuse, uncomplicated: Secondary | ICD-10-CM
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
## 2024-11-24 ENCOUNTER — Ambulatory Visit: Payer: MEDICAID

## 2024-11-27 ENCOUNTER — Ambulatory Visit: Payer: MEDICAID

## 2024-11-27 DIAGNOSIS — Z029 Encounter for administrative examinations, unspecified: Secondary | ICD-10-CM

## 2024-11-27 NOTE — Progress Notes (Signed)
"   Error Pt. No show. Patient no-showed today's appointment; She called front dest to indicate she was unwell. She will be seen next week. "

## 2024-12-01 ENCOUNTER — Ambulatory Visit: Payer: MEDICAID

## 2024-12-04 ENCOUNTER — Ambulatory Visit (INDEPENDENT_AMBULATORY_CARE_PROVIDER_SITE_OTHER): Payer: MEDICAID

## 2024-12-04 DIAGNOSIS — F4310 Post-traumatic stress disorder, unspecified: Secondary | ICD-10-CM

## 2024-12-04 DIAGNOSIS — F1424 Cocaine dependence with cocaine-induced mood disorder: Secondary | ICD-10-CM | POA: Insufficient documentation

## 2024-12-04 DIAGNOSIS — F10220 Alcohol dependence with intoxication, uncomplicated: Secondary | ICD-10-CM | POA: Insufficient documentation

## 2024-12-04 DIAGNOSIS — F12220 Cannabis dependence with intoxication, uncomplicated: Secondary | ICD-10-CM | POA: Insufficient documentation
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
## 2024-12-06 ENCOUNTER — Ambulatory Visit: Payer: MEDICAID | Admitting: Family Medicine

## 2024-12-06 ENCOUNTER — Encounter: Payer: Self-pay | Admitting: Family Medicine

## 2024-12-06 VITALS — BP 110/66 | HR 96 | Temp 97.8°F | Ht 70.0 in | Wt 185.5 lb

## 2024-12-06 DIAGNOSIS — N3010 Interstitial cystitis (chronic) without hematuria: Secondary | ICD-10-CM | POA: Diagnosis not present

## 2024-12-06 DIAGNOSIS — R300 Dysuria: Secondary | ICD-10-CM | POA: Diagnosis not present

## 2024-12-06 DIAGNOSIS — F10220 Alcohol dependence with intoxication, uncomplicated: Secondary | ICD-10-CM | POA: Diagnosis not present

## 2024-12-06 DIAGNOSIS — M7918 Myalgia, other site: Secondary | ICD-10-CM | POA: Diagnosis not present

## 2024-12-06 DIAGNOSIS — H6990 Unspecified Eustachian tube disorder, unspecified ear: Secondary | ICD-10-CM | POA: Insufficient documentation

## 2024-12-06 DIAGNOSIS — H6992 Unspecified Eustachian tube disorder, left ear: Secondary | ICD-10-CM | POA: Diagnosis not present

## 2024-12-06 DIAGNOSIS — Z113 Encounter for screening for infections with a predominantly sexual mode of transmission: Secondary | ICD-10-CM | POA: Insufficient documentation

## 2024-12-06 DIAGNOSIS — F4323 Adjustment disorder with mixed anxiety and depressed mood: Secondary | ICD-10-CM

## 2024-12-06 DIAGNOSIS — N898 Other specified noninflammatory disorders of vagina: Secondary | ICD-10-CM

## 2024-12-06 LAB — POC URINALSYSI DIPSTICK (AUTOMATED)
Bilirubin, UA: NEGATIVE
Blood, UA: NEGATIVE
Glucose, UA: NEGATIVE
Ketones, UA: NEGATIVE
Leukocytes, UA: NEGATIVE
Nitrite, UA: NEGATIVE
Protein, UA: NEGATIVE
Spec Grav, UA: 1.025
Urobilinogen, UA: 0.2 U/dL
pH, UA: 6

## 2024-12-06 LAB — POCT WET PREP (WET MOUNT)
Clue Cells Wet Prep Whiff POC: NEGATIVE
Trichomonas Wet Prep HPF POC: ABSENT

## 2024-12-06 MED ORDER — FLUCONAZOLE 150 MG PO TABS: 150.0000 mg | ORAL_TABLET | Freq: Once | ORAL | 0 refills | Status: AC

## 2024-12-06 NOTE — Assessment & Plan Note (Signed)
 Clear urinalysis  Yeast vaginitis on wet prep-will treat  Sti screening pending  Also may be fro mher IC

## 2024-12-06 NOTE — Assessment & Plan Note (Signed)
 Pt is working on this with counselor and psychiatrist  Thinks she is making progress

## 2024-12-06 NOTE — Progress Notes (Signed)
 "  Subjective:    Patient ID: Pamela Calhoun, female    DOB: Aug 17, 1986, 39 y.o.   MRN: [id number]  HPI  Wt Readings from Last 3 Encounters:  12/06/24 185 lb 8 oz (84.1 kg)  08/09/24 167 lb 8 oz (76 kg)  08/03/24 158 lb (71.7 kg)   26.62 kg/m  Vitals:   12/06/24 1535  BP: 110/66  Pulse: 96  Temp: 97.8 F (36.6 C)  SpO2: 99%     Pt presents for  Fibromyalgia  Dizziness- with position change Some left ear pain  Dysuria  STI screening   Dysuria  10 days  Worse than her IC  Using uribrel  Some nausea  Some bladder pain  No fever  No blood in urine   Results for orders placed or performed in visit on 12/06/24  POCT Urinalysis Dipstick (Automated)   Collection Time: 12/06/24  3:52 PM  Result Value Ref Range   Color, UA Green    Clarity, UA Clear    Glucose, UA Negative Negative   Bilirubin, UA Negative    Ketones, UA Negative    Spec Grav, UA 1.025 1.010 - 1.025   Blood, UA Negative    pH, UA 6.0 5.0 - 8.0   Protein, UA Negative Negative   Urobilinogen, UA 0.2 0.2 or 1.0 E.U./dL   Nitrite, UA Negative    Leukocytes, UA Negative Negative  POCT Wet Prep [facility])   Collection Time: 12/06/24  4:06 PM  Result Value Ref Range   Source Wet Prep POC vaginal    WBC, Wet Prep HPF POC mod    Bacteria Wet Prep HPF POC Moderate (A) Few   Bacteria Wet Prep Morphology POC     Clue Cells Wet Prep HPF POC None None   Clue Cells Wet Prep Whiff POC Negative Whiff    Yeast Wet Prep HPF POC Few (A) None   KOH Wet Prep POC Few (A) None   Trichomonas Wet Prep HPF POC Absent Absent     Wants screening for STD  Known exp to trich   Some discharge 2 wk  Some vaginal itching  - 1 week  Using protection   Is on OC - long cycle (not on med list)     Sees rheumatology for fibromyalgia  Had a radial fracture that required ORIF in sept  Baclofen is on med list 5 mg tid  Advil  is on med list 800 mg tid prn      Mental health  Under psychiatric care  Adj  disorder with anx and depression  MDD recurrent  Also GAD and PTSD   Abilify  2 mg daily  Wellbutrin  xl 300 mg daily  Klonopin  0.5 mg tid prn Hydroxyzine  25 mg prn   Seeing therapist    Worsened after a miscarriage and loss of her mother   Alcohol - drinks on and off  3 days per week (her mental health team is aware)   Recreational drugs -none    Left ear pain  1 week Is always congested  Is using flonase    Patient Active Problem List   Diagnosis Date Noted   Screen for STD (sexually transmitted disease) 12/06/2024   ETD (eustachian tube dysfunction) 12/06/2024   Cocaine dependence with cocaine-induced mood disorder (HCC) 12/04/2024   Alcohol dependence with uncomplicated intoxication (HCC) 12/04/2024   Cannabis intoxication without perceptual disturbances with moderate or severe use disorder, uncomplicated (HCC) 12/04/2024   PTSD (post-traumatic stress disorder) 11/09/2024  Substance use disorder 11/09/2024   Adjustment disorder with mixed anxiety and depressed mood 11/09/2024   Elevated antinuclear antibody (ANA) level 05/10/2024   Miscarriage at 8 to [redacted] weeks gestation    Low back pain radiating to lower extremity 05/02/2024   Thoracic back pain 05/02/2024   Fatigue 05/02/2024   Myofascial pain 05/02/2024   Joint pain 05/02/2024   MDD (major depressive disorder), recurrent episode, severe (HCC) 04/05/2024   GAD (generalized anxiety disorder) 04/05/2024   Extrinsic asthma 11/21/2021   Otalgia of both ears 07/03/2020   Benign paroxysmal positional vertigo 07/03/2020   History of melanoma 07/25/2019   Atypical squamous cells of undetermined significance on cytologic smear of cervix (ASC-US ) 06/01/2018   Hydradenitis 10/26/2017   Vaginal discharge 01/23/2015   Dysuria 01/23/2015   Encounters for administrative purpose 01/23/2015   MRSA (methicillin resistant Staphylococcus aureus) carrier 04/12/2014   INTERSTITIAL CYSTITIS 06/25/2010   Allergic rhinitis  03/22/2007   Personal history of other malignant neoplasm of skin 03/22/2007   Past Medical History:  Diagnosis Date   Allergy    Anxiety    Bladder spasms    Cancer (HCC)    skin- hystory of melanoma    Chronic interstitial cystitis    Depression    Endometriosis    Fracture    right foot   Frequent UTI    Generalized anxiety disorder with panic attacks    History of shingles    Interstitial cystitis    Miscarriage at 8 to [redacted] weeks gestation    December 2023   Obsessive-compulsive disorder    Pap smear, abnormal    cryosx    PID (acute pelvic inflammatory disease)    hx of   PTSD (post-traumatic stress disorder)    Ureteral reflux    as a child    Past Surgical History:  Procedure Laterality Date   DILATION AND CURETTAGE OF UTERUS     EXCISION OF SKIN TAG N/A 02/11/2016   Procedure: EXCISION OF ANAL SKIN TAG AN INTERNAL PAPILLA ;  Surgeon: Bernarda Ned, MD;  Location: [facility];  Service: General;  Laterality: N/A;   LAPAROSCOPIC APPENDECTOMY N/A 04/22/2023   Procedure: APPENDECTOMY LAPAROSCOPIC;  Surgeon: Signe Mitzie LABOR, MD;  Location: [facility];  Service: General;  Laterality: N/A;   LAPAROSCOPY     pelvic    MELANOMA EXCISION     Right leg    OPEN REDUCTION INTERNAL FIXATION (ORIF) DISTAL RADIAL FRACTURE Left 08/09/2024   Procedure: OPEN REDUCTION INTERNAL FIXATION (ORIF) DISTAL RADIUS FRACTURE;  Surgeon: Ezra Jackquline RAMAN, MD;  Location: [facility];  Service: Orthopedics;  Laterality: Left;   TONSILLECTOMY     Social History[1] Family History  Problem Relation Age of Onset   Bipolar disorder Mother    Diabetes Mother    Hypertension Mother    Neuropathy Mother        diabetic   Depression Mother    Asthma Father    Cancer Maternal Grandmother        breast   Bipolar disorder Paternal Grandmother    Dementia Paternal Grandmother    Allergies[2] Medications Ordered Prior to Encounter[3]  Review of Systems  Constitutional:   Positive for fatigue. Negative for fever.  HENT:  Positive for congestion and ear pain. Negative for ear discharge.   Eyes:  Negative for discharge.  Respiratory:  Negative for cough and shortness of breath.   Cardiovascular:  Negative for chest pain.  Gastrointestinal:  Negative for abdominal  distention.  Genitourinary:  Positive for dysuria, urgency and vaginal discharge. Negative for difficulty urinating, frequency, hematuria, menstrual problem, pelvic pain and vaginal bleeding.  Musculoskeletal:  Positive for arthralgias and myalgias. Negative for joint swelling.  Neurological:  Positive for dizziness. Negative for syncope, facial asymmetry and headaches.  Psychiatric/Behavioral:  Positive for dysphoric mood and sleep disturbance. The patient is nervous/anxious.        Objective:   Physical Exam Exam conducted with a chaperone present.  Constitutional:      General: She is not in acute distress.    Appearance: Normal appearance. She is well-developed. She is not ill-appearing or diaphoretic.  HENT:     Head: Normocephalic and atraumatic.     Right Ear: Tympanic membrane, ear canal and external ear normal. There is no impacted cerumen.     Left Ear: Tympanic membrane, ear canal and external ear normal. There is no impacted cerumen.     Nose: Congestion present.     Mouth/Throat:     Mouth: Mucous membranes are moist.     Pharynx: Oropharynx is clear.  Eyes:     Extraocular Movements: Extraocular movements intact.     Conjunctiva/sclera: Conjunctivae normal.     Pupils: Pupils are equal, round, and reactive to light.  Neck:     Thyroid: No thyromegaly.     Vascular: No carotid bruit or JVD.  Cardiovascular:     Rate and Rhythm: Regular rhythm. Tachycardia present.     Heart sounds: Normal heart sounds.     No gallop.  Pulmonary:     Effort: Pulmonary effort is normal. No respiratory distress.     Breath sounds: Normal breath sounds. No stridor. No wheezing, rhonchi or rales.   Abdominal:     General: There is no distension or abdominal bruit.     Palpations: Abdomen is soft.     Tenderness: There is no abdominal tenderness.     Hernia: There is no hernia in the left inguinal area or right inguinal area.  Genitourinary:    Exam position: Supine.     Pubic Area: No rash.      Labia:        Right: No rash, tenderness, lesion or injury.        Left: No rash, tenderness, lesion or injury.      Urethra: No prolapse or urethral swelling.  Musculoskeletal:     Cervical back: Normal range of motion and neck supple. No tenderness.     Right lower leg: No edema.     Left lower leg: No edema.  Lymphadenopathy:     Cervical: No cervical adenopathy.     Lower Body: No right inguinal adenopathy. No left inguinal adenopathy.  Skin:    General: Skin is warm and dry.     Coloration: Skin is not pale.     Findings: No rash.  Neurological:     Mental Status: She is alert.     Cranial Nerves: No cranial nerve deficit.     Coordination: Coordination normal.     Deep Tendon Reflexes: Reflexes are normal and symmetric. Reflexes normal.  Psychiatric:        Mood and Affect: Mood normal.           Assessment & Plan:   Problem List Items Addressed This Visit       Nervous and Auditory   ETD (eustachian tube dysfunction)   In the setting of nasal congestion  Ear pressure/worse on left  Some  positional dizziness  Will try increase flonase  to 2 sp per nostril bid for a week-then back to daily  Update if not starting to improve in a week or if worsening  Call back and Er precautions noted in detail today          Genitourinary   INTERSTITIAL CYSTITIS   May be cause of current dysuria  Noted this can track with myofascial pain disorder         Other   Vaginal discharge   Some yeast hyphae on wet prep  Treat with diflucan  150 mg times one   STI treatment panel pending   Update if not starting to improve in a week or if worsening  Call back and Er  precautions noted in detail today        Relevant Orders   POCT Wet Prep ([facility]) (Completed)   C. trachomatis/N. gonorrhoeae RNA   Screen for STD (sexually transmitted disease)   Wet prep-no trich noted   Std panel ordered /pending results  Discussed safe sexual practices       Relevant Orders   C. trachomatis/N. gonorrhoeae RNA   Hepatitis C antibody   HIV Antibody (routine testing w rflx)   RPR W/RFLX TO RPR TITER, TREPONEMAL AB, SCREEN AND DIAGNOSIS   Myofascial pain   Was dx with fibromyalgia by rheumatology  Myofasical/muscular and joint pain  Also increased IC symptoms   Discussed this in detail  Pt is knowledgeable about condition  Recommend trial of water exercise- she is open to this   Discussed use of snri like cymbalta Also gabapentin or lyrica  Pt will d/w her psychiatric med provider         Dysuria - Primary   Clear urinalysis  Yeast vaginitis on wet prep-will treat  Sti screening pending  Also may be fro mher IC       Relevant Orders   POCT Urinalysis Dipstick (Automated) (Completed)   Alcohol dependence with uncomplicated intoxication (HCC)   Pt is working on this with counselor and psychiatrist  Thinks she is making progress       Adjustment disorder with mixed anxiety and depressed mood   Continues psychiatric care / medication and counseling  Taking  Abilify  2 mg daily  Wellbutrin  xl 300 mg daily  Klonopin  0.5 mg tid prn Hydroxyzine  25 mg prn   Discussed option of snri/cymbalta for myofascial pain disorder Also gabapentin or lyrica  Pt will d/w her mental health prescriber  Also noted alcohol intake and possible risk with medications          [1]  Social History Tobacco Use   Smoking status: Former    Current packs/day: 0.00    Types: Cigarettes    Quit date: 02/04/2012    Years since quitting: 12.8   Smokeless tobacco: Never  Vaping Use   Vaping status: Never Used  Substance Use Topics   Alcohol use: Yes     Alcohol/week: 1.0 standard drink of alcohol    Types: 1 Standard drinks or equivalent per week    Comment: every other day maybe 1 beer   Drug use: Not Currently  [2] No Known Allergies [3]  Current Outpatient Medications on File Prior to Visit  Medication Sig Dispense Refill   albuterol  (VENTOLIN  HFA) 108 (90 Base) MCG/ACT inhaler Inhale 2 puffs into the lungs every 4 (four) hours as needed for wheezing. 18 g 0   ARIPiprazole  (ABILIFY ) 2 MG tablet Take 1 tablet (2 mg  total) by mouth daily. 30 tablet 2   baclofen (LIORESAL) 10 MG tablet Take 5 mg by mouth 3 (three) times daily.     buPROPion  (WELLBUTRIN  XL) 300 MG 24 hr tablet Take 1 tablet (300 mg total) by mouth daily. 30 tablet 2   clonazePAM  (KLONOPIN ) 0.5 MG tablet Take 1 tablet (0.5 mg total) by mouth 3 (three) times daily as needed for anxiety. 90 tablet 2   ELMIRON 100 MG capsule Take 2 tablets by mouth at bedtime.   11   fluticasone  (FLONASE ) 50 MCG/ACT nasal spray Place 2 sprays into both nostrils daily.     hydrOXYzine  (ATARAX ) 25 MG tablet Take 1 tablet (25 mg total) by mouth every 6 (six) hours as needed. 60 tablet 2   ibuprofen  (ADVIL ) 800 MG tablet Take 1 tablet (800 mg total) by mouth every 8 (eight) hours as needed. 30 tablet 0   Meth-Hyo-M Bl-Benz Acd-Ph Sal (URIBEL PO) Take by mouth.     traZODone  (DESYREL ) 100 MG tablet Take 1 tablet (100 mg total) by mouth at bedtime. 30 tablet 2   No current facility-administered medications on file prior to visit.   "

## 2024-12-06 NOTE — Assessment & Plan Note (Signed)
 Some yeast hyphae on wet prep  Treat with diflucan  150 mg times one   STI treatment panel pending   Update if not starting to improve in a week or if worsening  Call back and Er precautions noted in detail today

## 2024-12-06 NOTE — Assessment & Plan Note (Signed)
 Was dx with fibromyalgia by rheumatology  Myofasical/muscular and joint pain  Also increased IC symptoms   Discussed this in detail  Pt is knowledgeable about condition  Recommend trial of water exercise- she is open to this   Discussed use of snri like cymbalta Also gabapentin or lyrica  Pt will d/w her psychiatric med provider

## 2024-12-06 NOTE — Assessment & Plan Note (Signed)
 Wet prep-no trich noted   Std panel ordered /pending results  Discussed safe sexual practices

## 2024-12-06 NOTE — Assessment & Plan Note (Signed)
 May be cause of current dysuria  Noted this can track with myofascial pain disorder

## 2024-12-06 NOTE — Assessment & Plan Note (Signed)
 Continues psychiatric care / medication and counseling  Taking  Abilify  2 mg daily  Wellbutrin  xl 300 mg daily  Klonopin  0.5 mg tid prn Hydroxyzine  25 mg prn   Discussed option of snri/cymbalta for myofascial pain disorder Also gabapentin or lyrica  Pt will d/w her mental health prescriber  Also noted alcohol intake and possible risk with medications

## 2024-12-06 NOTE — Patient Instructions (Addendum)
 When you see psychiatry discuss   Cymbalta (antidepressant that helps pain and mood)   Also - pain  Lyrica  Gabapentin   Water exercise helps the most    Urine is clear   Gc/chlamydia and other std screen pending   Come yeast on wet prep  Take diflucan  for that   Increase your flonase  to  2 sprays each nostril twice daily - 1 week Then go back to daily   Update if not starting to improve in a week or if worsening

## 2024-12-06 NOTE — Assessment & Plan Note (Signed)
 In the setting of nasal congestion  Ear pressure/worse on left  Some positional dizziness  Will try increase flonase  to 2 sp per nostril bid for a week-then back to daily  Update if not starting to improve in a week or if worsening  Call back and Er precautions noted in detail today

## 2024-12-07 ENCOUNTER — Ambulatory Visit: Payer: Self-pay | Admitting: Family Medicine

## 2024-12-07 LAB — HIV ANTIBODY (ROUTINE TESTING W REFLEX)
HIV 1&2 Ab, 4th Generation: NONREACTIVE
HIV FINAL INTERPRETATION: NEGATIVE

## 2024-12-07 LAB — SYPHILIS: RPR W/REFLEX TO RPR TITER AND TREPONEMAL ANTIBODIES, TRADITIONAL SCREENING AND DIAGNOSIS ALGORITHM: RPR Ser Ql: NONREACTIVE

## 2024-12-07 LAB — C. TRACHOMATIS/N. GONORRHOEAE RNA
C. trachomatis RNA, TMA: NOT DETECTED
N. gonorrhoeae RNA, TMA: NOT DETECTED

## 2024-12-07 LAB — HEPATITIS C ANTIBODY: Hepatitis C Ab: NONREACTIVE
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
## 2024-12-08 ENCOUNTER — Ambulatory Visit: Payer: MEDICAID

## 2024-12-11 ENCOUNTER — Ambulatory Visit (INDEPENDENT_AMBULATORY_CARE_PROVIDER_SITE_OTHER): Payer: MEDICAID

## 2024-12-11 DIAGNOSIS — F10220 Alcohol dependence with intoxication, uncomplicated: Secondary | ICD-10-CM

## 2024-12-11 DIAGNOSIS — F1424 Cocaine dependence with cocaine-induced mood disorder: Secondary | ICD-10-CM

## 2024-12-11 DIAGNOSIS — F4310 Post-traumatic stress disorder, unspecified: Secondary | ICD-10-CM

## 2024-12-11 DIAGNOSIS — F12220 Cannabis dependence with intoxication, uncomplicated: Secondary | ICD-10-CM

## 2024-12-12 ENCOUNTER — Telehealth (HOSPITAL_COMMUNITY): Payer: MEDICAID | Admitting: Psychiatry

## 2024-12-12 ENCOUNTER — Encounter (HOSPITAL_COMMUNITY): Payer: Self-pay | Admitting: Psychiatry

## 2024-12-12 DIAGNOSIS — F411 Generalized anxiety disorder: Secondary | ICD-10-CM | POA: Diagnosis not present

## 2024-12-12 DIAGNOSIS — F332 Major depressive disorder, recurrent severe without psychotic features: Secondary | ICD-10-CM | POA: Diagnosis not present

## 2024-12-12 DIAGNOSIS — F4310 Post-traumatic stress disorder, unspecified: Secondary | ICD-10-CM

## 2024-12-12 MED ORDER — DULOXETINE HCL 60 MG PO CPEP: 60.0000 mg | ORAL_CAPSULE | Freq: Every day | ORAL | 3 refills | Status: AC

## 2024-12-12 MED ORDER — TRAZODONE HCL 100 MG PO TABS: 100.0000 mg | ORAL_TABLET | Freq: Every day | ORAL | 2 refills | Status: AC

## 2024-12-12 MED ORDER — BUPROPION HCL ER (XL) 300 MG PO TB24: 300.0000 mg | ORAL_TABLET | Freq: Every day | ORAL | 2 refills | Status: AC

## 2024-12-12 MED ORDER — HYDROXYZINE HCL 25 MG PO TABS: 25.0000 mg | ORAL_TABLET | Freq: Four times a day (QID) | ORAL | 2 refills | Status: AC | PRN

## 2024-12-12 MED ORDER — CLONAZEPAM 0.5 MG PO TABS: 0.5000 mg | ORAL_TABLET | Freq: Three times a day (TID) | ORAL | 2 refills | Status: AC | PRN

## 2024-12-12 NOTE — Progress Notes (Signed)
 Virtual Visit via Telephone Note  I connected with Delon LITTIE Presser on 12/12/24 at 10:00 AM EST by telephone and verified that I am speaking with the correct person using two identifiers.  Location: Patient: home Provider: office   I discussed the limitations, risks, security and privacy concerns of performing an evaluation and management service by telephone and the availability of in person appointments. I also discussed with the patient that there may be a patient responsible charge related to this service. The patient expressed understanding and agreed to proceed.      I discussed the assessment and treatment plan with the patient. The patient was provided an opportunity to ask questions and all were answered. The patient agreed with the plan and demonstrated an understanding of the instructions.   The patient was advised to call back or seek an in-person evaluation if the symptoms worsen or if the condition fails to improve as anticipated.  I provided 20 minutes of non-face-to-face time during this encounter.   Barnie Gull, MD  [facility] MD/PA/NP OP Progress Note  12/12/2024 10:13 AM Delon LITTIE Presser  MRN:  [id number]  Chief Complaint:  Chief Complaint  Patient presents with   Depression   Anxiety   Follow-up   HPI: This patient is a 39 year old single white female who lives with her father in [place]. 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 She is currently unemployed.   The pain returns for follow-up after 2 months regarding her major depression and generalized anxiety disorder.  Last time she was more depressed had trouble getting going and was complaining of constant body pain.  She has seen rheumatology and has been diagnosed with fibromyalgia.  They suggested Cymbalta  and I am fine with adding this to her regimen.  She would like to stop the Abilify  because she has gained 15 pounds on it and I think this is reasonable.  She  does think the Wellbutrin  has helped her depression so I do not really want to stop it at this point.  Last time we added trazodone  at a higher dosage and she is sleeping better.  She is also working with a therapist in the [facility] and is getting more structure in her life as well as some mild exercise and she is starting to feel better.  She is no longer having any thoughts of self-harm.  The medicines for anxiety continue to help especially when she needs to leave the house. Visit Diagnosis:    ICD-10-CM   1. PTSD (post-traumatic stress disorder)  F43.10     2. Severe episode of recurrent major depressive disorder, without psychotic features (HCC)  F33.2     3. Generalized anxiety disorder  F41.1       Past Psychiatric History: none  Past Medical History:  Past Medical History:  Diagnosis Date   Allergy    Anxiety    Bladder spasms    Cancer (HCC)    skin- hystory of melanoma    Chronic interstitial cystitis    Depression    Endometriosis    Fracture    right foot   Frequent UTI    Generalized anxiety disorder with panic attacks    History of shingles    Interstitial cystitis    Miscarriage at 8 to [redacted] weeks gestation    December 2023   Obsessive-compulsive disorder    Pap smear, abnormal    cryosx    PID (acute  pelvic inflammatory disease)    hx of   PTSD (post-traumatic stress disorder)    Ureteral reflux    as a child     Past Surgical History:  Procedure Laterality Date   DILATION AND CURETTAGE OF UTERUS     EXCISION OF SKIN TAG N/A 02/11/2016   Procedure: EXCISION OF ANAL SKIN TAG AN INTERNAL PAPILLA ;  Surgeon: Bernarda Ned, MD;  Location: [facility];  Service: General;  Laterality: N/A;   LAPAROSCOPIC APPENDECTOMY N/A 04/22/2023   Procedure: APPENDECTOMY LAPAROSCOPIC;  Surgeon: Signe Mitzie LABOR, MD;  Location: [facility] OR;  Service: General;  Laterality: N/A;   LAPAROSCOPY     pelvic    MELANOMA EXCISION     Right leg    OPEN REDUCTION  INTERNAL FIXATION (ORIF) DISTAL RADIAL FRACTURE Left 08/09/2024   Procedure: OPEN REDUCTION INTERNAL FIXATION (ORIF) DISTAL RADIUS FRACTURE;  Surgeon: Ezra Jackquline RAMAN, MD;  Location: [facility];  Service: Orthopedics;  Laterality: Left;   TONSILLECTOMY      Family Psychiatric History: See below  Family History:  Family History  Problem Relation Age of Onset   Bipolar disorder Mother    Diabetes Mother    Hypertension Mother    Neuropathy Mother        diabetic   Depression Mother    Asthma Father    Cancer Maternal Grandmother        breast   Bipolar disorder Paternal Grandmother    Dementia Paternal Grandmother     Social History:  Social History   Socioeconomic History   Marital status: Single    Spouse name: Not on file   Number of children: 0   Years of education: Not on file   Highest education level: Bachelor's degree (e.g., BA, AB, BS)  Occupational History   Not on file  Tobacco Use   Smoking status: Former    Current packs/day: 0.00    Types: Cigarettes    Quit date: 02/04/2012    Years since quitting: 12.8   Smokeless tobacco: Never  Vaping Use   Vaping status: Never Used  Substance and Sexual Activity   Alcohol use: Yes    Alcohol/week: 1.0 standard drink of alcohol    Types: 1 Standard drinks or equivalent per week    Comment: every other day maybe 1 beer   Drug use: Not Currently   Sexual activity: Not on file  Other Topics Concern   Not on file  Social History Narrative   Not on file   Social Drivers of Health   Tobacco Use: Medium Risk (12/12/2024)   Patient History    Smoking Tobacco Use: Former    Smokeless Tobacco Use: Never    Passive Exposure: Not on file  Financial Resource Strain: Medium Risk (09/21/2024)   Received from [facility]   Overall Financial Resource Strain (CARDIA)    Difficulty of Paying Living Expenses: Somewhat hard  Food Insecurity: Food Insecurity Present (09/21/2024)   Received from  [facility]   Epic    Within the past 12 months, you worried that your food would run out before you got the money to buy more.: Sometimes true    Within the past 12 months, the food you bought just didn't last and you didn't have money to get more.: Often true  Transportation Needs: No Transportation Needs (09/21/2024)   Received from [facility] - Transportation    In the  past 12 months, has lack of transportation kept you from medical appointments or from getting medications?: No    Lack of Transportation (Non-Medical): No  Physical Activity: Insufficiently Active (12/11/2024)   Exercise Vital Sign    Days of Exercise per Week: 2 days    Minutes of Exercise per Session: 20 min  Stress: Stress Concern Present (12/11/2024)   [employer] - Occupational Stress Questionnaire    Feeling of Stress: Rather much  Social Connections: Socially Isolated (12/11/2024)   Social Connection and Isolation Panel    Frequency of Communication with Friends and Family: Once a week    Frequency of Social Gatherings with Friends and Family: Once a week    Attends Religious Services: 1 to 4 times per year    Active Member of Clubs or Organizations: No    Attends Banker Meetings: Never    Marital Status: Never married  Depression (PHQ2-9): High Risk (12/06/2024)   Depression (PHQ2-9)    PHQ-2 Score: 20  Alcohol Screen: High Risk (12/11/2024)   Alcohol Screen    Last Alcohol Screening Score (AUDIT): 27  Housing: Low Risk  (09/21/2024)   [facility]   Epic    In the last 12 months, was there a time when you were not able to pay the mortgage or rent on time?: No    In the past 12 months, how many times have you moved where you were living?: 0    At any time in the past 12 months, were you homeless or living in a shelter (including now)?: No  Utilities: At Risk (09/21/2024)   Received from  [facility]   Epic    In the past 12 months has the electric, gas, oil, or water company threatened to shut off services in your home?: Yes  Health Literacy: Adequate Health Literacy (12/11/2024)   B1300 Health Literacy    Frequency of need for help with medical instructions: Never    Allergies: Allergies[1]  Metabolic Disorder Labs: No results found for: HGBA1C, MPG No results found for: PROLACTIN Lab Results  Component Value Date   CHOL 182 03/24/2018   TRIG 98.0 03/24/2018   HDL 57.90 03/24/2018   CHOLHDL 3 03/24/2018   VLDL 19.6 03/24/2018   LDLCALC 105 (H) 03/24/2018   Lab Results  Component Value Date   TSH 0.68 05/05/2024   TSH 1.46 03/24/2018    Therapeutic Level Labs: No results found for: LITHIUM No results found for: VALPROATE No results found for: CBMZ  Current Medications: Current Outpatient Medications  Medication Sig Dispense Refill   DULoxetine  (CYMBALTA ) 60 MG capsule Take 1 capsule (60 mg total) by mouth daily. 30 capsule 3   albuterol  (VENTOLIN  HFA) 108 (90 Base) MCG/ACT inhaler Inhale 2 puffs into the lungs every 4 (four) hours as needed for wheezing. 18 g 0   baclofen (LIORESAL) 10 MG tablet Take 5 mg by mouth 3 (three) times daily.     buPROPion  (WELLBUTRIN  XL) 300 MG 24 hr tablet Take 1 tablet (300 mg total) by mouth daily. 30 tablet 2   clonazePAM  (KLONOPIN ) 0.5 MG tablet Take 1 tablet (0.5 mg total) by mouth 3 (three) times daily as needed for anxiety. 90 tablet 2   ELMIRON 100 MG capsule Take 2 tablets by mouth at bedtime.   11   fluticasone  (FLONASE ) 50 MCG/ACT nasal spray Place 2 sprays into both nostrils daily.     hydrOXYzine  (ATARAX )  25 MG tablet Take 1 tablet (25 mg total) by mouth every 6 (six) hours as needed. 60 tablet 2   ibuprofen  (ADVIL ) 800 MG tablet Take 1 tablet (800 mg total) by mouth every 8 (eight) hours as needed. 30 tablet 0   Meth-Hyo-M Bl-Benz Acd-Ph Sal (URIBEL PO) Take by mouth.     traZODone   (DESYREL ) 100 MG tablet Take 1 tablet (100 mg total) by mouth at bedtime. 30 tablet 2   No current facility-administered medications for this visit.     Musculoskeletal: Strength & Muscle Tone: na Gait & Station: na Patient leans: N/A  Psychiatric Specialty Exam: Review of Systems  Genitourinary:  Positive for dysuria.  Musculoskeletal:  Positive for arthralgias and myalgias.  All other systems reviewed and are negative.   unknown if currently breastfeeding.There is no height or weight on file to calculate BMI.  General Appearance: NA  Eye Contact:  NA  Speech:  Clear and Coherent  Volume:  Normal  Mood:  Euthymic  Affect:  NA  Thought Process:  Goal Directed  Orientation:  Full (Time, Place, and Person)  Thought Content: WDL   Suicidal Thoughts:  No  Homicidal Thoughts:  No  Memory:  Immediate;   Good Recent;   Good Remote;   NA  Judgement:  Good  Insight:  Good  Psychomotor Activity:  Normal  Concentration:  Concentration: Good and Attention Span: Good  Recall:  Good  Fund of Knowledge: Good  Language: Good  Akathisia:  No  Handed:  Right  AIMS (if indicated): not done  Assets:  Communication Skills Desire for Improvement Resilience Social Support  ADL's:  Intact  Cognition: WNL  Sleep:  Good   Screenings: AUDIT    Advertising Copywriter from 12/11/2024 in [facility]  Alcohol Use Disorder Identification Test Final Score (AUDIT) 27   GAD-7    Flowsheet Row Office Visit from 12/06/2024 in [facility] [place] Counselor from 11/09/2024 in [facility] Office Visit from 05/02/2024 in [facility]  Total GAD-7 Score 13 21 19    [employer]    Flowsheet Row Office Visit from 12/06/2024 in [facility] [place] Counselor from 11/09/2024 in [facility] Counselor  from 05/10/2024 in [facility] Office Visit from 05/02/2024 in [facility] [place] Counselor from 04/05/2024 in [facility]  PHQ-2 Total Score 6 6 6 6 6   PHQ-9 Total Score 20 26 23 22 19    Flowsheet Row Counselor from 11/09/2024 in [facility] Admission (Discharged) from 08/09/2024 in [facility] from 08/03/2024 in [facility]  C-SSRS RISK CATEGORY Moderate Risk No Risk No Risk     Assessment and Plan: This patient is a 39 year old female with a history of posttraumatic stress disorder major depression and generalized anxiety.  She is sleeping better so trazodone  100 mg at bedtime will be continued.  She will continue Wellbutrin  XL 300 mg daily for depression and we will add Cymbalta  60 mg daily to help with mood as well as fibromyalgia.  She will discontinue Abilify  due to weight gain.  She will continue clonazepam  0.5 mg 3 times daily as needed for anxiety and hydroxyzine  25 mg every 6 hours also as needed for anxiety.  She will return to see  me in 4 weeks  Collaboration of Care: Collaboration of Care: Referral or follow-up with counselor/therapist AEB patient will continue therapy in our [place] office  Patient/Guardian was advised Release of Information must be obtained prior to any record release in order to collaborate their care with an outside provider. Patient/Guardian was advised if they have not already done so to contact the registration department to sign all necessary forms in order for us  to release information regarding their care.   Consent: Patient/Guardian gives verbal consent for treatment and assignment of benefits for services provided during this visit. Patient/Guardian expressed understanding and agreed to proceed.    Barnie Gull, MD 12/12/2024, 10:13 AM     [1] No Known  Allergies

## 2024-12-19 ENCOUNTER — Ambulatory Visit: Payer: MEDICAID

## 2024-12-27 ENCOUNTER — Ambulatory Visit (INDEPENDENT_AMBULATORY_CARE_PROVIDER_SITE_OTHER): Payer: MEDICAID

## 2024-12-27 DIAGNOSIS — F12220 Cannabis dependence with intoxication, uncomplicated: Secondary | ICD-10-CM

## 2024-12-27 DIAGNOSIS — F4310 Post-traumatic stress disorder, unspecified: Secondary | ICD-10-CM | POA: Diagnosis not present

## 2024-12-27 DIAGNOSIS — F10220 Alcohol dependence with intoxication, uncomplicated: Secondary | ICD-10-CM

## 2024-12-27 DIAGNOSIS — F411 Generalized anxiety disorder: Secondary | ICD-10-CM

## 2024-12-27 DIAGNOSIS — F1424 Cocaine dependence with cocaine-induced mood disorder: Secondary | ICD-10-CM
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
## 2025-01-02 ENCOUNTER — Ambulatory Visit: Payer: MEDICAID
